# Patient Record
Sex: Female | Born: 1945 | Race: White | Hispanic: No | Marital: Married | Smoking: Never smoker
Health system: Southern US, Community
[De-identification: ages and names within clinical notes are randomized; demographics above are authoritative.]

## PROBLEM LIST (undated history)

## (undated) DIAGNOSIS — F419 Anxiety disorder, unspecified: Secondary | ICD-10-CM

## (undated) DIAGNOSIS — K529 Noninfective gastroenteritis and colitis, unspecified: Secondary | ICD-10-CM

## (undated) DIAGNOSIS — Z5189 Encounter for other specified aftercare: Secondary | ICD-10-CM

## (undated) DIAGNOSIS — K449 Diaphragmatic hernia without obstruction or gangrene: Secondary | ICD-10-CM

## (undated) DIAGNOSIS — E785 Hyperlipidemia, unspecified: Secondary | ICD-10-CM

## (undated) DIAGNOSIS — K219 Gastro-esophageal reflux disease without esophagitis: Secondary | ICD-10-CM

## (undated) DIAGNOSIS — C44300 Unspecified malignant neoplasm of skin of unspecified part of face: Secondary | ICD-10-CM

## (undated) DIAGNOSIS — L309 Dermatitis, unspecified: Secondary | ICD-10-CM

## (undated) DIAGNOSIS — K635 Polyp of colon: Secondary | ICD-10-CM

## (undated) DIAGNOSIS — L719 Rosacea, unspecified: Secondary | ICD-10-CM

## (undated) DIAGNOSIS — K5090 Crohn's disease, unspecified, without complications: Secondary | ICD-10-CM

## (undated) DIAGNOSIS — M1990 Unspecified osteoarthritis, unspecified site: Secondary | ICD-10-CM

## (undated) DIAGNOSIS — K5790 Diverticulosis of intestine, part unspecified, without perforation or abscess without bleeding: Secondary | ICD-10-CM

## (undated) DIAGNOSIS — T7840XA Allergy, unspecified, initial encounter: Secondary | ICD-10-CM

## (undated) DIAGNOSIS — G43909 Migraine, unspecified, not intractable, without status migrainosus: Secondary | ICD-10-CM

## (undated) DIAGNOSIS — I10 Essential (primary) hypertension: Secondary | ICD-10-CM

## (undated) DIAGNOSIS — K2970 Gastritis, unspecified, without bleeding: Secondary | ICD-10-CM

## (undated) DIAGNOSIS — M109 Gout, unspecified: Secondary | ICD-10-CM

## (undated) HISTORY — DX: Unspecified malignant neoplasm of skin of unspecified part of face: C44.300

## (undated) HISTORY — DX: Noninfective gastroenteritis and colitis, unspecified: K52.9

## (undated) HISTORY — DX: Diverticulosis of intestine, part unspecified, without perforation or abscess without bleeding: K57.90

## (undated) HISTORY — PX: POLYPECTOMY: SHX149

## (undated) HISTORY — DX: Rosacea, unspecified: L71.9

## (undated) HISTORY — DX: Anxiety disorder, unspecified: F41.9

## (undated) HISTORY — DX: Encounter for other specified aftercare: Z51.89

## (undated) HISTORY — DX: Gastritis, unspecified, without bleeding: K29.70

## (undated) HISTORY — DX: Hyperlipidemia, unspecified: E78.5

## (undated) HISTORY — DX: Dermatitis, unspecified: L30.9

## (undated) HISTORY — DX: Polyp of colon: K63.5

## (undated) HISTORY — DX: Gout, unspecified: M10.9

## (undated) HISTORY — DX: Crohn's disease, unspecified, without complications: K50.90

## (undated) HISTORY — PX: WISDOM TOOTH EXTRACTION: SHX21

## (undated) HISTORY — DX: Migraine, unspecified, not intractable, without status migrainosus: G43.909

## (undated) HISTORY — DX: Allergy, unspecified, initial encounter: T78.40XA

## (undated) HISTORY — DX: Essential (primary) hypertension: I10

## (undated) HISTORY — DX: Diaphragmatic hernia without obstruction or gangrene: K44.9

## (undated) HISTORY — PX: UPPER GI ENDOSCOPY: SHX6162

## (undated) HISTORY — DX: Unspecified osteoarthritis, unspecified site: M19.90

## (undated) HISTORY — PX: TONSILLECTOMY: SUR1361

## (undated) HISTORY — DX: Gastro-esophageal reflux disease without esophagitis: K21.9

## (undated) HISTORY — PX: DILATION AND CURETTAGE OF UTERUS: SHX78

## (undated) HISTORY — PX: BREAST EXCISIONAL BIOPSY: SUR124

## (undated) HISTORY — PX: UPPER GASTROINTESTINAL ENDOSCOPY: SHX188

---

## 1978-04-19 HISTORY — PX: BREAST BIOPSY: SHX20

## 1992-04-19 HISTORY — PX: TOTAL VAGINAL HYSTERECTOMY: SHX2548

## 1997-07-24 ENCOUNTER — Ambulatory Visit (HOSPITAL_COMMUNITY): Admission: RE | Admit: 1997-07-24 | Discharge: 1997-07-24 | Payer: Self-pay | Admitting: Obstetrics and Gynecology

## 1998-07-28 ENCOUNTER — Ambulatory Visit (HOSPITAL_COMMUNITY): Admission: RE | Admit: 1998-07-28 | Discharge: 1998-07-28 | Payer: Self-pay | Admitting: Obstetrics and Gynecology

## 1999-08-12 ENCOUNTER — Ambulatory Visit (HOSPITAL_COMMUNITY): Admission: RE | Admit: 1999-08-12 | Discharge: 1999-08-12 | Payer: Self-pay | Admitting: Obstetrics and Gynecology

## 1999-08-12 ENCOUNTER — Encounter: Payer: Self-pay | Admitting: Obstetrics and Gynecology

## 2000-08-15 ENCOUNTER — Ambulatory Visit (HOSPITAL_COMMUNITY): Admission: RE | Admit: 2000-08-15 | Discharge: 2000-08-15 | Payer: Self-pay | Admitting: Obstetrics and Gynecology

## 2000-08-15 ENCOUNTER — Encounter: Payer: Self-pay | Admitting: Obstetrics and Gynecology

## 2000-11-14 ENCOUNTER — Other Ambulatory Visit: Admission: RE | Admit: 2000-11-14 | Discharge: 2000-11-14 | Payer: Self-pay | Admitting: Obstetrics and Gynecology

## 2001-08-23 ENCOUNTER — Encounter: Payer: Self-pay | Admitting: Obstetrics and Gynecology

## 2001-08-23 ENCOUNTER — Ambulatory Visit (HOSPITAL_COMMUNITY): Admission: RE | Admit: 2001-08-23 | Discharge: 2001-08-23 | Payer: Self-pay | Admitting: Obstetrics and Gynecology

## 2002-09-04 ENCOUNTER — Encounter: Payer: Self-pay | Admitting: Obstetrics and Gynecology

## 2002-09-04 ENCOUNTER — Ambulatory Visit (HOSPITAL_COMMUNITY): Admission: RE | Admit: 2002-09-04 | Discharge: 2002-09-04 | Payer: Self-pay | Admitting: Obstetrics and Gynecology

## 2003-09-19 ENCOUNTER — Ambulatory Visit (HOSPITAL_COMMUNITY): Admission: RE | Admit: 2003-09-19 | Discharge: 2003-09-19 | Payer: Self-pay | Admitting: Obstetrics and Gynecology

## 2004-03-05 ENCOUNTER — Ambulatory Visit: Payer: Self-pay | Admitting: Family Medicine

## 2004-04-17 ENCOUNTER — Ambulatory Visit: Payer: Self-pay | Admitting: Family Medicine

## 2004-07-24 ENCOUNTER — Ambulatory Visit: Payer: Self-pay | Admitting: Family Medicine

## 2004-08-04 ENCOUNTER — Ambulatory Visit: Payer: Self-pay | Admitting: Gastroenterology

## 2004-09-23 ENCOUNTER — Ambulatory Visit (HOSPITAL_COMMUNITY): Admission: RE | Admit: 2004-09-23 | Discharge: 2004-09-23 | Payer: Self-pay | Admitting: Obstetrics and Gynecology

## 2004-10-12 ENCOUNTER — Ambulatory Visit: Payer: Self-pay | Admitting: Internal Medicine

## 2005-01-20 ENCOUNTER — Ambulatory Visit: Payer: Self-pay | Admitting: Internal Medicine

## 2005-01-27 ENCOUNTER — Ambulatory Visit: Payer: Self-pay | Admitting: Internal Medicine

## 2005-07-08 ENCOUNTER — Ambulatory Visit: Payer: Self-pay | Admitting: Gastroenterology

## 2005-08-16 ENCOUNTER — Ambulatory Visit: Payer: Self-pay | Admitting: Gastroenterology

## 2005-08-16 ENCOUNTER — Encounter (INDEPENDENT_AMBULATORY_CARE_PROVIDER_SITE_OTHER): Payer: Self-pay | Admitting: Specialist

## 2005-09-24 ENCOUNTER — Ambulatory Visit (HOSPITAL_COMMUNITY): Admission: RE | Admit: 2005-09-24 | Discharge: 2005-09-24 | Payer: Self-pay | Admitting: Obstetrics & Gynecology

## 2006-04-27 ENCOUNTER — Ambulatory Visit: Payer: Self-pay | Admitting: Internal Medicine

## 2006-04-27 LAB — CONVERTED CEMR LAB
Albumin: 4.2 g/dL (ref 3.5–5.2)
BUN: 9 mg/dL (ref 6–23)
CO2: 32 meq/L (ref 19–32)
Calcium: 9.3 mg/dL (ref 8.4–10.5)
Cholesterol: 160 mg/dL (ref 0–200)
Creatinine, Ser: 1 mg/dL (ref 0.4–1.2)
Eosinophil percent: 1.1 % (ref 0.0–5.0)
Glomerular Filtration Rate, Af Am: 72 mL/min/{1.73_m2}
HCT: 41 % (ref 36.0–46.0)
Hemoglobin: 13.8 g/dL (ref 12.0–15.0)
LDL Cholesterol: 91 mg/dL (ref 0–99)
Lymphocytes Relative: 20 % (ref 12.0–46.0)
MCHC: 33.5 g/dL (ref 30.0–36.0)
MCV: 93.8 fL (ref 78.0–100.0)
Monocytes Absolute: 0.6 10*3/uL (ref 0.2–0.7)
Monocytes Relative: 11.3 % — ABNORMAL HIGH (ref 3.0–11.0)
Platelets: 251 10*3/uL (ref 150–400)
Sodium: 145 meq/L (ref 135–145)
TSH: 2.15 microintl units/mL (ref 0.35–5.50)
Total Bilirubin: 0.8 mg/dL (ref 0.3–1.2)
Total Protein: 7.3 g/dL (ref 6.0–8.3)
Triglyceride fasting, serum: 118 mg/dL (ref 0–149)
VLDL: 24 mg/dL (ref 0–40)

## 2006-05-04 ENCOUNTER — Ambulatory Visit: Payer: Self-pay | Admitting: Internal Medicine

## 2006-08-02 ENCOUNTER — Ambulatory Visit: Payer: Self-pay | Admitting: Gastroenterology

## 2006-09-26 ENCOUNTER — Ambulatory Visit (HOSPITAL_COMMUNITY): Admission: RE | Admit: 2006-09-26 | Discharge: 2006-09-26 | Payer: Self-pay | Admitting: Obstetrics & Gynecology

## 2006-09-29 ENCOUNTER — Encounter: Admission: RE | Admit: 2006-09-29 | Discharge: 2006-09-29 | Payer: Self-pay | Admitting: Obstetrics & Gynecology

## 2006-12-19 LAB — CONVERTED CEMR LAB: Pap Smear: NORMAL

## 2007-04-05 ENCOUNTER — Telehealth: Payer: Self-pay | Admitting: Internal Medicine

## 2007-06-08 ENCOUNTER — Ambulatory Visit: Payer: Self-pay | Admitting: Internal Medicine

## 2007-06-08 LAB — CONVERTED CEMR LAB
Bilirubin Urine: NEGATIVE
Blood in Urine, dipstick: NEGATIVE
Specific Gravity, Urine: 1.015

## 2007-06-09 LAB — CONVERTED CEMR LAB
ALT: 33 units/L (ref 0–35)
AST: 29 units/L (ref 0–37)
Albumin: 4.2 g/dL (ref 3.5–5.2)
Basophils Absolute: 0 10*3/uL (ref 0.0–0.1)
Basophils Relative: 0 % (ref 0.0–1.0)
Calcium: 9.6 mg/dL (ref 8.4–10.5)
Cholesterol: 173 mg/dL (ref 0–200)
Eosinophils Absolute: 0.1 10*3/uL (ref 0.0–0.6)
GFR calc Af Amer: 82 mL/min
HDL: 49.5 mg/dL (ref >39.0)
Hemoglobin: 14 g/dL (ref 12.0–15.0)
LDL Cholesterol: 112 mg/dL — ABNORMAL HIGH (ref 0–99)
MCV: 95.1 fL (ref 78.0–100.0)
Monocytes Absolute: 0.5 10*3/uL (ref 0.2–0.7)
Monocytes Relative: 8.7 % (ref 3.0–11.0)
Neutrophils Relative %: 61.5 % (ref 43.0–77.0)
Platelets: 215 10*3/uL (ref 150–400)
Potassium: 4.3 meq/L (ref 3.5–5.1)
TSH: 2.24 microintl units/mL (ref 0.35–5.50)
Total Bilirubin: 0.9 mg/dL (ref 0.3–1.2)
Total Protein: 7.1 g/dL (ref 6.0–8.3)
Triglycerides: 59 mg/dL (ref 0–149)

## 2007-07-05 ENCOUNTER — Ambulatory Visit: Payer: Self-pay | Admitting: Internal Medicine

## 2007-07-05 DIAGNOSIS — E785 Hyperlipidemia, unspecified: Secondary | ICD-10-CM | POA: Insufficient documentation

## 2007-07-05 DIAGNOSIS — K219 Gastro-esophageal reflux disease without esophagitis: Secondary | ICD-10-CM

## 2007-07-05 DIAGNOSIS — G43909 Migraine, unspecified, not intractable, without status migrainosus: Secondary | ICD-10-CM | POA: Insufficient documentation

## 2007-07-05 DIAGNOSIS — K5090 Crohn's disease, unspecified, without complications: Secondary | ICD-10-CM | POA: Insufficient documentation

## 2007-07-05 DIAGNOSIS — L259 Unspecified contact dermatitis, unspecified cause: Secondary | ICD-10-CM

## 2007-07-05 DIAGNOSIS — J309 Allergic rhinitis, unspecified: Secondary | ICD-10-CM | POA: Insufficient documentation

## 2007-07-07 ENCOUNTER — Telehealth: Payer: Self-pay | Admitting: Internal Medicine

## 2007-08-10 ENCOUNTER — Ambulatory Visit: Payer: Self-pay | Admitting: Gastroenterology

## 2007-09-28 ENCOUNTER — Encounter: Admission: RE | Admit: 2007-09-28 | Discharge: 2007-09-28 | Payer: Self-pay | Admitting: Obstetrics & Gynecology

## 2008-07-22 DIAGNOSIS — K449 Diaphragmatic hernia without obstruction or gangrene: Secondary | ICD-10-CM | POA: Insufficient documentation

## 2008-07-23 ENCOUNTER — Ambulatory Visit: Payer: Self-pay | Admitting: Gastroenterology

## 2008-09-30 ENCOUNTER — Encounter: Admission: RE | Admit: 2008-09-30 | Discharge: 2008-09-30 | Payer: Self-pay | Admitting: Obstetrics & Gynecology

## 2008-10-07 ENCOUNTER — Ambulatory Visit: Payer: Self-pay | Admitting: Internal Medicine

## 2008-10-07 LAB — CONVERTED CEMR LAB
ALT: 24 units/L (ref 0–35)
Alkaline Phosphatase: 84 units/L (ref 39–117)
BUN: 26 mg/dL — ABNORMAL HIGH (ref 6–23)
Bilirubin Urine: NEGATIVE
Bilirubin, Direct: 0.1 mg/dL (ref 0.0–0.3)
CO2: 32 meq/L (ref 19–32)
Cholesterol: 144 mg/dL (ref 0–200)
Eosinophils Relative: 2.6 % (ref 0.0–5.0)
Glucose, Urine, Semiquant: NEGATIVE
HCT: 38.4 % (ref 36.0–46.0)
HDL: 50.2 mg/dL (ref >39.00)
Hemoglobin: 13.4 g/dL (ref 12.0–15.0)
Lymphs Abs: 1.3 10*3/uL (ref 0.7–4.0)
MCHC: 34.8 g/dL (ref 30.0–36.0)
MCV: 94.2 fL (ref 78.0–100.0)
Neutro Abs: 3.6 10*3/uL (ref 1.4–7.7)
Neutrophils Relative %: 62.2 % (ref 43.0–77.0)
Potassium: 4.9 meq/L (ref 3.5–5.1)
RBC: 4.08 M/uL (ref 3.87–5.11)
RDW: 11.7 % (ref 11.5–14.6)
Specific Gravity, Urine: 1.01
TSH: 3.52 microintl units/mL (ref 0.35–5.50)
Total Bilirubin: 0.9 mg/dL (ref 0.3–1.2)
WBC: 5.7 10*3/uL (ref 4.5–10.5)
pH: 7

## 2008-10-14 ENCOUNTER — Ambulatory Visit: Payer: Self-pay | Admitting: Internal Medicine

## 2008-10-14 DIAGNOSIS — G479 Sleep disorder, unspecified: Secondary | ICD-10-CM | POA: Insufficient documentation

## 2008-10-19 DIAGNOSIS — R519 Headache, unspecified: Secondary | ICD-10-CM | POA: Insufficient documentation

## 2008-10-19 DIAGNOSIS — R51 Headache: Secondary | ICD-10-CM

## 2008-12-24 ENCOUNTER — Encounter: Payer: Self-pay | Admitting: Internal Medicine

## 2009-07-17 ENCOUNTER — Ambulatory Visit: Payer: Self-pay | Admitting: Gastroenterology

## 2009-08-27 ENCOUNTER — Telehealth: Payer: Self-pay | Admitting: Gastroenterology

## 2009-08-29 ENCOUNTER — Encounter: Payer: Self-pay | Admitting: Gastroenterology

## 2009-10-02 ENCOUNTER — Encounter: Admission: RE | Admit: 2009-10-02 | Discharge: 2009-10-02 | Payer: Self-pay | Admitting: Obstetrics & Gynecology

## 2009-10-10 ENCOUNTER — Ambulatory Visit: Payer: Self-pay | Admitting: Internal Medicine

## 2009-10-10 LAB — CONVERTED CEMR LAB
BUN: 19 mg/dL (ref 6–23)
Basophils Relative: 1 % (ref 0.0–3.0)
Blood in Urine, dipstick: NEGATIVE
Chloride: 103 meq/L (ref 96–112)
Cholesterol: 152 mg/dL (ref 0–200)
Creatinine, Ser: 0.8 mg/dL (ref 0.4–1.2)
Eosinophils Absolute: 0.1 10*3/uL (ref 0.0–0.7)
Eosinophils Relative: 2.1 % (ref 0.0–5.0)
GFR calc non Af Amer: 80.08 mL/min (ref >60)
Glucose, Bld: 87 mg/dL (ref 70–99)
Glucose, Urine, Semiquant: NEGATIVE
HCT: 41.2 % (ref 36.0–46.0)
HDL: 44.3 mg/dL (ref >39.00)
Lymphs Abs: 1.6 10*3/uL (ref 0.7–4.0)
MCHC: 34.3 g/dL (ref 30.0–36.0)
MCV: 95.8 fL (ref 78.0–100.0)
Monocytes Relative: 9.4 % (ref 3.0–12.0)
Neutro Abs: 3.3 10*3/uL (ref 1.4–7.7)
Neutrophils Relative %: 59.7 % (ref 43.0–77.0)
Nitrite: NEGATIVE
TSH: 2.53 microintl units/mL (ref 0.35–5.50)
Total Protein: 6.9 g/dL (ref 6.0–8.3)
Triglycerides: 87 mg/dL (ref 0.0–149.0)
VLDL: 17.4 mg/dL (ref 0.0–40.0)
WBC: 5.6 10*3/uL (ref 4.5–10.5)
pH: 6.5

## 2009-10-17 ENCOUNTER — Ambulatory Visit: Payer: Self-pay | Admitting: Internal Medicine

## 2009-10-28 ENCOUNTER — Encounter: Payer: Self-pay | Admitting: Internal Medicine

## 2009-10-29 ENCOUNTER — Ambulatory Visit: Payer: Self-pay | Admitting: Internal Medicine

## 2009-10-29 DIAGNOSIS — H9319 Tinnitus, unspecified ear: Secondary | ICD-10-CM | POA: Insufficient documentation

## 2009-11-05 ENCOUNTER — Telehealth: Payer: Self-pay | Admitting: *Deleted

## 2009-11-05 ENCOUNTER — Telehealth (INDEPENDENT_AMBULATORY_CARE_PROVIDER_SITE_OTHER): Payer: Self-pay | Admitting: *Deleted

## 2009-11-10 ENCOUNTER — Telehealth: Payer: Self-pay | Admitting: *Deleted

## 2009-11-11 ENCOUNTER — Telehealth: Payer: Self-pay | Admitting: *Deleted

## 2009-11-12 ENCOUNTER — Telehealth: Payer: Self-pay | Admitting: *Deleted

## 2010-02-11 ENCOUNTER — Telehealth: Payer: Self-pay | Admitting: *Deleted

## 2010-05-10 ENCOUNTER — Encounter: Payer: Self-pay | Admitting: Obstetrics & Gynecology

## 2010-05-19 NOTE — Progress Notes (Signed)
Summary: Refill   Phone Note From Pharmacy   Summary of Call: Refill req from [facility] for Pentasa Initial call taken by: Ashok Cordia RN,  November 05, 2009 11:54 AM    Prescriptions: PENTASA 500 MG CPCR (MESALAMINE) 1 capsule by mouth twice a day  #180 x 4   Entered by:   Ashok Cordia RN   Authorized by:   Mardella Layman MD [facility]   Signed by:   Ashok Cordia RN on 11/05/2009   Method used:   Faxed to ...       [facility] (mail-order)             , [place]         Ph: [phone]       Fax: [phone]   RxID:   [id number]

## 2010-05-19 NOTE — Progress Notes (Signed)
Summary: rx refill  Phone Note Call from Patient   Caller: Patient Call For: Madelin Headings MD Reason for Call: Refill Medication Summary of Call: patient is calling because [facility] has not yet received rx.   Initial call taken by: Kern Reap CMA Duncan Dull),  November 10, 2009 3:00 PM  Follow-up for Phone Call        We faxed them on 7/21. Pt aware of this. Follow-up by: Romualdo Bolk, CMA Duncan Dull),  November 10, 2009 3:45 PM

## 2010-05-19 NOTE — Assessment & Plan Note (Signed)
Summary: cpx/cjr   Vital Signs:  Patient profile:   65 year old female Menstrual status:  hysterectomy Height:      66 inches Weight:      149 pounds Pulse rate:   88 / minute BP sitting:   150 / 72  (right arm) Cuff size:   regular  Vitals Entered By: Sherron Monday, CMA (AAMA) (October 17, 2009 8:43 AM)  Serial Vital Signs/Assessments:  Time      Position  BP       Pulse  Resp  Temp     By                     130/70                         Burnis Medin MD  Comments: right arm sitting By: Burnis Medin MD   CC: CPX without pap. Pt has gyn.   History of Present Illness: Bridget Reyes comes in today  for preventive visit  Since last visit  here  there have been no major changes in health status  .  Sleep: dr Earley Favor gave her Lorrin Mais    but new HA doc   wants PCP to prescribe .   LIPIDs: no se of meds  HAs  TCA   one  hs dry mouth    Crohns disease   yearly. check s currently .    Preventive Care Screening  Mammogram:    Date:  10/02/2009    Results:  normal   Prior Values:    Pap Smear:  normal (03/19/2008)    Mammogram:  normal (09/17/2008)    Colonoscopy:  Normal (08/16/2005)    Last Tetanus Booster:  Historical (10/18/2003)   Preventive Screening-Counseling & Management  Alcohol-Tobacco     Alcohol drinks/day: <1     Alcohol type: wine and vodka     Smoking Status: never     Passive Smoke Exposure: no  Caffeine-Diet-Exercise     Caffeine use/day: 0     Does Patient Exercise: yes     Type of exercise: curves     Times/week: 4  Hep-HIV-STD-Contraception     Dental Visit-last 6 months yes     Sun Exposure-Excessive: no  Safety-Violence-Falls     Seat Belt Use: yes     Firearms in the Home: firearms in the home     Firearm Counseling: not indicated; uses recommended firearm safety measures     Smoke Detectors: yes      Drug Use:  no.    Current Medications (verified): 1)  Claritin 10 Mg Tabs (Loratadine) .... Take 1 Tablet By Mouth Once A  Day As Needed 2)  Ambien 10 Mg Tabs (Zolpidem Tartrate) .... Take One By Mouth At Bedtime As Needed 3)  Estring 2 Mg Ring (Estradiol) .... Insert New Ring Every 3 Months 4)  Flexeril 10 Mg Tabs (Cyclobenzaprine Hcl) .... Take As Needed 5)  Flonase 50 Mcg/act Susp (Fluticasone Propionate) .... 2 Sprays Each Nares Q D 6)  Imitrex 100 Mg Tabs (Sumatriptan Succinate) .... Take One By Mouth As Needed 7)  Lidex 0.05 % Crea (Fluocinonide) .... Use As Directed Prn 8)  Pentasa 500 Mg Cpcr (Mesalamine) .Marland Kitchen.. 1 Capsule By Mouth Twice A Day 9)  Zocor 40 Mg Tabs (Simvastatin) .... Take 1 Tablet By Mouth Once A Day 10)  Atrovent 0.06 %  Soln (Ipratropium Bromide) .... 2sprays  Each Nares 2-3 X Per Day 11)  Multivitamins  Tabs (Multiple Vitamin) .... Take 1 Tablet By Mouth Once A Day 12)  Fish Oil  Oil (Fish Oil) .... Take One By Mouth Once Daily 13)  Amitriptyline Hcl 25 Mg Tabs (Amitriptyline Hcl) .... Take 1 Tabs By Mouth At Bedtime 14)  Melatonin 3 Mg Tabs (Melatonin) .... Take 1 Tab By Mouth At Bedtime 15)  [place]ips Colon Health  Caps (Probiotic Product) .... Take One By Mouth Once Daily 16)  Dexilant 60 Mg Cpdr (Dexlansoprazole) .Marland Kitchen.. 1 By Mouth Qd  Allergies (verified): 1)  ! Penicillin 2)  Bactrim 3)  Amoxicillin (Amoxicillin)  Past History:  Past medical, surgical, family and social histories (including risk factors) reviewed, and no changes noted (except as noted below).  Past Medical History: Reviewed history from 10/14/2008 and no changes required. GERD Hiatal Hernia Hyperlipidemia G2P2 Headache Migraine  Specialty care Eczema Crohns disease  ileal Allergic rhinitis Sleep problem  Past Surgical History: Reviewed history from 10/14/2008 and no changes required. Hysterectomy and bilateral Oophorectomy 89 Tonsillectomy 50 Breast Bx- Left 19880s  Past History:  Care Management: Gynecology: [facility] Gastroenterology: [place]  Family History: Reviewed  history from 10/14/2008 and no changes required. mom parkinsons   lung cancer  Father   cad   19s died in 29s chf Family History Hypertension Maternal aunt osteoporosis Daughter underactive  thyroid.   Sibs A&w  Family History of Breast Cancer: Pat Cousin No FH of Colon Cancer: Sister:   PAD     Social History: Reviewed history from 10/14/2008 and no changes required. Married, 2 girls  hhof 2  no pet Never Smoked Alcohol use-yes  social  Drug use-no Regular exercise-yes Daily Caffeine Use 2 cups coffee retired Education officer, [employer].  Review of Systems  The patient denies anorexia, fever, weight loss, weight gain, vision loss, hoarseness, chest pain, syncope, dyspnea on exertion, peripheral edema, prolonged cough, hemoptysis, abdominal pain, melena, hematochezia, severe indigestion/heartburn, hematuria, incontinence, muscle weakness, transient blindness, difficulty walking, depression, unusual weight change, abnormal bleeding, enlarged lymph nodes, angioedema, and breast masses.         some decrease    checked by ent in past for tinnitus and ok  Physical Exam General Appearance: well developed, well nourished, no acute distress Eyes: conjunctiva and lids normal, PERRLA, EOMI, WNL Ears, Nose, Mouth, Throat: TM clear, nares clear, oral exam WNL Neck: supple, no lymphadenopathy, no thyromegaly, no JVD Respiratory: clear to auscultation and percussion, respiratory effort normal Cardiovascular: regular rate and rhythm, S1-S2, no murmur, rub or gallop, no bruits, peripheral pulses normal and symmetric, no cyanosis, clubbing, edema or varicosities Chest: no scars, masses, tenderness; no asymmetry, skin changes, nipple discharge   Gastrointestinal: soft, non-tender; no hepatosplenomegaly, masses; active bowel sounds all quadrants,  Genitourinary: hyst  per gyne Lymphatic: no cervical, axillary or inguinal adenopathy Musculoskeletal: gait normal, muscle tone and strength WNL, no joint swelling,  effusions, discoloration, crepitus  Skin: clear, good turgor, color WNL, no rashes, lesions, or ulcerations Neurologic: normal mental status, normal reflexes, normal strength, sensation, and motion Psychiatric: alert; oriented to person, place and time Other Exam:  see labs normal   EKG sinus rhythm  slight  ns st junct depression  no acute changes .    review of old EKG show no sig change.     Impression & Recommendations:  Problem # 1:  PREVENTIVE HEALTH CARE (ICD-V70.0)  continue healthy lifestyle   zostavax NA today but  get when  avaialbe.      Orders: EKG w/ Interpretation (93000)  Problem # 2:  SLEEP DISORDER/DISTURBANCE (ICD-780.50) problematic   Discussed risk benefit  for ambien    will refill   Problem # 3:  HEADACHE (ICD-784.0) under specialty care  dry mouth from tca    Her updated medication list for this problem includes:    Imitrex 100 Mg Tabs (Sumatriptan succinate) .Marland Kitchen... Take one by mouth as needed  Problem # 4:  ALLERGIC RHINITIS (ICD-477.9) Assessment: Unchanged  Her updated medication list for this problem includes:    Claritin 10 Mg Tabs (Loratadine) .Marland Kitchen... Take 1 tablet by mouth once a day as needed    Flonase 50 Mcg/act Susp (Fluticasone propionate) .Marland Kitchen... 2 sprays each nares q d    Atrovent 0.06 % Soln (Ipratropium bromide) .Marland Kitchen... 2sprays each nares 2-3 x per day  Problem # 5:  HYPERLIPIDEMIA (ICD-272.4) Assessment: Unchanged  Her updated medication list for this problem includes:    Zocor 40 Mg Tabs (Simvastatin) .Marland Kitchen... Take 1 tablet by mouth once a day  Labs Reviewed: SGOT: 22 (10/10/2009)   SGPT: 24 (10/10/2009)   HDL:44.30 (10/10/2009), 50.20 (10/07/2008)  LDL:90 (10/10/2009), 86 (10/07/2008)  Chol:152 (10/10/2009), 144 (10/07/2008)  Trig:87.0 (10/10/2009), 39.0 (10/07/2008)  Orders: EKG w/ Interpretation (93000)  Problem # 6:  GERD (ICD-530.81)  per gi The following medications were removed from the medication list:    Omeprazole 20 Mg Cpdr  (Omeprazole) .Marland Kitchen... 1 each day 30 minutes before meal Her updated medication list for this problem includes:    Dexilant 60 Mg Cpdr (Dexlansoprazole) .Marland Kitchen... 1 by mouth qd  Diagnostics Reviewed:  EGD: Location: [place]   (08/16/2005) Discussed lifestyle modifications, diet, antacids/medications, and preventive measures. Handout provided.   Problem # 7:  CROHN'S DISEASE (ICD-555.9) Assessment: Comment Only  Complete Medication List: 1)  Claritin 10 Mg Tabs (Loratadine) .... Take 1 tablet by mouth once a day as needed 2)  Ambien 10 Mg Tabs (Zolpidem tartrate) .... Take one by mouth at bedtime as needed 3)  Estring 2 Mg Ring (Estradiol) .... Insert new ring every 3 months 4)  Flexeril 10 Mg Tabs (Cyclobenzaprine hcl) .... Take as needed 5)  Flonase 50 Mcg/act Susp (Fluticasone propionate) .... 2 sprays each nares q d 6)  Imitrex 100 Mg Tabs (Sumatriptan succinate) .... Take one by mouth as needed 7)  Lidex 0.05 % Crea (Fluocinonide) .... Use as directed prn 8)  Pentasa 500 Mg Cpcr (Mesalamine) .Marland Kitchen.. 1 capsule by mouth twice a day 9)  Zocor 40 Mg Tabs (Simvastatin) .... Take 1 tablet by mouth once a day 10)  Atrovent 0.06 % Soln (Ipratropium bromide) .... 2sprays each nares 2-3 x per day 11)  Multivitamins Tabs (Multiple vitamin) .... Take 1 tablet by mouth once a day 12)  Fish Oil Oil (Fish oil) .... Take one by mouth once daily 13)  Amitriptyline Hcl 25 Mg Tabs (Amitriptyline hcl) .... Take 1 tabs by mouth at bedtime 14)  Melatonin 3 Mg Tabs (Melatonin) .... Take 1 tab by mouth at bedtime 15)  [place] (Probiotic product) .... Take one by mouth once daily 16)  Dexilant 60 Mg Cpdr (Dexlansoprazole) .Marland Kitchen.. 1 by mouth qd  Patient Instructions: 1)  check BP readings    more frequently  in the next 2 weeks  and call if elevated . 2)  130/70 is your repeat reading today . 3)  contniue med s  as directed ' 4)  Ask headache  doc if  amitryptyline can be switched to  a different med  in class that could have less dry mouth.  5)  check up and labs in a year . 6)  call as needed .  7)  Can try  ambien 2-3 days in a row if needed.  Prescriptions: AMBIEN 10 MG TABS (ZOLPIDEM TARTRATE) take one by mouth at bedtime as needed  #30 x 2   Entered and Authorized by:   Burnis Medin MD   Signed by:   Burnis Medin MD on 10/17/2009   Method used:   Print then Give to Patient   RxID:   [id number] FLONASE 50 MCG/ACT SUSP (FLUTICASONE PROPIONATE) 2 sprays each nares q d  #3 months x 3   Entered and Authorized by:   Burnis Medin MD   Signed by:   Burnis Medin MD on 10/17/2009   Method used:   Electronically to        [facility]. [phone]* (retail)       [address]       [place], [place]  [address]       Ph: [phone]       Fax: [phone]   RxID:   [id number] ATROVENT 0.06 %  SOLN (IPRATROPIUM BROMIDE) 2sprays each nares 2-3 x per day  #3 x 3   Entered and Authorized by:   Burnis Medin MD   Signed by:   Burnis Medin MD on 10/17/2009   Method used:   Electronically to        [facility]. [phone]* (retail)       [address]       [place], [place]  [address]       Ph: [phone]       Fax: [phone]   RxID:   [id number] ZOCOR 40 MG TABS (SIMVASTATIN) Take 1 tablet by mouth once a day  #90 Tablet x 3   Entered and Authorized by:   Burnis Medin MD   Signed by:   Burnis Medin MD on 10/17/2009   Method used:   Electronically to        [facility]. [phone]* (retail)       [address]       [place], [place]  [address]       Ph: [phone]       Fax: [phone]   RxID:   [id number] LIDEX 0.05 % CREA (FLUOCINONIDE) use as directed prn  #30 grams x 2   Entered and Authorized by:   Burnis Medin MD   Signed by:   Burnis Medin MD on 10/17/2009   Method used:   Electronically to        [facility]. [phone]* (retail)       [address]       [place],   [address]       Ph: [phone]       Fax: [phone]   RxID:   [id number]

## 2010-05-19 NOTE — Therapy (Signed)
Summary: Hearing Test/[facility]  Hearing Test/[facility]   Imported By: Maryln Gottron 11/03/2009 12:23:19  _____________________________________________________________________  External Attachment:    Type:   Image     Comment:   External Document

## 2010-05-19 NOTE — Progress Notes (Signed)
Summary: Switching to [facility]  Phone Note Call from Patient Call back at [facility] Phone [phone]   Caller: Patient Summary of Call: FYI:  Insurance company asked me to call and inform you that I will be changing prescriiption carrier to [facility].  They will be sending the necessary information to you. Initial call taken by: Trixie Dredge,  November 05, 2009 9:15 AM

## 2010-05-19 NOTE — Progress Notes (Signed)
Summary: refill on ambien  Phone Note From Pharmacy   Caller: [facility] Reason for Call: Needs renewal Details for Reason: ambien 34m Initial call taken by: [place]herron [place](AAMA),  February 11, 2010 11:56 AM  Follow-up for Phone Call        ok to refill x 1 90 # Follow-up by: WBurnis MedinMD,  February 11, 2010 2:04 PM  Additional Follow-up for Phone Call Additional follow up Details #1::        Rx faxed to pharmacy. Additional Follow-up by: SSherron Monday CMA (Deborra Medina,  February 11, 2010 3:53 PM    Prescriptions: AMBIEN 10 MG TABS (ZOLPIDEM TARTRATE) take one by mouth at bedtime as needed  #90 x 1   Entered by:   SSherron Monday CMA (AAMA)   Authorized by:   WBurnis MedinMD   Signed by:   SSherron Monday CMA (AAMA) on 02/11/2010   Method used:   Handwritten   RxID::   [id number]

## 2010-05-19 NOTE — Medication Information (Signed)
Summary: Dexilant Approved/[facility]  Dexilant Approved/[facility]   Imported By: Sherian Rein 09/03/2009 14:35:43  _____________________________________________________________________  External Attachment:    Type:   Image     Comment:   External Document

## 2010-05-19 NOTE — Assessment & Plan Note (Signed)
Summary: zostavax/ssc   Nurse Visit   Hearing Screen  20db HL: Left  500 hz: 15db 1000 hz: 10db 2000 hz: 15db 4000 hz: 30db Right  500 hz: 15db 1000 hz: 15db 2000 hz: 25db 4000 hz: 50db   Hearing Testing Entered By: Romualdo Bolk, CMA (AAMA) (October 29, 2009 9:55 AM) See last visit   . about hearing   .  reviewed results face to face with patient.     Some high frequency   loss. to get full tsting if progressive signs. or problems .   WKP.  Allergies: 1)  ! Penicillin 2)  Bactrim 3)  Amoxicillin (Amoxicillin)   Immunizations Administered:  Zostavax # 1:    Vaccine Type: Zostavax    Site: right deltoid    Mfr: Merck    Dose: 0.5 ml    Route: [place]    Given by: Romualdo Bolk, CMA (AAMA)    Exp. Date: 11/12/2010    Lot #: [id number]  Orders Added: 1)  Zoster (Shingles) Vaccine Live [90736] 2)  Admin 1st Vaccine [90471] 3)  Est. Patient Level I [62952]

## 2010-05-19 NOTE — Progress Notes (Signed)
Summary: refills on zolpidem, ipratropium, flonase and simvastatin  Phone Note From Pharmacy   Caller: [facility] Reason for Call: Needs renewal Summary of Call: Zolpidem #90, simvastatin #90, flonase and ipratropium Initial call taken by: Romualdo Bolk, CMA (AAMA),  November 05, 2009 12:04 PM  Follow-up for Phone Call          zolpidem   refill for 6 months   rest  refill for 1 year  Follow-up by: Madelin Headings MD,  November 05, 2009 5:20 PM    Prescriptions: FLONASE 50 MCG/ACT SUSP (FLUTICASONE PROPIONATE) 2 sprays each nares q d  #3 months x 3   Entered by:   Romualdo Bolk, CMA (AAMA)   Authorized by:   Madelin Headings MD   Signed by:   Romualdo Bolk, CMA (AAMA) on 11/06/2009   Method used:   Electronically to        [facility]* (retail)             ,          Ph: [phone]       Fax: [phone]   RxID:   [id number] ATROVENT 0.06 %  SOLN (IPRATROPIUM BROMIDE) 2sprays each nares 2-3 x per day  #3 x 3   Entered by:   Romualdo Bolk, CMA (AAMA)   Authorized by:   Madelin Headings MD   Signed by:   Romualdo Bolk, CMA (AAMA) on 11/06/2009   Method used:   Electronically to        [facility]* (retail)             ,          Ph: [phone]       Fax: [phone]   RxID:   [id number] ZOCOR 40 MG TABS (SIMVASTATIN) Take 1 tablet by mouth once a day  #90 Tablet x 3   Entered by:   Romualdo Bolk, CMA (AAMA)   Authorized by:   Madelin Headings MD   Signed by:   Romualdo Bolk, CMA (AAMA) on 11/06/2009   Method used:   Electronically to        [facility]* (retail)             ,          Ph: [phone]       Fax: [phone]   RxID:   [id number] AMBIEN 10 MG TABS (ZOLPIDEM TARTRATE) take one by mouth at bedtime as needed  #90 x 1   Entered by:   Romualdo Bolk, CMA (AAMA)   Authorized by:   Madelin Headings MD   Signed by:   Romualdo Bolk, CMA (AAMA) on 11/06/2009   Method used:   Printed then faxed to ...  [facility] (mail-[facility])             , [place]         Ph: [phone]       Fax: [phone]   RxID:   [id number]

## 2010-05-19 NOTE — Assessment & Plan Note (Signed)
Summary: YEARLY CHECK UP...EM    History of Present Illness Visit Type: Follow-up Visit Primary GI MD: Bridget Blalock MD FACP [place] Primary Provider: Shanon Ace, MD Chief Complaint: Patient here for yearly follow up of her crohns and gerd. She states that she is doing good and having no problems.  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 She does have mild last and reflux despite daily omeprazole 20 mg. Last endoscopy and colonoscopy were 4 years ago. She currently denies abdominal pain, diarrhea, or systemic complaints. Review of labs from last summer showed these to be unremarkable. Previous B12 levels were normal.   GI Review of Systems      Denies abdominal pain, acid reflux, belching, bloating, chest pain, dysphagia with liquids, dysphagia with solids, heartburn, loss of appetite, nausea, vomiting, vomiting blood, weight loss, and  weight gain.        Denies anal fissure, black tarry stools, change in bowel habit, constipation, diarrhea, diverticulosis, fecal incontinence, heme positive stool, hemorrhoids, irritable bowel syndrome, jaundice, light color stool, liver problems, rectal bleeding, and  rectal pain.    Current Medications (verified): 1)  Claritin 10 Mg Tabs (Loratadine) .... Take 1 Tablet By Mouth Once A Day As Needed 2)  Ambien 10 Mg Tabs (Zolpidem Tartrate) .... Take One By Mouth At Bedtime As Needed 3)  Estring 2 Mg Ring (Estradiol) .... Insert New Ring Every 3 Months 4)  Flexeril 10 Mg Tabs (Cyclobenzaprine Hcl) .... Take As Needed 5)  Flonase 50 Mcg/act Susp (Fluticasone Propionate) .... 2 Sprays Each Nares Q D 6)  Imitrex 100 Mg Tabs (Sumatriptan Succinate) .... Take One By Mouth As Needed 7)  Lidex 0.05 % Crea (Fluocinonide) .... Use As Directed Prn 8)  Omeprazole 20 Mg  Cpdr (Omeprazole) .Marland Kitchen.. 1 Each Day 30 Minutes Before Meal 9)  Pentasa 500 Mg Cpcr (Mesalamine) .Marland Kitchen.. 1 Capsule By Mouth  Twice A Day 10)  Zocor 40 Mg Tabs (Simvastatin) .... Take 1 Tablet By Mouth Once A Day 11)  Atrovent 0.06 %  Soln (Ipratropium Bromide) .... 2sprays Each Nares 2-3 X Per Day 12)  Multivitamins  Tabs (Multiple Vitamin) .... Take 1 Tablet By Mouth Once A Day 13)  Fish Oil  Oil (Fish Oil) .... Take One By Mouth Once Daily 14)  Amitriptyline Hcl 25 Mg Tabs (Amitriptyline Hcl) .... Take 2 Tabs By Mouth At Bedtime 15)  Melatonin 3 Mg Tabs (Melatonin) .... Take 1 Tab By Mouth At Bedtime 16)  Phillips Colon Health  Caps (Probiotic Product) .... Take One By Mouth Once Daily  Allergies: 1)  ! Penicillin 2)  Bactrim 3)  Amoxicillin (Amoxicillin)  Past History:  Past medical, surgical, family and social histories (including risk factors) reviewed for relevance to current acute and chronic problems.  Past Medical History: Reviewed history from 10/14/2008 and no changes required. GERD Hiatal Hernia Hyperlipidemia G2P2 Headache Migraine  Specialty care Eczema Crohns disease  ileal Allergic rhinitis Sleep problem  Past Surgical History: Reviewed history from 10/14/2008 and no changes required. Hysterectomy and bilateral Oophorectomy 89 Tonsillectomy 50 Breast Bx- Left 19880s  Family History: Reviewed history from 10/14/2008 and no changes required. mom parkinsons   lung cancer  Father   cad   50s died in 27s chf Family History Hypertension Maternal aunt osteoporosis Daughter underactive  thyroid.   Sibs A&w  Family History of Breast Cancer: Pat Cousin No FH of Colon Cancer:  Social  History: Reviewed history from 10/14/2008 and no changes required. Married, 2 girls Never Smoked Alcohol use-yes  social  Drug use-no Regular exercise-yes Daily Caffeine Use 2 cups coffee retired Education officer, [employer].  Review of Systems       The patient complains of allergy/sinus, arthritis/joint pain, skin rash, and sleeping problems.  The patient denies anemia, anxiety-new, back pain, blood in urine,  breast changes/lumps, change in vision, confusion, cough, coughing up blood, depression-new, fainting, fatigue, fever, headaches-new, hearing problems, heart murmur, heart rhythm changes, itching, menstrual pain, muscle pains/cramps, night sweats, nosebleeds, pregnancy symptoms, shortness of breath, sore throat, swelling of feet/legs, swollen lymph glands, thirst - excessive , urination - excessive , urination changes/pain, urine leakage, vision changes, and voice change.    Vital Signs:  Patient profile:   65 year old female Menstrual status:  hysterectomy Height:      65.5 inches Weight:      145.0 pounds BMI:     23.85 Pulse rate:   92 / minute Pulse rhythm:   regular BP sitting:   122 / 62  (left arm) Cuff size:   regular  Vitals Entered By: Bridget Reyes CMA Bridget Reyes) (July 17, 2009 10:43 AM)  Physical Exam  General:  Well developed, well nourished, no acute distress.healthy appearing.  healthy appearing.   Head:  Normocephalic and atraumatic. Eyes:  PERRLA, no icterus.exam deferred to patient's ophthalmologist.  exam deferred to patient's ophthalmologist.   Lungs:  Clear throughout to auscultation. Heart:  Regular rate and rhythm; no murmurs, rubs,  or bruits. Abdomen:  Soft, nontender and nondistended. No masses, hepatosplenomegaly or hernias noted. Normal bowel sounds. Msk:  Symmetrical with no gross deformities. Normal posture. Extremities:  No clubbing, cyanosis, edema or deformities noted. Neurologic:  Alert and  oriented x4;  grossly normal neurologically. Psych:  Alert and cooperative. Normal mood and affect.   Impression & Recommendations:  Problem # 1:  CROHN'S DISEASE (ICD-555.9) Assessment Improved Continue Pentasa 500 mg twice a day. Colonoscopy followup due in spring of 2012. Yearly labs are done per primary care. They has been there past history of B12 deficiency.  Problem # 2:  GERD (ICD-530.81) Assessment: Deteriorated Change to  Dexilant 60 mg q.a.m. with  standard antireflux maneuvers.  Patient Instructions: 1)  Stop omeprazole. 2)  Start dexilant daily. 3)  Report back after a few weeks on how wel Dexilant is working.  4)  Ask for Butch Penny  [phone] 5)  The medication list was reviewed and reconciled.  All changed / newly prescribed medications were explained.  A complete medication list was provided to the patient / caregiver. 6)  Please schedule a follow-up appointment in 1 year. 7)  Colonoscopy As per Clinical Protocol Next Year. 8)  Copy sent to Dr. Shanon Reyes  9)  Please continue current medications.   Appended Document: YEARLY CHECK UP.Marland KitchenMarland KitchenEM    Clinical Lists Changes       Appended Document: YEARLY CHECK UP.Marland KitchenMarland KitchenEM    Clinical Lists Changes  Medications: Added new medication of DEXILANT 60 MG CPDR (DEXLANSOPRAZOLE) 1 by mouth qd

## 2010-05-19 NOTE — Miscellaneous (Signed)
Summary: Dexilant Approval   Clinical Lists Changes Case ID: [id number] Member Number: [id number] Case Type: Initial Review Case Start Date: 08/29/2009 Case Status: Coverage has been APPROVED. You will receive a confirmation letter confirming approval of this medication. The patient will also be notified of this approval via an automated outbound phone call or a letter. Please allow approximately 2 hours to update our system with the approval. Once updated, the prescription can be re-submitted.   Coverage Start Date: 08/08/2009 Coverage End Date: 08/29/2011  Patient First Name: Bridget Reyes Patient Last Name: [facility] DOB: 1945/10/25 Patient Street Address: [place]   Patient City: [place] Patient State: [place] Patient Zip: [phone]  Drug Name & Strength: Dexilant 60 Mg

## 2010-05-19 NOTE — Progress Notes (Signed)
Summary: [facility] refills  Phone Note Call from Patient   Caller: Patient Call For: Madelin Headings MD Reason for Call: Talk to Nurse, Talk to Doctor Details for Reason: refills Summary of Call: patient is calling because [facility] has informed her that all of her rx have to have "Nolene Ebbs" on them. the rx also have to be faxed seperate.  if you have any questions her number is [phone].  she will need refills for simvastatin, atrovent, flonase. Initial call taken by: Kern Reap CMA Duncan Dull),  November 11, 2009 3:02 PM  Follow-up for Phone Call        Rx faxed to pharmacy. Follow-up by: Romualdo Bolk, CMA (AAMA),  November 11, 2009 4:20 PM    Prescriptions: ATROVENT 0.06 %  SOLN (IPRATROPIUM BROMIDE) 2sprays each nares 2-3 x per day  #3 x 3   Entered by:   Romualdo Bolk, CMA (AAMA)   Authorized by:   Madelin Headings MD   Signed by:   Romualdo Bolk, CMA (AAMA) on 11/11/2009   Method used:   Printed then faxed to ...       [facility]* (retail)             ,          Ph: [phone]       Fax: [phone]   RxID:   [id number] ZOCOR 40 MG TABS (SIMVASTATIN) Take 1 tablet by mouth once a day  #90 Tablet x 3   Entered by:   Romualdo Bolk, CMA (AAMA)   Authorized by:   Madelin Headings MD   Signed by:   Romualdo Bolk, CMA (AAMA) on 11/11/2009   Method used:   Printed then faxed to ...       [facility]* (retail)             ,          Ph: [phone]       Fax: [phone]   RxID:   [id number] FLONASE 50 MCG/ACT SUSP (FLUTICASONE PROPIONATE) 2 sprays each nares q d  #3 months x 3   Entered by:   Romualdo Bolk, CMA (AAMA)   Authorized by:   Madelin Headings MD   Signed by:   Romualdo Bolk, CMA (AAMA) on 11/11/2009   Method used:   Printed then faxed to ...       [facility]* (retail)             ,          Ph: [phone]       Fax: [phone]   RxID:   [id number] AMBIEN 10 MG TABS (ZOLPIDEM TARTRATE) take one by mouth at  bedtime as needed  #90 x 1   Entered by:   Romualdo Bolk, CMA (AAMA)   Authorized by:   Madelin Headings MD   Signed by:   Romualdo Bolk, CMA (AAMA) on 11/11/2009   Method used:   Printed then faxed to ...       [facility]* (retail)             ,          Ph: [phone]       Fax: [phone]   RxID:   [id number]

## 2010-05-19 NOTE — Progress Notes (Signed)
Summary: [facility] called re: Ambien Script. Need clarification  Phone Note From Pharmacy Call back at [phone] option 2 [facility]    Ref # [id number]   Caller: [facility]   -    Amber Summary of Call: Need verification of pts name on Ambien.   Rcvd script for [facility]. Need to know if correct name. Pls resend script to [facility]. Fax [phone]      Ref # [id number]    Initial call taken by: Lucy Antigua,  November 12, 2009 1:25 PM  Follow-up for Phone Call        I called [facility] and told them that we re-fax this rx with pt's correct name on it yesterday. Follow-up by: Romualdo Bolk, CMA (AAMA),  November 12, 2009 1:29 PM

## 2010-05-19 NOTE — Progress Notes (Signed)
Summary: Wants to speak to nurse   Phone Note Call from Patient Call back at Home Phone [phone]   Call For: DR Jarold Motto Summary of Call: Wants to let you know how the samples worked. Initial call taken by: Leanor Kail [facility],  Aug 27, 2009 2:24 PM  Follow-up for Phone Call        Pt states the dexilant samples worked well for her.  Pt would like to have Rx called in.  May requeir prior authorization. Follow-up by: Ashok Cordia RN,  Aug 27, 2009 2:32 PM    Prescriptions: DEXILANT 60 MG CPDR (DEXLANSOPRAZOLE) 1 by mouth qd  #30 x 6   Entered by:   Ashok Cordia RN   Authorized by:   Mardella Layman MD [facility]   Signed by:   Ashok Cordia RN on 08/27/2009   Method used:   Electronically to        [facility]. [phone]* (retail)       [phone] [address]       [place], [place]  [address]       Ph: [phone]       Fax: [phone]   RxID:   [id number]

## 2010-06-03 ENCOUNTER — Other Ambulatory Visit: Payer: Self-pay | Admitting: Sports Medicine

## 2010-06-03 DIAGNOSIS — M25561 Pain in right knee: Secondary | ICD-10-CM

## 2010-06-09 ENCOUNTER — Ambulatory Visit
Admission: RE | Admit: 2010-06-09 | Discharge: 2010-06-09 | Disposition: A | Discharge status: Home / Self Care | Payer: Medicare Other | Source: Ambulatory Visit | Attending: Sports Medicine | Admitting: Sports Medicine

## 2010-06-09 DIAGNOSIS — M25561 Pain in right knee: Secondary | ICD-10-CM

## 2010-07-16 ENCOUNTER — Ambulatory Visit (INDEPENDENT_AMBULATORY_CARE_PROVIDER_SITE_OTHER): Payer: Medicare Other | Admitting: Gastroenterology

## 2010-07-16 ENCOUNTER — Encounter: Payer: Self-pay | Admitting: Gastroenterology

## 2010-07-16 DIAGNOSIS — M1990 Unspecified osteoarthritis, unspecified site: Secondary | ICD-10-CM

## 2010-07-16 DIAGNOSIS — K219 Gastro-esophageal reflux disease without esophagitis: Secondary | ICD-10-CM

## 2010-07-16 DIAGNOSIS — K5000 Crohn's disease of small intestine without complications: Secondary | ICD-10-CM

## 2010-07-16 DIAGNOSIS — K5090 Crohn's disease, unspecified, without complications: Secondary | ICD-10-CM

## 2010-07-16 MED ORDER — PEG-KCL-NACL-NASULF-NA ASC-C 100 G PO SOLR: 1.0000 | Freq: Once | ORAL | Status: AC

## 2010-07-16 MED ORDER — MESALAMINE ER 500 MG PO CPCR: 500.0000 mg | ORAL_CAPSULE | Freq: Four times a day (QID) | ORAL | Status: DC

## 2010-07-16 MED ORDER — OMEPRAZOLE 40 MG PO CPDR: 40.0000 mg | DELAYED_RELEASE_CAPSULE | Freq: Every day | ORAL | Status: DC

## 2010-07-16 MED ORDER — CELECOXIB 200 MG PO CAPS: 200.0000 mg | ORAL_CAPSULE | Freq: Two times a day (BID) | ORAL | Status: DC

## 2010-07-16 NOTE — Assessment & Plan Note (Signed)
We will switch her from [place] to omeprazole 40 mg every morning 30 minutes before breakfast with standard antireflux maneuvers.

## 2010-07-16 NOTE — Progress Notes (Signed)
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 She denies current abdominal pain, diarrhea, or other gastrointestinal issues. She does take daily PPI for acid reflux. She is followed closely by Dr. Regis Bill and has yearly labs and physical exams. Family history is noncontributory. He does take when necessary probiotic therapy. Recently she has had problems with arthritis in her neck and her right knee. She has been afraid to use anti-inflammatories because of her IBD.  Pertinent Review of Systems Negative: She denies scar cardiovascular, pulmonary, genitourinary or other systemic complaints. 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 He does suffer from frequent migraine headaches and is on when necessary Imitrex. She takes daily Zocor for hyperlipidemia.   Physical Exam: Well-developed well-nourished Caucasian female appearing younger than her stated age in no acute distress. Cannot appreciate stigmata of chronic liver disease. Her chest is clear and cardiac exam shows no murmurs gallops or rubs. Abdominal exam shows no organomegaly, masses or tenderness. Bowel sounds are normal. Peripheral extremities are unremarkable. Mental status is normal.    Assessment and Plan: She is in remission with her IBD a low dose amino salicylate therapy. She is due for followup colonoscopy which has been scheduled at her convenience. I prescribe Celebrex 200 mg one to 2 times a day for degenerative arthritis. Also because of insurance problems we have switched her from Dexilant 2 omeprazole 40 mg a day with standard antireflux maneuvers. I will leave lab data to Dr.Panosh at the time of her yearly checkup. This should include B12 level.

## 2010-07-16 NOTE — Assessment & Plan Note (Signed)
She is in remission on low-dose aminosalicylate therapy. Colonoscopy scheduled at her convenience and medications renewed.

## 2010-07-16 NOTE — Patient Instructions (Signed)
Your procedure has been scheduled for 08/24/2010 follow the seperate instructions.  Your prescription(s) have been sent to you pharmacy.  Stop your Dexilant and start Omeprazole

## 2010-08-21 ENCOUNTER — Encounter: Payer: Self-pay | Admitting: Gastroenterology

## 2010-08-24 ENCOUNTER — Encounter: Payer: Self-pay | Admitting: Gastroenterology

## 2010-08-24 ENCOUNTER — Ambulatory Visit (AMBULATORY_SURGERY_CENTER): Payer: Medicare Other | Admitting: Gastroenterology

## 2010-08-24 VITALS — BP 144/67 | HR 84 | Temp 97.6°F | Resp 24 | Ht 65.0 in | Wt 144.0 lb

## 2010-08-24 DIAGNOSIS — K5080 Crohn's disease of both small and large intestine without complications: Secondary | ICD-10-CM

## 2010-08-24 DIAGNOSIS — K5090 Crohn's disease, unspecified, without complications: Secondary | ICD-10-CM

## 2010-08-24 DIAGNOSIS — K5730 Diverticulosis of large intestine without perforation or abscess without bleeding: Secondary | ICD-10-CM

## 2010-08-24 DIAGNOSIS — K5289 Other specified noninfective gastroenteritis and colitis: Secondary | ICD-10-CM

## 2010-08-24 DIAGNOSIS — Z1211 Encounter for screening for malignant neoplasm of colon: Secondary | ICD-10-CM

## 2010-08-24 MED ORDER — SODIUM CHLORIDE 0.9 % IV SOLN: 500.0000 mL | INTRAVENOUS | Status: DC

## 2010-08-24 NOTE — Progress Notes (Signed)
PATIENTS BP DROPPED AFTER SEDATION TO 73/41  MD NOTIFIED, SKIN WARM AND DRY, IV FLUIDS OPEN WIDE. WILL CONTINUE TO MONITOR BP  BP 115/54 PRIOR TO LEAVING ROOM..IV FLUIDS REMAIN WIDE OPEN

## 2010-08-24 NOTE — Patient Instructions (Signed)
Please increase Pentasa- take 2 540m tablets three times a day Please review discharge instructions Please call Dr. PBuel Reamoffice in the new few days to set up follow up appointment for 6 weeks

## 2010-08-25 ENCOUNTER — Telehealth: Payer: Self-pay

## 2010-08-25 NOTE — Telephone Encounter (Signed)
No answer

## 2010-08-26 ENCOUNTER — Other Ambulatory Visit: Payer: Self-pay | Admitting: Obstetrics & Gynecology

## 2010-08-26 DIAGNOSIS — Z780 Asymptomatic menopausal state: Secondary | ICD-10-CM

## 2010-08-26 DIAGNOSIS — Z1231 Encounter for screening mammogram for malignant neoplasm of breast: Secondary | ICD-10-CM

## 2010-08-28 ENCOUNTER — Encounter: Payer: Self-pay | Admitting: Gastroenterology

## 2010-09-01 NOTE — Assessment & Plan Note (Signed)
[facility] OFFICE NOTE   NAME:LEBLANCZigmund Daniel                          MRN:          [id number]  DATE:08/10/2007                            DOB:          1945/08/27    The patient is a 65 year old white female with Crohn's ileitis in  remission on Pentasa 500 mg twice a day.  She also has chronic GERD and  takes currently Nexium 40 mg twice a day.  Most of her GERD symptoms  have been extraesophageal manifestations with hoarseness and coughing.  She is up to date on her endoscopy or and colonoscopy, both of which  were completed in May of 2007.  She has no family history of colon  polyps or colon cancer.   Her appetite is good.  Her weight is stable.  She is having no abdominal  pain or general medical problems.  She does have headaches and uses  p.r.n. Imitrex and also is on nasal sprays for allergic sinusitis.  She  takes currently Nexium 40 mg a day.  She is status post total abdominal  hysterectomy, bilateral oophorectomy.   PHYSICAL EXAMINATION:  GENERAL:  She is a healthy appearing white female  appearing younger than her stated age.  VITAL SIGNS:  She weighs 146 pounds and blood pressure 130/60, and pulse  64 and regular.  ABDOMEN:  Entirely benign without organomegaly, masses, or tenderness.  Bowel sounds were normal.  RECTAL:  Deferred.   ASSESSMENT:  1. Crohn's ileitis in remission.  2. Rule out B12 deficiency.  3. Chronic gastroesophageal reflux disease on proton pump inhibitor      therapy.  4. Status post hysterectomy and bilateral oophorectomy.  5. History of migraine headaches.   RECOMMENDATIONS:  1. Continue Pentasa therapy.  2. Switch to generic PPI therapy for insurance plan.  3. Check B12 level, and also vitamin D level per Dr. Regis Bill.  4. Yearly GI follow-up unless otherwise indicated.   The patient did show me a CBC and metabolic profile today per Dr. Regis Bill  and it was  normal.     Loralee Pacas. Sharlett Iles, MD, [place], [place]  Electronically Signed    DRP/MedQ  DD: 08/10/2007  DT: 08/10/2007  Job #: [id number]   cc:   Standley Brooking. Regis Bill, MD

## 2010-09-04 NOTE — Assessment & Plan Note (Signed)
[facility] OFFICE NOTE   NAME:LEBLANCZigmund Reyes                          MRN:          [id number]  DATE:08/02/2006                            DOB:          [birth year]    Bridget Reyes is doing extremely well and has had no real relapses of her ileitis  which was confirmed by colon biopsies a year ago.  She takes Pentasa 1  gram twice a day.  She also is on Nexium 40 mg a day for extra-  esophageal manifestations of GERD, being mostly chronic hoarseness.  She  wants to get off of both these medications at this time since she is  completely asymptomatic.  She denies any GI complaints whatsoever.   She weights 142 pounds and blood pressure is 116/70.  Pulse was 88 and  regular.  General physical examination was not performed.   RECOMMENDATIONS:  1. Continue Pentasa because the patient does have chronic ileitis.  2. I have asked her to taper off of Nexium over the next few weeks and      we will see how she does symptomatically.  3. P.r.n. GI followup as needed.     Loralee Pacas. Sharlett Iles, MD, [place], [place]  Electronically Signed    DRP/MedQ  DD: 08/02/2006  DT: 08/02/2006  Job #: [id number]

## 2010-10-05 ENCOUNTER — Ambulatory Visit
Admission: RE | Admit: 2010-10-05 | Discharge: 2010-10-05 | Disposition: A | Discharge status: Home / Self Care | Payer: Medicare Other | Source: Ambulatory Visit | Attending: Obstetrics & Gynecology | Admitting: Obstetrics & Gynecology

## 2010-10-05 DIAGNOSIS — Z780 Asymptomatic menopausal state: Secondary | ICD-10-CM

## 2010-10-05 DIAGNOSIS — Z1231 Encounter for screening mammogram for malignant neoplasm of breast: Secondary | ICD-10-CM

## 2010-10-06 ENCOUNTER — Encounter: Payer: Self-pay | Admitting: Gastroenterology

## 2010-10-06 ENCOUNTER — Ambulatory Visit (INDEPENDENT_AMBULATORY_CARE_PROVIDER_SITE_OTHER): Payer: Medicare Other | Admitting: Gastroenterology

## 2010-10-06 VITALS — BP 138/64 | HR 84 | Ht 65.5 in | Wt 146.2 lb

## 2010-10-06 DIAGNOSIS — M0760 Enteropathic arthropathies, unspecified site: Secondary | ICD-10-CM

## 2010-10-06 DIAGNOSIS — K639 Disease of intestine, unspecified: Secondary | ICD-10-CM

## 2010-10-06 DIAGNOSIS — K5080 Crohn's disease of both small and large intestine without complications: Secondary | ICD-10-CM

## 2010-10-06 MED ORDER — MESALAMINE ER 500 MG PO CPCR: ORAL_CAPSULE | ORAL | Status: DC

## 2010-10-06 MED ORDER — TRAMADOL HCL 50 MG PO TABS: 50.0000 mg | ORAL_TABLET | Freq: Four times a day (QID) | ORAL | Status: DC | PRN

## 2010-10-06 NOTE — Progress Notes (Addendum)
History of Present Illness: This is a 65 year old Caucasian female with chronic Crohn's ileocolitis currently in remission. However, colonoscopy has shown mild disease in her ileum and colon. We increased her Pentasa dose to 1 g 3 times a day. Review of her labs shows no specific abnormalities. She is followed medically by Dr. Fabian Sharp.    Current Medications, Allergies, Past Medical History, Past Surgical History, Family History and Social History were reviewed in [employer] record.   Assessment and plan: Chronic Crohn's disease in remission. She has had a benign clinical course progresses in 20 years, and the likely her for having a severe relapse is very low. I change her Pentasa to 1.5 g twice a day. She could not tolerate Celebrex for arthritis, and I have given her some tramadol 50 mg every 6-8 hours to try. Because of her ileocolitis she is not a candidate for NSAID therapy otherwise.  Please copy her primary care physician, referring physician, and pertinent subspecialists. No diagnosis found.

## 2010-10-06 NOTE — Patient Instructions (Signed)
Your prescription(s) have been sent to you pharmacy.  We have sent in Ultram to use as needed for abdominal pain.

## 2010-10-30 ENCOUNTER — Ambulatory Visit (INDEPENDENT_AMBULATORY_CARE_PROVIDER_SITE_OTHER): Payer: Medicare Other | Admitting: Internal Medicine

## 2010-10-30 ENCOUNTER — Encounter: Payer: Self-pay | Admitting: Internal Medicine

## 2010-10-30 VITALS — BP 130/72 | HR 78 | Ht 65.75 in | Wt 146.0 lb

## 2010-10-30 DIAGNOSIS — Z0000 Encounter for general adult medical examination without abnormal findings: Secondary | ICD-10-CM

## 2010-10-30 DIAGNOSIS — E785 Hyperlipidemia, unspecified: Secondary | ICD-10-CM

## 2010-10-30 DIAGNOSIS — M542 Cervicalgia: Secondary | ICD-10-CM

## 2010-10-30 DIAGNOSIS — R51 Headache: Secondary | ICD-10-CM

## 2010-10-30 DIAGNOSIS — M0760 Enteropathic arthropathies, unspecified site: Secondary | ICD-10-CM

## 2010-10-30 DIAGNOSIS — R252 Cramp and spasm: Secondary | ICD-10-CM

## 2010-10-30 DIAGNOSIS — K5080 Crohn's disease of both small and large intestine without complications: Secondary | ICD-10-CM

## 2010-10-30 DIAGNOSIS — J309 Allergic rhinitis, unspecified: Secondary | ICD-10-CM

## 2010-10-30 DIAGNOSIS — L259 Unspecified contact dermatitis, unspecified cause: Secondary | ICD-10-CM

## 2010-10-30 LAB — HEPATIC FUNCTION PANEL
AST: 33 U/L (ref 0–37)
Alkaline Phosphatase: 66 U/L (ref 39–117)
Total Bilirubin: 0.7 mg/dL (ref 0.3–1.2)

## 2010-10-30 LAB — BASIC METABOLIC PANEL
Calcium: 9.3 mg/dL (ref 8.4–10.5)
Creatinine, Ser: 0.7 mg/dL (ref 0.4–1.2)
GFR: 95.36 mL/min (ref >60.00)
Glucose, Bld: 93 mg/dL (ref 70–99)
Sodium: 140 mEq/L (ref 135–145)

## 2010-10-30 LAB — CBC WITH DIFFERENTIAL/PLATELET
Basophils Absolute: 0 10*3/uL (ref 0.0–0.1)
Basophils Relative: 0.7 % (ref 0.0–3.0)
Eosinophils Absolute: 0 10*3/uL (ref 0.0–0.7)
Lymphocytes Relative: 26 % (ref 12.0–46.0)
MCHC: 35.3 g/dL (ref 30.0–36.0)
Monocytes Absolute: 0.5 10*3/uL (ref 0.1–1.0)
Neutrophils Relative %: 62.4 % (ref 43.0–77.0)
Platelets: 209 10*3/uL (ref 150.0–400.0)
RBC: 4.28 Mil/uL (ref 3.87–5.11)

## 2010-10-30 LAB — MAGNESIUM: Magnesium: 2.5 mg/dL (ref 1.5–2.5)

## 2010-10-30 LAB — LIPID PANEL
HDL: 54.7 mg/dL (ref >39.00)
Total CHOL/HDL Ratio: 3
Triglycerides: 59 mg/dL (ref 0.0–149.0)
VLDL: 11.8 mg/dL (ref 0.0–40.0)

## 2010-10-30 MED ORDER — SIMVASTATIN 40 MG PO TABS: 40.0000 mg | ORAL_TABLET | Freq: Every day | ORAL | Status: DC

## 2010-10-30 MED ORDER — CYCLOBENZAPRINE HCL 10 MG PO TABS: 10.0000 mg | ORAL_TABLET | Freq: Two times a day (BID) | ORAL | Status: DC | PRN

## 2010-10-30 MED ORDER — IPRATROPIUM BROMIDE 0.06 % NA SOLN: NASAL | Status: DC

## 2010-10-30 MED ORDER — FLUTICASONE PROPIONATE 50 MCG/ACT NA SUSP: 2.0000 | Freq: Every day | NASAL | Status: DC

## 2010-10-30 MED ORDER — FLUOCINONIDE 0.05 % EX CREA: TOPICAL_CREAM | CUTANEOUS | Status: DC

## 2010-10-30 NOTE — Progress Notes (Signed)
Subjective:    Bridget Reyes is a 65 y.o. female who presents for a welcome to Medicare exam.  And disease management  No major change in health status since last visit . Increased on Pentasa. Cant take NSAID for pain given tramadol by dr Bridget Reyes but hasn't taken this yet  Had a knee problem . HA stable. No major injuries no hospitalizations  Cardiac risk factors: advanced age (older than 32 for men, 52 for women).  Activities of Daily Living  In your present state of health, do you have any difficulty performing the following activities?:  Preparing food and eating?: No Bathing yourself: No Getting dressed: No Using the toilet:No Moving around from place to place: No In the past year have you fallen or had a near fall?:No  Current exercise habits: Gym/ health club routine includes [employer].   Dietary issues discussed: healthy discussed   Depression Screen (Note: if answer to either of the following is "Yes", then a more complete depression screening is indicated)  Q1: Over the past two weeks, have you felt down, depressed or hopeless?no Q2: Over the past two weeks, have you felt little interest or pleasure in doing things? no     Hearing: Ok   Vision:  No limitations at present .  Safety:  Has smoke detector and wears seat belts.  No firearms. No excess sun exposure. Sees dentist regularly.  Falls: no  Tear knee cartilage right    Pushing vacuum cleaner.   Dr Bridget Reyes  Advance directive :  Reviewed  Has one.  Memory: Felt to be good  , no concern from her or her family.  Depression: No anhedonia unusual crying or depressive symptoms  Nutrition: Eats well balanced diet; adequate calcium and vitamin D. No swallowing chewiing problems.  ee hx of chrohs.  Injury: no major injuries in the last six months.  Other healthcare providers:  Reviewed today .  Social:  Lives with husband married. No pets.   Preventive parameters: up-to-date on colonoscopy, mammogram,  immunizations. Including Tdap and pneumovax.  ADLS:   There are no problems or need for assistance  driving, feeding, obtaining food, dressing, toileting and bathing, managing money using phone. She is independent. Neck arthritis  causing headaches  Review of Systems ROS:  GEN/ HEENTNo fever, significant weight changes sweats headaches vision problems hearing changes, CV/ PULM; No chest pain shortness of breath cough, syncope,edema  change in exercise tolerance. GI /GU: No adominal pain, vomiting, change in bowel habits. No blood in the stool. No significant GU symptoms. crohn's stable SKIN/HEME: ,no acute skin rashes suspicious lesions or bleeding. No lymphadenopathy, nodules, masses. Has hand eczema and uses topicals NEURO/ PSYCH:  No neurologic signs such as weakness numbness No depression anxiety. IMM/ Allergy: No unusual infections.  Allergy .   On meds  REST of 12 system review negative    Past history family history social history reviewed in the electronic medical record.  Objective:     Vision done by Dr. Hyacinth Reyes- September 18, 2010- Normal Blood pressure 130/72, pulse 78, height 5' 5.75" (1.67 m), weight 146 lb (66.225 kg). Body mass index is 23.74 kg/(m^2). Physical Exam: Vital signs reviewed [name] is a well-developed well-nourished alert cooperative  white female who appears her stated age in no acute distress.  HEENT: normocephalic  traumatic , Eyes: PERRL EOM's full, conjunctiva clear, Nares: paten,t no deformity discharge or tenderness., Ears: no deformity EAC's clear TMs with normal landmarks. Mouth: clear OP, no lesions, edema.  Moist mucous membranes. Dentition in adequate repair. NECK: supple without masses, thyromegaly or bruits. CHEST/PULM:  Clear to auscultation and percussion breath sounds equal no wheeze , rales or rhonchi. No chest wall deformities or tenderness. Breast: normal by inspection . No dimpling, discharge, masses, tenderness or discharge . LN: no cervical  axillary inguinal adenopathy CV: PMI is nondisplaced, S1 S2 no gallops, murmurs, rubs. Peripheral pulses are full without delay.No JVD .  ABDOMEN: Bowel sounds normal nontender  No guard or rebound, no hepato splenomegal no CVA tenderness.  No hernia. Extremtities:  No clubbing cyanosis or edema, no acute joint swelling or redness no focal atrophy leg cramps at night NEURO:  Oriented x3, cranial nerves 3-12 appear to be intact, no obvious focal weakness,gait within normal limits no abnormal reflexes or asymmetrical SKIN: No acute rashes normal turgor, color, no bruising or petechiae. PSYCH: Oriented, good eye contact, no obvious depression anxiety, cognition and judgment appear normal. EKG  No acute changes sinus rhythym    Assessment:  Preventive Health Care Counseled regarding healthy nutrition, exercise, sleep, injury prevention, calcium vit d and healthy weight .  Crohn's quiescent Neck pain and HA  Arthritis GERD Allergic rhinitis  Needs refills Hyperlipidemia  No se of meds  Hand eczema cream prn  Bone density  Low normal.  leg cramps    Plan:     During the course of the visit the patient was educated and counseled about appropriate screening and preventive services   Patient Instructions (the written plan) was given to the patient.   Medicare Attestation I have personally reviewed: The patient's medical and social history Their use of alcohol, tobacco or illicit drugs Their current medications and supplements The patient's functional ability including ADLs,fall risks, home safety risks, cognitive, and hearing and visual impairment Diet and physical activities Evidence for depression or mood disorders  The patient's weight, height, BMI, and visual acuity have been recorded in the chart.  I have made referrals, counseling, and provided education to the patient based on review of the above and I have provided the patient with a written personalized care plan for  preventive services.

## 2010-10-30 NOTE — Patient Instructions (Signed)
Okay to try Flexeril as needed for your neck spasm. Realize that Ambien tramadol and Flexeril can be dependent producing. Do not take them all at once [facility] let you know when laboratory studies are back Call when he needs refill of other medications. Continue healthy lifestyle. If laboratory studies are good we can see you in one year for a wellness visit and med check.

## 2010-10-31 ENCOUNTER — Encounter: Payer: Self-pay | Admitting: Internal Medicine

## 2010-11-02 ENCOUNTER — Telehealth: Payer: Self-pay | Admitting: *Deleted

## 2010-11-02 NOTE — Telephone Encounter (Signed)
error 

## 2010-11-03 ENCOUNTER — Telehealth: Payer: Self-pay | Admitting: *Deleted

## 2010-11-03 NOTE — Telephone Encounter (Signed)
Left message to call back  

## 2010-11-03 NOTE — Telephone Encounter (Signed)
Message copied by Romualdo Bolk on Tue Nov 03, 2010  2:38 PM ------      Message from: [facility], [place] [facility]      Created: Mon Nov 02, 2010  5:13 PM       Advise patient labs good except minor liver elevation.      We see this a lot .      No change in meds  But       Rec repeat   lfts in 2 -3 months avoid excess tylenol/ etoh in the meantime.

## 2010-11-04 NOTE — Telephone Encounter (Signed)
Left message to call back  

## 2010-11-05 NOTE — Telephone Encounter (Signed)
Pt aware of results 

## 2011-01-19 ENCOUNTER — Other Ambulatory Visit: Payer: Medicare Other

## 2011-01-21 ENCOUNTER — Other Ambulatory Visit (INDEPENDENT_AMBULATORY_CARE_PROVIDER_SITE_OTHER): Payer: Medicare Other

## 2011-01-21 DIAGNOSIS — K750 Abscess of liver: Secondary | ICD-10-CM

## 2011-01-21 LAB — HEPATIC FUNCTION PANEL
ALT: 23 U/L (ref 0–35)
AST: 26 U/L (ref 0–37)
Albumin: 4.6 g/dL (ref 3.5–5.2)
Alkaline Phosphatase: 83 U/L (ref 39–117)
Total Protein: 7.7 g/dL (ref 6.0–8.3)

## 2011-01-28 ENCOUNTER — Encounter: Payer: Self-pay | Admitting: *Deleted

## 2011-04-26 ENCOUNTER — Other Ambulatory Visit: Payer: Self-pay | Admitting: Internal Medicine

## 2011-05-12 DIAGNOSIS — N898 Other specified noninflammatory disorders of vagina: Secondary | ICD-10-CM | POA: Diagnosis not present

## 2011-05-18 ENCOUNTER — Telehealth: Payer: Self-pay | Admitting: Internal Medicine

## 2011-05-18 NOTE — Telephone Encounter (Signed)
Per Dr. Fabian Sharp- usually use oral valtrex for fever blister because it works better. Need pharmacy to call us with refill request for this medication

## 2011-05-18 NOTE — Telephone Encounter (Signed)
Pt called and has a fever blister. Pt said that a dermatologist prescribed generic Zovarex and pt is req to see if Dr Regis Bill could write a script for this med and call in to [place] [phone]

## 2011-05-19 MED ORDER — VALACYCLOVIR HCL 1 G PO TABS: ORAL_TABLET | ORAL | Status: DC

## 2011-05-19 NOTE — Telephone Encounter (Signed)
Spoke to pt- she would like for [place] to call in oral valtrex. Per Dr. Fabian Sharp- can call in 1000 mg take 2 every 12 hours as needed for cold sore #30 no refills  Rx sent to pharmacy. Pt aware of this.

## 2011-05-20 DIAGNOSIS — N952 Postmenopausal atrophic vaginitis: Secondary | ICD-10-CM | POA: Diagnosis not present

## 2011-05-31 DIAGNOSIS — Z124 Encounter for screening for malignant neoplasm of cervix: Secondary | ICD-10-CM | POA: Diagnosis not present

## 2011-05-31 DIAGNOSIS — Z01419 Encounter for gynecological examination (general) (routine) without abnormal findings: Secondary | ICD-10-CM | POA: Diagnosis not present

## 2011-06-11 ENCOUNTER — Encounter: Payer: Self-pay | Admitting: Internal Medicine

## 2011-06-11 ENCOUNTER — Ambulatory Visit (INDEPENDENT_AMBULATORY_CARE_PROVIDER_SITE_OTHER): Payer: Medicare Other | Admitting: Internal Medicine

## 2011-06-11 VITALS — BP 156/80 | HR 88 | Wt 148.0 lb

## 2011-06-11 DIAGNOSIS — K639 Disease of intestine, unspecified: Secondary | ICD-10-CM

## 2011-06-11 DIAGNOSIS — M0760 Enteropathic arthropathies, unspecified site: Secondary | ICD-10-CM | POA: Diagnosis not present

## 2011-06-11 DIAGNOSIS — R030 Elevated blood-pressure reading, without diagnosis of hypertension: Secondary | ICD-10-CM | POA: Diagnosis not present

## 2011-06-11 DIAGNOSIS — M25519 Pain in unspecified shoulder: Secondary | ICD-10-CM

## 2011-06-11 DIAGNOSIS — M25511 Pain in right shoulder: Secondary | ICD-10-CM

## 2011-06-11 NOTE — Patient Instructions (Addendum)
Since most of your blood pressures are normal and low normal.  Adding a blood pressure medication at this time may be dangerous and make your blood pressure too   Low.  If not too anxious then get some readings  No more than twice a day  But ok to do 3-4 x per week .  And then come back in in 2 weeks so we can recheck.  Ok to continue  aerobic exercise .     Avoid decongestant and excess alcohol.  Lot of salt and sodium .    Bring in machine to next visit.

## 2011-06-11 NOTE — Progress Notes (Signed)
Subjective:    Patient ID: Bridget Reyes, female    DOB: [birth year], 66 y.o.   MRN: [id number]  HPI Patient comes in today for SDA  For acute problem evaluation. She is getting some elevated BP readings over the last few weeks.  Feb 11  Was at [facility]  Office .  Marland Kitchen   Was at the gym   After exercising  Talking with someone    Had transient light headedness.  So took bp reading and was 150 rangeYesterday  Was 160 at times.   Similar to drug store.    But other readings were  Normal   .she brings in reading logsfor the last few days   No decongestants and no new supplements  But estring change to premarin cream.  Headaches no advil  Aleve type meds and no sig etoh.   Right shoulder bothering her  Since NOV  Doses weight ans could be related  painwith elevation and rotation no specific injury. No nsaid cause of her crohns Review of Systems No fever cp sob gi flare bleeding   Or edema no current sig HAs  Outpatient Encounter Prescriptions as of 06/11/2011  Medication Sig Dispense Refill  . Azelaic Acid (FINACEA) 15 % cream Apply topically 2 (two) times daily. After skin is thoroughly washed and patted dry, gently but thoroughly massage a thin film of azelaic acid cream into the affected area twice daily, in the morning and evening.      . conjugated estrogens (PREMARIN) vaginal cream Place vaginally daily.      . cyclobenzaprine (FLEXERIL) 10 MG tablet Take 1 tablet (10 mg total) by mouth 2 (two) times daily as needed for muscle spasms. Take by mouth as directed  30 tablet  1  . diphenhydrAMINE (BENADRYL) 25 MG tablet Take 25 mg by mouth at bedtime.        . fluocinonide (LIDEX) 0.05 % cream Use as directed when needed  30 g  3  . fluticasone (FLONASE) 50 MCG/ACT nasal spray Place 2 sprays into the nose daily.  16 g  11  . ipratropium (ATROVENT) 0.06 % nasal spray 2 sprays each nostril 2-3 times per day  15 mL  11  . mesalamine (PENTASA) 500 MG CR capsule Take three tablet by mouth twice a  day  540 capsule  3  . MULTIPLE VITAMIN PO Take 1 tablet by mouth daily.        . Omega-3 Fatty Acids (FISH OIL PO) Take 1 capsule by mouth daily.        Marland Kitchen omeprazole (PRILOSEC) 40 MG capsule Take 1 capsule (40 mg total) by mouth daily.  30 capsule  11  . Probiotic Product ([place]) CAPS Take 1 capsule by mouth daily.        . Probiotic Product (PROBIOTIC FORMULA) CAPS Take by mouth 1 dose over 46 hours.        . simvastatin (ZOCOR) 40 MG tablet take 1 tablet by mouth once daily at bedtime  90 tablet  1  . SUMAtriptan (IMITREX) 100 MG tablet Take by mouth as needed.        . valACYclovir (VALTREX) 1000 MG tablet 2 every 12 hours as needed for cold sores  30 tablet  0  . loratadine (CLARITIN) 10 MG tablet Take 10 mg by mouth daily as needed.        Marland Kitchen DISCONTD: estradiol (ESTRING) 2 MG vaginal ring Place 2 mg vaginally every 3 (three) months.  follow package directions       . DISCONTD: traMADol (ULTRAM) 50 MG tablet Take 1 tablet (50 mg total) by mouth every 6 (six) hours as needed for pain.  90 tablet  1  . DISCONTD: zolpidem (AMBIEN) 10 MG tablet Take 10 mg by mouth at bedtime as needed.         Past history family history social history reviewed in the electronic medical record.     Objective:   Physical Exam WDWN in nad looks well  HEENT: Normocephalic ;atraumatic , Eyes;  PERRL, EOMs  Full, lids and conjunctiva clear,,Ears: no deformities, canals nl, TM landmarks normal, Nose: no deformity or discharge  Mouth : OP clear without lesion or edema . Neck: Supple without adenopathy or masses or bruits Chest:  Clear to A&P without wheezes rales or rhonchi CV:  S1-S2 no gallops or murmurs peripheral perfusion is normal NEURO grossly non focal midly anxious. Shoulder pain with elevation nnl strength Reviewed log with her and majority of readings are 130/70 range anad lower same machine that registered high.    Assessment & Plan:  Elevated Bp readings    The majority of  readings  In her days recorded are in the 120 130 or lower range  An diastolic 50 - 70 seh does havea family hx of ht in sibs but has wel documented normals.  Suspect some anxiety is adding to the issue. No obv cv disease at this time.  Adding meds to day more risk than monitoring but disc  Monitoring anxiety phenomenon.   Do not see secondary causes. Plan for follow up and return.   Right shoulder  Poss  tendinitis not better .   Free weight only  And avoid.  Above head technique  See ortho SM    Total visit 37mns > 50% spent counseling and coordinating care

## 2011-06-12 DIAGNOSIS — M25511 Pain in right shoulder: Secondary | ICD-10-CM | POA: Insufficient documentation

## 2011-06-12 DIAGNOSIS — R030 Elevated blood-pressure reading, without diagnosis of hypertension: Secondary | ICD-10-CM | POA: Insufficient documentation

## 2011-06-25 ENCOUNTER — Encounter: Payer: Self-pay | Admitting: Internal Medicine

## 2011-06-25 ENCOUNTER — Ambulatory Visit (INDEPENDENT_AMBULATORY_CARE_PROVIDER_SITE_OTHER): Payer: Medicare Other | Admitting: Internal Medicine

## 2011-06-25 VITALS — BP 150/80 | HR 80 | Wt 147.0 lb

## 2011-06-25 DIAGNOSIS — G479 Sleep disorder, unspecified: Secondary | ICD-10-CM

## 2011-06-25 DIAGNOSIS — G478 Other sleep disorders: Secondary | ICD-10-CM | POA: Diagnosis not present

## 2011-06-25 DIAGNOSIS — R030 Elevated blood-pressure reading, without diagnosis of hypertension: Secondary | ICD-10-CM | POA: Diagnosis not present

## 2011-06-25 DIAGNOSIS — R002 Palpitations: Secondary | ICD-10-CM | POA: Diagnosis not present

## 2011-06-25 MED ORDER — ZOLPIDEM TARTRATE 10 MG PO TABS: 10.0000 mg | ORAL_TABLET | Freq: Every evening | ORAL | Status: DC | PRN

## 2011-06-25 NOTE — Patient Instructions (Addendum)
Care with the ambien  . FDA recommends  5 mg for women so can take 1/2 and repeat if needed Your bp readings at home are very good.   Still need confirmation that your machine readings at home correlate with in office readings.  See if can do at ortho office  If not then make appt with shannon for bp machine check.  Will contact you about  Echocardiogram test. And let you know results .

## 2011-06-26 DIAGNOSIS — R002 Palpitations: Secondary | ICD-10-CM | POA: Insufficient documentation

## 2011-06-26 NOTE — Progress Notes (Signed)
  Subjective:    Patient ID: Bridget Reyes, female    DOB: 1945-05-24, 66 y.o.   MRN: [id number]  HPI Pt comes in today for fu of elevated BP readings Her machine show almost all readings in the 937-902 range systolic and ocass 409 and 152 Forgot to bring in her machine.  Feel the same and   Thinks cause of WC anxiety.  Sleep still problematic and asks to get another [place] rx   Mind races at night sometimes med helps .   Review of Systems Neg cp sob gets ocass palpitation like  Skipped beat ? Related to using her estrogen cream the next day no associated sx. Otherwise.  Past history family history social history reviewed in the electronic medical record.     Objective:   Physical Exam WDWN in nad looks well CV:  S1-S2 no gallops or murmurs peripheral perfusion is normal  Bp right 140/78 One skipped beat.  Pulse 74  DATA REVIEWED:   BP readings and pulse      Assessment & Plan:  Elevated Bp readings   Again up in office    Still want confirmation that home readings are accurate. Will do this  Discussion.  Sleep   Disc  About use of meds    Dependency etc.  rx for now  Ocass premature beat by hx  ? Significance  ? If related to topical estrogen?  Will monitor and plan echo to r/o end organ changes  Regarding bp and palpitations. If ok then follow   Total visit 72mns > 50% spent counseling and coordinating care

## 2011-06-28 ENCOUNTER — Other Ambulatory Visit: Payer: Self-pay | Admitting: Orthopedic Surgery

## 2011-06-28 DIAGNOSIS — M249 Joint derangement, unspecified: Secondary | ICD-10-CM | POA: Diagnosis not present

## 2011-06-28 DIAGNOSIS — M25511 Pain in right shoulder: Secondary | ICD-10-CM

## 2011-06-28 DIAGNOSIS — M542 Cervicalgia: Secondary | ICD-10-CM | POA: Diagnosis not present

## 2011-06-29 ENCOUNTER — Ambulatory Visit: Payer: Medicare Other | Admitting: *Deleted

## 2011-06-29 ENCOUNTER — Ambulatory Visit (INDEPENDENT_AMBULATORY_CARE_PROVIDER_SITE_OTHER): Payer: Medicare Other | Admitting: Internal Medicine

## 2011-06-29 VITALS — BP 128/70 | HR 74 | Temp 97.6°F | Wt 148.0 lb

## 2011-06-29 DIAGNOSIS — R030 Elevated blood-pressure reading, without diagnosis of hypertension: Secondary | ICD-10-CM

## 2011-06-30 ENCOUNTER — Other Ambulatory Visit: Payer: Self-pay | Admitting: Gastroenterology

## 2011-07-01 ENCOUNTER — Other Ambulatory Visit: Payer: Self-pay

## 2011-07-01 ENCOUNTER — Ambulatory Visit (HOSPITAL_COMMUNITY): Payer: Medicare Other | Attending: Cardiology

## 2011-07-01 DIAGNOSIS — R002 Palpitations: Secondary | ICD-10-CM | POA: Diagnosis not present

## 2011-07-01 DIAGNOSIS — I519 Heart disease, unspecified: Secondary | ICD-10-CM | POA: Diagnosis not present

## 2011-07-01 DIAGNOSIS — R030 Elevated blood-pressure reading, without diagnosis of hypertension: Secondary | ICD-10-CM | POA: Insufficient documentation

## 2011-07-02 ENCOUNTER — Encounter: Payer: Self-pay | Admitting: *Deleted

## 2011-07-02 NOTE — Progress Notes (Signed)
Quick Note:    Letter sent to pt.  ______

## 2011-07-03 ENCOUNTER — Ambulatory Visit
Admission: RE | Admit: 2011-07-03 | Discharge: 2011-07-03 | Disposition: A | Discharge status: Home / Self Care | Payer: Medicare Other | Source: Ambulatory Visit | Attending: Orthopedic Surgery | Admitting: Orthopedic Surgery

## 2011-07-03 DIAGNOSIS — M25511 Pain in right shoulder: Secondary | ICD-10-CM

## 2011-07-03 DIAGNOSIS — M25519 Pain in unspecified shoulder: Secondary | ICD-10-CM | POA: Diagnosis not present

## 2011-07-04 NOTE — Patient Instructions (Signed)
Pt told to continue monitor and call if elevated ( see  Note)

## 2011-07-04 NOTE — Progress Notes (Signed)
  Subjective:    Patient ID: Bridget Reyes, female    DOB: 1945-10-28, 66 y.o.   MRN: [id number]  HPI Pt came in today for BP check with her own machine . No change in health. Was up some at ortho but didn't bring machine then getting good readings at home 120/ 130   Review of Systems     Objective:   Physical Exam WDWN in nad   Pt machine 155/68 in office and i got 146/70   Repeat 138/78 and 120/72       Assessment & Plan:  Elevated Bp readings    Up in office and consistent with theses readings come down with time.   CW [facility]    And normal readings other times  However  Still follow through with the echo cause of ocass palp and r/o end organ effect  Of above.   If ok then follow and  lsi

## 2011-07-04 NOTE — Assessment & Plan Note (Signed)
[facility] effect  Continue monitor and fu if not at goal   Proceed with echo for palpitation hx

## 2011-07-09 DIAGNOSIS — M249 Joint derangement, unspecified: Secondary | ICD-10-CM | POA: Diagnosis not present

## 2011-07-20 DIAGNOSIS — M249 Joint derangement, unspecified: Secondary | ICD-10-CM | POA: Diagnosis not present

## 2011-07-22 DIAGNOSIS — M249 Joint derangement, unspecified: Secondary | ICD-10-CM | POA: Diagnosis not present

## 2011-07-27 DIAGNOSIS — M249 Joint derangement, unspecified: Secondary | ICD-10-CM | POA: Diagnosis not present

## 2011-08-01 ENCOUNTER — Emergency Department (HOSPITAL_COMMUNITY)
Admission: EM | Admit: 2011-08-01 | Discharge: 2011-08-02 | Disposition: A | Discharge status: Home / Self Care | Payer: Medicare Other | Attending: Emergency Medicine | Admitting: Emergency Medicine

## 2011-08-01 ENCOUNTER — Encounter (HOSPITAL_COMMUNITY): Payer: Self-pay | Admitting: *Deleted

## 2011-08-01 DIAGNOSIS — Z79899 Other long term (current) drug therapy: Secondary | ICD-10-CM | POA: Insufficient documentation

## 2011-08-01 DIAGNOSIS — K219 Gastro-esophageal reflux disease without esophagitis: Secondary | ICD-10-CM | POA: Insufficient documentation

## 2011-08-01 DIAGNOSIS — H43399 Other vitreous opacities, unspecified eye: Secondary | ICD-10-CM | POA: Diagnosis not present

## 2011-08-01 DIAGNOSIS — E785 Hyperlipidemia, unspecified: Secondary | ICD-10-CM | POA: Insufficient documentation

## 2011-08-01 NOTE — ED Notes (Signed)
The  Pt had had flashes of light inher  Lt eye since yesterday.  She has a history of migraine headaches.  No headache.  She still has floaters

## 2011-08-01 NOTE — ED Notes (Signed)
Glasses: both: 15/20, right: 15/20, left: 20/20. Without glasses: both: 20/20, right: 25/20, left: 15/20.

## 2011-08-02 DIAGNOSIS — K219 Gastro-esophageal reflux disease without esophagitis: Secondary | ICD-10-CM | POA: Diagnosis not present

## 2011-08-02 DIAGNOSIS — E785 Hyperlipidemia, unspecified: Secondary | ICD-10-CM | POA: Diagnosis not present

## 2011-08-02 DIAGNOSIS — M249 Joint derangement, unspecified: Secondary | ICD-10-CM | POA: Diagnosis not present

## 2011-08-02 DIAGNOSIS — H2510 Age-related nuclear cataract, unspecified eye: Secondary | ICD-10-CM | POA: Diagnosis not present

## 2011-08-02 DIAGNOSIS — Z79899 Other long term (current) drug therapy: Secondary | ICD-10-CM | POA: Diagnosis not present

## 2011-08-02 DIAGNOSIS — H43399 Other vitreous opacities, unspecified eye: Secondary | ICD-10-CM | POA: Diagnosis not present

## 2011-08-02 DIAGNOSIS — H43819 Vitreous degeneration, unspecified eye: Secondary | ICD-10-CM | POA: Diagnosis not present

## 2011-08-02 NOTE — Discharge Instructions (Signed)
Eye Floaters A jelly-like fluid fills the inside of the eye and is called the vitreous. The vitreous is normally clear. It allows light to pass through to the back of the eye to the tissues that contain the nerves needed for vision (the retina). With age, the vitreous can start to decline. If a decline happens, specks of material from clumps of cells, blood, or other materials may start to float around inside the eye. These objects cast shadows on the retina. These shadows are seen as moving strings, streaks, "bugs," dust or spider webs floating in front of the eye. CAUSES   Age.   A high degree of near-sightedness (high myopia).   Tears in the retina.   Bleeding inside the eye from broken retinal blood vessels as a result of disease (diabetes, inflammation of the retinal blood vessels, and others).   Blood clot of the major vein of the retina or its branches (retinal vein occlusion).   Trauma.   Retinal detachment.   Vitreous detachment.   Eye surgery.   Inflammation inside the eye (uveitis).   Infection inside the eye.  SYMPTOMS   Seeing floating specs, dots or spider webs in the vision of one eye. This can sometimes be associated with flashes of light seen off to the side.   Bleeding in the eye may begin as floaters and lead to complete vision loss as the vitreous fills with blood. This may happen repeatedly in certain diseases of the blood vessels of the retina (e.g. diabetes).   If the vitreous shrinks enough to pull away from the retina (posterior vitreous detachment), a small circular ring-shaped floater may be seen.  Migraine headaches may be associated with many forms of visual symptoms (sparkling dots, wavy lines) just before the headache strikes. These symptoms due to migraine are not from floaters. They will disappear when the headache goes away. DIAGNOSIS  An eye professional can tell you if you have floaters during an eye exam. TREATMENT  There is no treatment for  the floaters themselves.  If the floaters are due to a tear in the retina, a retinal detachment or other eye disease, the condition that caused the floaters must be treated.   Floaters due to blood in the eye often go away or lessen with time.  SEEK MEDICAL CARE IF:   You suddenly see floating dots or spider webs in front of the vision of one or both eyes. This is especially true if you also see flashes of light off to the side (like flashes of lightening).   You see floaters and also notice a change or drop in your vision in either eye.  Document Released: 04/08/2003 Document Revised: 03/25/2011 Document Reviewed: 08/03/2007 [facility] Patient Information 2012 [place].

## 2011-08-02 NOTE — ED Provider Notes (Signed)
History     CSN: [id number]  Arrival date & time 08/01/11  2253   First MD Initiated Contact with Patient 08/01/11 2327      Chief Complaint  Patient presents with  . flashes in eye     (Consider location/radiation/quality/duration/timing/severity/associated sxs/prior treatment) Patient is a 66 y.o. female presenting with eye problem. The history is provided by the patient. No language interpreter was used.  Eye Problem  This is a new problem. The current episode started yesterday (30 hours ago). The problem occurs constantly. The problem has not changed since onset.There is pain in the left eye. There was no injury mechanism. The pain is at a severity of 0/10. The patient is experiencing no pain. History of Trauma: at 66 years old. There is no known exposure to pink eye. She does not wear contacts. Pertinent negatives include no blurred vision, no decreased vision, no discharge, no double vision, no foreign body sensation, no photophobia, no eye redness, no nausea, no vomiting, no tingling and no itching. She has tried nothing for the symptoms. The treatment provided no relief.  Flashes peripherally and floaters that the patient reports "look like bugs  Past Medical History  Diagnosis Date  . Hiatal hernia   . Esophageal reflux   . Migraines   . Hyperlipemia   . Crohn's   . Eczema   . Arthritis   . Cataract     Past Surgical History  Procedure Date  . Partial hysterectomy 1994    left oopherectomy   . Tonsillectomy   . Breast biopsy 1980    left    Family History  Problem Relation Age of Onset  . Parkinsonism Mother   . Lung cancer Mother   . Heart disease Father     died chf in 52 s  . Breast cancer      [employer]    History  Substance Use Topics  . Smoking status: Never Smoker   . Smokeless tobacco: Never Used  . Alcohol Use: 0.5 oz/week    1 drink(s) per week    OB History    Grav Para Term Preterm Abortions TAB SAB Ect Mult Living                   Review of Systems  Constitutional: Negative.   HENT: Negative for ear pain, facial swelling, neck pain and neck stiffness.   Eyes: Negative for blurred vision, double vision, photophobia, pain, discharge, redness and itching.  Respiratory: Negative.   Cardiovascular: Negative.   Gastrointestinal: Negative.  Negative for nausea and vomiting.  Genitourinary: Negative.   Musculoskeletal: Negative.   Skin: Negative.  Negative for itching.  Neurological: Negative for tingling and headaches.  Hematological: Negative.   Psychiatric/Behavioral: Negative.     Allergies  Amoxicillin; Lidocaine; Penicillins; and Sulfamethoxazole w/trimethoprim  Home Medications   Current Outpatient Rx  Name Route Sig Dispense Refill  . AZELAIC ACID 15 % EX GEL Topical Apply topically 2 (two) times daily. After skin is thoroughly washed and patted dry, gently but thoroughly massage a thin film of azelaic acid cream into the affected area twice daily, in the morning and evening.    Marland Kitchen ESTROGENS, CONJUGATED 0.625 MG/GM VA CREA Vaginal Place 0.5 g vaginally as directed. Use on Thursdays and Mondays    . CYCLOBENZAPRINE HCL 10 MG PO TABS Oral Take 10 mg by mouth 2 (two) times daily as needed. Take by mouth as directed for muscle soreness    . DIPHENHYDRAMINE  HCL 25 MG PO TABS Oral Take 25 mg by mouth at bedtime as needed. For sleep    . FLUOCINONIDE 0.05 % EX CREA Topical Apply 1 application topically daily as needed. Use as directed when needed for eczema    . FLUTICASONE PROPIONATE 50 MCG/ACT NA SUSP Nasal Place 2 sprays into the nose daily.    . IPRATROPIUM BROMIDE 0.06 % NA SOLN Nasal Place 2 sprays into the nose. 2 sprays each nostril 2-3 times per day    . LORATADINE 10 MG PO TABS Oral Take 10 mg by mouth daily as needed.      Marland Kitchen MESALAMINE ER 500 MG PO CPCR Oral Take 1,500 mg by mouth 2 (two) times daily. Take three tablet by mouth twice a day    . MULTIPLE VITAMIN PO Oral Take 1 tablet by mouth daily.      Marland Kitchen FISH OIL PO Oral Take 1 capsule by mouth daily.      Marland Kitchen OMEPRAZOLE 40 MG PO CPDR Oral Take 40 mg by mouth daily.    Marland Kitchen PHILLIPS COLON HEALTH PO CAPS Oral Take 1 capsule by mouth daily.      Marland Kitchen SIMVASTATIN 40 MG PO TABS Oral Take 40 mg by mouth every evening.    . SUMATRIPTAN SUCCINATE 100 MG PO TABS Oral Take 100 mg by mouth as needed. For Migraine    . ZOLPIDEM TARTRATE 10 MG PO TABS Oral Take 10 mg by mouth at bedtime as needed. For sleep      BP 144/64  Pulse 87  Temp(Src) 97.4 F (36.3 C) (Oral)  Resp 16  SpO2 98%  Physical Exam  Constitutional: She is oriented to person, place, and time. She appears well-developed and well-nourished. No distress.  HENT:  Head: Normocephalic and atraumatic.  Eyes: Conjunctivae and EOM are normal. Pupils are equal, round, and reactive to light. Right eye exhibits no chemosis, no discharge and no exudate. Left eye exhibits no chemosis, no discharge and no exudate.  Fundoscopic exam:      The right eye shows no AV nicking and no papilledema.       The left eye shows no AV nicking and no papilledema.  Slit lamp exam:      The right eye shows no hyphema.       The left eye shows no hyphema.       No free floating object on eye ultrasound by EDP  Neck: Neck supple.  Musculoskeletal: Normal range of motion.  Neurological: She is alert and oriented to person, place, and time.  Skin: Skin is warm and dry.  Psychiatric: She has a normal mood and affect.    ED Course  Procedures (including critical care time)  Labs Reviewed - No data to display No results found.   No diagnosis found.    MDM  1243 case d/w Dr. Satira Sark of optho, call in am at 830 to be seen immediately.  Patient and husband informed and agree to follow up       Kathrin Folden K Maryhelen Lindler-Rasch, MD 08/02/11 [id number]

## 2011-08-02 NOTE — ED Notes (Signed)
PT ambulated with a steady gait; VSS; A&Ox3; no signs of distress; respirations even and unlabored; skin warm and dry; no questions at this time.

## 2011-08-06 DIAGNOSIS — M7500 Adhesive capsulitis of unspecified shoulder: Secondary | ICD-10-CM | POA: Diagnosis not present

## 2011-08-13 ENCOUNTER — Encounter: Payer: Self-pay | Admitting: *Deleted

## 2011-08-17 ENCOUNTER — Encounter: Payer: Self-pay | Admitting: Gastroenterology

## 2011-08-17 ENCOUNTER — Ambulatory Visit (INDEPENDENT_AMBULATORY_CARE_PROVIDER_SITE_OTHER): Payer: Medicare Other | Admitting: Gastroenterology

## 2011-08-17 VITALS — BP 120/62 | HR 80 | Ht 65.5 in | Wt 142.5 lb

## 2011-08-17 DIAGNOSIS — K5080 Crohn's disease of both small and large intestine without complications: Secondary | ICD-10-CM

## 2011-08-17 DIAGNOSIS — K50813 Crohn's disease of both small and large intestine with fistula: Secondary | ICD-10-CM

## 2011-08-17 MED ORDER — OMEPRAZOLE 40 MG PO CPDR: 40.0000 mg | DELAYED_RELEASE_CAPSULE | Freq: Every day | ORAL | Status: DC

## 2011-08-17 MED ORDER — MESALAMINE ER 500 MG PO CPCR: ORAL_CAPSULE | ORAL | Status: DC

## 2011-08-17 NOTE — Patient Instructions (Signed)
Your prescription(s) have been sent to you pharmacy.  Follow up in one year.

## 2011-08-17 NOTE — Progress Notes (Signed)
This is a 66 year old Caucasian female with long-term Crohn's ileocolitis currently in remission on Pentasa 1.5 g twice a day. She is having regular bowel movements and denies abdominal pain, rectal bleeding, or any systemic complaints. Her appetite is good her weight is stable. She is up-to-date on her colonoscopy and laboratory parameters.  Current Medications, Allergies, Past Medical History, Past Surgical History, Family History and Social History were reviewed in [employer] record.  Pertinent Review of Systems Negative   Physical Exam: Blood pressure 120/62, pulse 80 and regular, weight 142 pounds and BMI 23.35. She is a healthy-appearing female in no distress. I cannot appreciate stigmata of chronic liver disease. There is no wall megaly, abdominal masses or tenderness. Bowel sounds are normal. Mental status is normal.    Assessment and Plan: Crohn's disease in remission onl oral aminosialyicates. Her medications were renewed, and we will see her on a yearly basis or when necessary as needed. No diagnosis found.

## 2011-08-30 DIAGNOSIS — H43819 Vitreous degeneration, unspecified eye: Secondary | ICD-10-CM | POA: Diagnosis not present

## 2011-08-30 DIAGNOSIS — H04129 Dry eye syndrome of unspecified lacrimal gland: Secondary | ICD-10-CM | POA: Diagnosis not present

## 2011-08-31 DIAGNOSIS — G43019 Migraine without aura, intractable, without status migrainosus: Secondary | ICD-10-CM | POA: Diagnosis not present

## 2011-09-10 ENCOUNTER — Other Ambulatory Visit: Payer: Self-pay | Admitting: Obstetrics & Gynecology

## 2011-09-10 DIAGNOSIS — Z1231 Encounter for screening mammogram for malignant neoplasm of breast: Secondary | ICD-10-CM

## 2011-09-29 DIAGNOSIS — N390 Urinary tract infection, site not specified: Secondary | ICD-10-CM | POA: Diagnosis not present

## 2011-10-12 ENCOUNTER — Ambulatory Visit
Admission: RE | Admit: 2011-10-12 | Discharge: 2011-10-12 | Disposition: A | Discharge status: Home / Self Care | Payer: Medicare Other | Source: Ambulatory Visit | Attending: Obstetrics & Gynecology | Admitting: Obstetrics & Gynecology

## 2011-10-12 DIAGNOSIS — Z1231 Encounter for screening mammogram for malignant neoplasm of breast: Secondary | ICD-10-CM | POA: Diagnosis not present

## 2011-10-26 ENCOUNTER — Other Ambulatory Visit: Payer: Self-pay | Admitting: Internal Medicine

## 2011-10-26 DIAGNOSIS — N390 Urinary tract infection, site not specified: Secondary | ICD-10-CM | POA: Diagnosis not present

## 2011-11-01 ENCOUNTER — Ambulatory Visit (INDEPENDENT_AMBULATORY_CARE_PROVIDER_SITE_OTHER): Payer: Medicare Other | Admitting: Internal Medicine

## 2011-11-01 ENCOUNTER — Encounter: Payer: Self-pay | Admitting: Internal Medicine

## 2011-11-01 ENCOUNTER — Other Ambulatory Visit: Payer: Self-pay | Admitting: Family Medicine

## 2011-11-01 VITALS — BP 150/80 | HR 74 | Temp 97.9°F | Ht 65.5 in | Wt 139.0 lb

## 2011-11-01 DIAGNOSIS — K5090 Crohn's disease, unspecified, without complications: Secondary | ICD-10-CM

## 2011-11-01 DIAGNOSIS — Z0000 Encounter for general adult medical examination without abnormal findings: Secondary | ICD-10-CM | POA: Diagnosis not present

## 2011-11-01 DIAGNOSIS — K639 Disease of intestine, unspecified: Secondary | ICD-10-CM

## 2011-11-01 DIAGNOSIS — G479 Sleep disorder, unspecified: Secondary | ICD-10-CM

## 2011-11-01 DIAGNOSIS — Z23 Encounter for immunization: Secondary | ICD-10-CM | POA: Diagnosis not present

## 2011-11-01 DIAGNOSIS — J309 Allergic rhinitis, unspecified: Secondary | ICD-10-CM

## 2011-11-01 DIAGNOSIS — R030 Elevated blood-pressure reading, without diagnosis of hypertension: Secondary | ICD-10-CM

## 2011-11-01 DIAGNOSIS — K5080 Crohn's disease of both small and large intestine without complications: Secondary | ICD-10-CM | POA: Diagnosis not present

## 2011-11-01 DIAGNOSIS — E785 Hyperlipidemia, unspecified: Secondary | ICD-10-CM | POA: Diagnosis not present

## 2011-11-01 DIAGNOSIS — L989 Disorder of the skin and subcutaneous tissue, unspecified: Secondary | ICD-10-CM

## 2011-11-01 LAB — HEPATIC FUNCTION PANEL
ALT: 28 U/L (ref 0–35)
AST: 27 U/L (ref 0–37)
Total Bilirubin: 0.6 mg/dL (ref 0.3–1.2)
Total Protein: 7.4 g/dL (ref 6.0–8.3)

## 2011-11-01 LAB — BASIC METABOLIC PANEL
BUN: 15 mg/dL (ref 6–23)
Chloride: 102 mEq/L (ref 96–112)
GFR: 96.75 mL/min (ref >60.00)
Potassium: 3.8 mEq/L (ref 3.5–5.1)
Sodium: 141 mEq/L (ref 135–145)

## 2011-11-01 LAB — CBC WITH DIFFERENTIAL/PLATELET
Basophils Relative: 0.6 % (ref 0.0–3.0)
Eosinophils Relative: 0.5 % (ref 0.0–5.0)
HCT: 41.7 % (ref 36.0–46.0)
Hemoglobin: 14.3 g/dL (ref 12.0–15.0)
Lymphs Abs: 1.2 10*3/uL (ref 0.7–4.0)
MCV: 93.5 fl (ref 78.0–100.0)
Monocytes Absolute: 0.4 10*3/uL (ref 0.1–1.0)
Monocytes Relative: 8.6 % (ref 3.0–12.0)
Neutro Abs: 3.1 10*3/uL (ref 1.4–7.7)
Platelets: 208 10*3/uL (ref 150.0–400.0)
RBC: 4.46 Mil/uL (ref 3.87–5.11)
WBC: 4.8 10*3/uL (ref 4.5–10.5)

## 2011-11-01 LAB — LIPID PANEL
Cholesterol: 176 mg/dL (ref 0–200)
LDL Cholesterol: 100 mg/dL — ABNORMAL HIGH (ref 0–99)
Triglycerides: 100 mg/dL (ref 0.0–149.0)

## 2011-11-01 MED ORDER — FLUTICASONE PROPIONATE 50 MCG/ACT NA SUSP: 2.0000 | Freq: Every day | NASAL | Status: DC

## 2011-11-01 MED ORDER — CYCLOBENZAPRINE HCL 10 MG PO TABS: 10.0000 mg | ORAL_TABLET | Freq: Two times a day (BID) | ORAL | Status: DC | PRN

## 2011-11-01 MED ORDER — IPRATROPIUM BROMIDE 0.06 % NA SOLN: NASAL | Status: DC

## 2011-11-01 MED ORDER — ZOLPIDEM TARTRATE 10 MG PO TABS: 10.0000 mg | ORAL_TABLET | Freq: Every evening | ORAL | Status: DC | PRN

## 2011-11-01 MED ORDER — SIMVASTATIN 40 MG PO TABS: 40.0000 mg | ORAL_TABLET | Freq: Every day | ORAL | Status: DC

## 2011-11-01 NOTE — Patient Instructions (Signed)
Will notify you  of labs when available. Will plan on sending you a copy also. Continue lifestyle intervention healthy eating and exercise . Get derm to check area on face.   Monitor your Bp readings also  To ensure control under 140/90 .   If all ok then check up in a year.

## 2011-11-01 NOTE — Progress Notes (Signed)
Subjective:    Patient ID: Bridget Reyes, female    DOB: 10-21-1945, 66 y.o.   MRN: [id number]  HPI Patient comes in today for preventive visit and follow-up of medical issues. Update  history since  last visit: No major change in health status since last visit . Did have an urgent eye evaluation for flashing light felt to  Vitreal separation and under monitoring.  Saw Dr Burgess Estelle frozen shoulder; had pt HAs ;stable on imitrex rarely  GI ;crohns stable no recent flare. GERD stable  Had uti rx by Dr Hyacinth Meeker with macrobid LIPIDS on simva no se  Reported    Hearing:  Ok has tinnitus  Vision:  No limitations at present .  Safety:  Has smoke detector and wears seat belts.  No firearms. No excess sun exposure. Sees dentist regularly.  Falls: none  Advance directive :  Reviewed  .  Memory: Felt to be good  , no concern from her or her family.  Depression: No anhedonia unusual crying or depressive symptoms  Nutrition: Eats well balanced diet; adequate calcium and vitamin D. No swallowing chewiing problems.  Injury: no major injuries in the last six months.  Other healthcare providers:  Reviewed today .  Social:  Lives with husband married. No pets.   Preventive parameters: up-to-date on colonoscopy, mammogram, immunizations. Including Tdap and pneumovax.  ADLS:   There are no problems or need for assistance  driving, feeding, obtaining food, dressing, toileting and bathing, managing money using phone. She is independent.  Exercise curves walk bike  60 minutes at a tie  Social etoh no tob   Review of Systems  ROS:  GEN/ HEENT: No fever, significant weight changes sweats  hearing changes, CV/ PULM; No chest pain shortness of breath cough, syncope,edema  change in exercise tolerance. GI /GU: No adominal pain, vomiting, change in bowel habits. No blood in the stool.  SKIN/HEME: ,no acute skin rashesor bleeding. No lymphadenopathy, nodules, masses.   Has a dry patch on face  left for months and concerned NEURO/ PSYCH:  No neurologic signs such as weakness numbness. No depression anxiety. IMM/ Allergy: No unusual infections.  Allergy .   REST of 12 system review negative except as per HPI  Outpatient Encounter Prescriptions as of 11/01/2011  Medication Sig Dispense Refill  . Azelaic Acid (FINACEA) 15 % cream Apply topically 2 (two) times daily. After skin is thoroughly washed and patted dry, gently but thoroughly massage a thin film of azelaic acid cream into the affected area twice daily, in the morning and evening.      . conjugated estrogens (PREMARIN) vaginal cream Place 0.5 g vaginally as directed. Use on Thursdays and Mondays      . cyclobenzaprine (FLEXERIL) 10 MG tablet Take 10 mg by mouth 2 (two) times daily as needed. Take by mouth as directed for muscle soreness      . diphenhydrAMINE (BENADRYL) 25 MG tablet Take 25 mg by mouth at bedtime as needed. For sleep      . fluocinonide cream (LIDEX) 0.05 % Apply 1 application topically daily as needed. Use as directed when needed for eczema      . fluticasone (FLONASE) 50 MCG/ACT nasal spray Place 2 sprays into the nose daily.      Marland Kitchen ipratropium (ATROVENT) 0.06 % nasal spray Place 2 sprays into the nose. 2 sprays each nostril 2-3 times per day      . loratadine (CLARITIN) 10 MG tablet Take 10 mg by mouth  daily as needed.        . mesalamine (PENTASA) 500 MG CR capsule Take three tablet by mouth twice a day  540 capsule  3  . MULTIPLE VITAMIN PO Take 1 tablet by mouth daily.       . Omega-3 Fatty Acids (FISH OIL PO) Take 1 capsule by mouth daily.        Marland Kitchen omeprazole (PRILOSEC) 40 MG capsule Take 1 capsule (40 mg total) by mouth daily.  90 capsule  3  . Probiotic Product (PHILLIPS COLON HEALTH) CAPS Take 1 capsule by mouth daily.        . simvastatin (ZOCOR) 40 MG tablet take 1 tablet by mouth once daily at bedtime  90 tablet  1  . SUMAtriptan (IMITREX) 100 MG tablet Take 100 mg by mouth as needed. For Migraine        . zolpidem (AMBIEN) 10 MG tablet Take 10 mg by mouth at bedtime as needed. For sleep       Past history family history social history reviewed in the electronic medical record.      Objective:   Physical Exam BP 150/80  Pulse 74  Temp 97.9 F (36.6 C) (Oral)  Ht 5' 5.5" (1.664 m)  Wt 139 lb (63.05 kg)  BMI 22.78 kg/m2  SpO2 98% Physical Exam: Vital signs reviewed [id number] is a well-developed well-nourished alert cooperative  white female who appears her stated age in no acute distress.  HEENT: normocephalic atraumatic , Eyes: PERRL EOM's full, conjunctiva clear, Nares: paten,t no deformity discharge or tenderness., Ears: no deformity EAC's clear TMs with normal landmarks. Mouth: clear OP, no lesions, edema.  Moist mucous membranes. Dentition in adequate repair. NECK: supple without masses, thyromegaly or bruits. CHEST/PULM:  Clear to auscultation and percussion breath sounds equal no wheeze , rales or rhonchi. No chest wall deformities or tenderness. Breast: normal by inspection . No dimpling, discharge, masses, tenderness or discharge .  CV: PMI is nondisplaced, S1 S2 no gallops, murmurs, rubs. Peripheral pulses are full without delay.No JVD .  ABDOMEN: Bowel sounds normal nontender  No guard or rebound, no hepato splenomegal no CVA tenderness.  No hernia. Extremtities:  No clubbing cyanosis or edema, no acute joint swelling or redness no focal atrophy NEURO:  Oriented x3, cranial nerves 3-12 appear to be intact, no obvious focal weakness,gait within normal limits no abnormal reflexes or asymmetrical SKIN: No acute rashes normal turgor, color, no bruising or petechiae.  Left face small 2 mm rough patch left cheek near nose  PSYCH: Oriented, good eye contact, no obvious depression anxiety, cognition and judgment appear normal. LN: no cervical axillary inguinal adenopathy Oriented x 3 and no noted deficits in memory, attention, and speech.     Assessment & Plan:  Preventive Health  Care Counseled regarding healthy nutrition, exercise, sleep, injury prevention, calcium vit d and healthy weight . Elevated Bp check at home had echo   2013 for palp and normal lv mild min diastolic dysfunction Lipids  Check today Gi IBD and gerd stable MS shoulder  Stable  Takes prn cyclobenzaprine ocass  Se risk disc  Allergy stable refill meds Has per dr Neale Burly Skin  Area on face may be insig but uncertain behavior so have dr [place] check this Sleep  Risk benefit of medication discussed.  None with etoh or  musc relaxants

## 2011-11-03 ENCOUNTER — Encounter: Payer: Self-pay | Admitting: Family Medicine

## 2011-11-07 ENCOUNTER — Encounter: Payer: Self-pay | Admitting: Internal Medicine

## 2011-11-07 DIAGNOSIS — L989 Disorder of the skin and subcutaneous tissue, unspecified: Secondary | ICD-10-CM | POA: Insufficient documentation

## 2011-11-07 DIAGNOSIS — Z0000 Encounter for general adult medical examination without abnormal findings: Secondary | ICD-10-CM | POA: Insufficient documentation

## 2011-12-15 DIAGNOSIS — N950 Postmenopausal bleeding: Secondary | ICD-10-CM | POA: Diagnosis not present

## 2011-12-15 DIAGNOSIS — N760 Acute vaginitis: Secondary | ICD-10-CM | POA: Diagnosis not present

## 2011-12-27 DIAGNOSIS — N952 Postmenopausal atrophic vaginitis: Secondary | ICD-10-CM | POA: Diagnosis not present

## 2012-01-25 DIAGNOSIS — Z23 Encounter for immunization: Secondary | ICD-10-CM | POA: Diagnosis not present

## 2012-04-18 DIAGNOSIS — H2589 Other age-related cataract: Secondary | ICD-10-CM | POA: Diagnosis not present

## 2012-05-16 DIAGNOSIS — H9319 Tinnitus, unspecified ear: Secondary | ICD-10-CM | POA: Diagnosis not present

## 2012-05-16 DIAGNOSIS — H903 Sensorineural hearing loss, bilateral: Secondary | ICD-10-CM | POA: Diagnosis not present

## 2012-05-16 DIAGNOSIS — J310 Chronic rhinitis: Secondary | ICD-10-CM | POA: Diagnosis not present

## 2012-05-16 DIAGNOSIS — H905 Unspecified sensorineural hearing loss: Secondary | ICD-10-CM | POA: Diagnosis not present

## 2012-06-22 DIAGNOSIS — Z01419 Encounter for gynecological examination (general) (routine) without abnormal findings: Secondary | ICD-10-CM | POA: Diagnosis not present

## 2012-06-22 DIAGNOSIS — Z0000 Encounter for general adult medical examination without abnormal findings: Secondary | ICD-10-CM | POA: Diagnosis not present

## 2012-06-22 DIAGNOSIS — Z124 Encounter for screening for malignant neoplasm of cervix: Secondary | ICD-10-CM | POA: Diagnosis not present

## 2012-06-30 ENCOUNTER — Other Ambulatory Visit: Payer: Self-pay | Admitting: Dermatology

## 2012-06-30 DIAGNOSIS — L570 Actinic keratosis: Secondary | ICD-10-CM | POA: Diagnosis not present

## 2012-06-30 DIAGNOSIS — L719 Rosacea, unspecified: Secondary | ICD-10-CM | POA: Diagnosis not present

## 2012-06-30 DIAGNOSIS — C44320 Squamous cell carcinoma of skin of unspecified parts of face: Secondary | ICD-10-CM | POA: Diagnosis not present

## 2012-06-30 DIAGNOSIS — D0439 Carcinoma in situ of skin of other parts of face: Secondary | ICD-10-CM | POA: Diagnosis not present

## 2012-07-06 ENCOUNTER — Encounter: Payer: Self-pay | Admitting: Internal Medicine

## 2012-07-06 DIAGNOSIS — S058X9A Other injuries of unspecified eye and orbit, initial encounter: Secondary | ICD-10-CM | POA: Diagnosis not present

## 2012-07-06 DIAGNOSIS — H1130 Conjunctival hemorrhage, unspecified eye: Secondary | ICD-10-CM | POA: Diagnosis not present

## 2012-07-06 DIAGNOSIS — C4490 Unspecified malignant neoplasm of skin, unspecified: Secondary | ICD-10-CM | POA: Insufficient documentation

## 2012-07-19 ENCOUNTER — Encounter: Payer: Self-pay | Admitting: Internal Medicine

## 2012-07-19 ENCOUNTER — Ambulatory Visit (INDEPENDENT_AMBULATORY_CARE_PROVIDER_SITE_OTHER): Payer: Medicare Other | Admitting: Internal Medicine

## 2012-07-19 VITALS — BP 142/82 | HR 92 | Temp 98.3°F | Wt 139.0 lb

## 2012-07-19 DIAGNOSIS — L500 Allergic urticaria: Secondary | ICD-10-CM | POA: Diagnosis not present

## 2012-07-19 DIAGNOSIS — T370X5A Adverse effect of sulfonamides, initial encounter: Secondary | ICD-10-CM

## 2012-07-19 DIAGNOSIS — T50995A Adverse effect of other drugs, medicaments and biological substances, initial encounter: Secondary | ICD-10-CM | POA: Diagnosis not present

## 2012-07-19 MED ORDER — METHYLPREDNISOLONE ACETATE 80 MG/ML IJ SUSP
120.0000 mg | Freq: Once | INTRAMUSCULAR | Status: AC
  Administered 2012-07-19: 120 mg via INTRAMUSCULAR

## 2012-07-19 MED ORDER — PREDNISONE 10 MG PO TABS: ORAL_TABLET | ORAL | Status: DC

## 2012-07-19 NOTE — Patient Instructions (Signed)
I think that you are having a reaction to the sulfa antibiotic that you took previously. Fortunately it seems to be confined to your skin at this time. We are giving you a cortisone injection and a prednisone pill taper to decrease the inflammation.  You can continue antihistamines I would suggest as Zyrtec or circumflex seen once a day and add on the Benadryl every 4-6 hours as needed for comfort it can make you groggy and have dry mouth.  With take care of not drive immediately after taking Benadryl.   If you are getting worse instead of better over the next 48-72 hours contact our office immediately.  Do not take sulfa or Bactrim or Septra in the future.    We'll send a copy of the note Dr. [place]  Drug Allergy Allergic reactions to medicines are common. Some allergic reactions are mild. A delayed type of drug allergy that occurs 1 week or more after exposure to a medicine or vaccine is called serum sickness. A life-threatening, sudden (acute) allergic reaction that involves the whole body is called anaphylaxis. CAUSES  "True" drug allergies occur when there is an allergic reaction to a medicine. This is caused by overactivity of the immune system. First, the body becomes sensitized. The immune system is triggered by your first exposure to the medicine. Following this first exposure, future exposure to the same medicine may be life-threatening. Almost any medicine can cause an allergic reaction. Common ones are:  Penicillin.  Sulfonamides (sulfa drugs).  Local anesthetics.  X-ray dyes that contain iodine. SYMPTOMS  Common symptoms of a minor allergic reaction are:  Swelling around the mouth.  An itchy red rash or hives.  Vomiting or diarrhea. Anaphylaxis can cause swelling of the mouth and throat. This makes it difficult to breathe and swallow. Severe reactions can be fatal within seconds, even after exposure to only a trace amount of the drug that causes the reaction. HOME CARE  INSTRUCTIONS   If you are unsure of what caused your reaction, keep a diary of foods and medicines used. Include the symptoms that followed. Avoid anything that causes reactions.  You may want to follow up with an allergy specialist after the reaction has cleared in order to be tested to confirm the allergy. It is important to confirm that your reaction is an allergy, not just a side effect to the medicine. If you have a true allergy to a medicine, this may prevent that medicine and related medicines from being given to you when you are very ill.  If you have hives or a rash:  Take medicines as directed by your caregiver.  You may use an over-the-counter antihistamine (diphenhydramine) as needed.  Apply cold compresses to the skin or take baths in cool water. Avoid hot baths or showers.  If you are severely allergic:  Continuous observation after a severe reaction may be needed. Hospitalization is often required.  Wear a medical alert bracelet or necklace stating your allergy.  You and your family must learn how to use an anaphylaxis kit or give an epinephrine injection to temporarily treat an emergency allergic reaction. If you have had a severe reaction, always carry your epinephrine injection or anaphylaxis kit with you. This can be lifesaving if you have a severe reaction.  Do not drive or perform tasks after treatment until the medicines used to treat your reaction have worn off, or until your caregiver says it is okay. SEEK MEDICAL CARE IF:   You think you had  an allergic reaction. Symptoms usually start within 30 minutes after exposure.  Symptoms are getting worse rather than better.  You develop new symptoms.  The symptoms that brought you to your caregiver return. SEEK IMMEDIATE MEDICAL CARE IF:   You have swelling of the mouth, difficulty breathing, or wheezing.  You have a tight feeling in your chest or throat.  You develop hives, swelling, or itching all over your  body.  You develop severe vomiting or diarrhea.  You feel faint or pass out. This is an emergency. Use your epinephrine injection or anaphylaxis kit as you have been instructed. Call for emergency medical help. Even if you improve after the injection, you need to be examined at a hospital emergency department. MAKE SURE YOU:   Understand these instructions.  Will watch your condition.  Will get help right away if you are not doing well or get worse. Document Released: 04/05/2005 Document Revised: 06/28/2011 Document Reviewed: 09/09/2010 [facility] Patient Information 2013 [place], [place].

## 2012-07-19 NOTE — Progress Notes (Signed)
Chief Complaint  Patient presents with  . Urticaria    Saturday and Sunday produced some itching.  Hives started yesterday and have gotten worse.  Has been seeing dermatology for similiar problem.  Started on Bactrim last month.  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  Started on medication.  Also using a Cipro eye drop.    HPI: Patient comes in today for SDA for  new problem evaluation. Took 4 doses benadryl .  miminal haelp. Itchy hives all over scalp is worse trunk none in mouth. Hx of  Rash on bactrim    March 22  And off  march 28th .  The lesion noted was removed by her dermatologist diagnosed ssca   Face  And  the next days became Swollen  After so treated  For secondary infection possibility with Septra back from Also other new things are new   imiqod cream  Mw f and new eye infection cipro. Drops  No fever vomiting syncope chest pain shortness of breath swelling dysphasia or cough.  Patient now remembers that she probably had a rash from Septra in the remote past but had forgotten when prescribed by physician ROS: See pertinent positives and negatives per HPI.  Past Medical History  Diagnosis Date  . Hiatal hernia   . Esophageal reflux   . Migraines   . Hyperlipemia   . Crohn's   . Eczema   . Arthritis   . Cataract   . Rosacea   . Skin cancer of face     scca removed     Family History  Problem Relation Age of Onset  . Parkinsonism Mother   . Lung cancer Mother   . Heart disease Father     died chf in 15 s  . Breast cancer Cousin     History   Social History  . Marital Status: Married    Spouse Name: N/A    Number of Children: 2  . Years of Education: N/A   Occupational History  . retired      Education officer, museum  .     Social History Main Topics  . Smoking status: Never Smoker   . Smokeless tobacco: Never Used  . Alcohol Use: 0.5 oz/week    1 drink(s) per week  . Drug Use: No  . Sexually Active: Not on file   Other Topics Concern  .  Not on file   Social History Narrative   hh  Of 2    No pets   Married    G2P2    Outpatient Encounter Prescriptions as of 07/19/2012  Medication Sig Dispense Refill  . Azelaic Acid (FINACEA) 15 % cream Apply topically 2 (two) times daily. After skin is thoroughly washed and patted dry, gently but thoroughly massage a thin film of azelaic acid cream into the affected area twice daily, in the morning and evening.      . cyclobenzaprine (FLEXERIL) 10 MG tablet Take 1 tablet (10 mg total) by mouth 2 (two) times daily as needed. Take by mouth as directed for muscle soreness  30 tablet  1  . diphenhydrAMINE (BENADRYL) 25 MG tablet Take 25 mg by mouth at bedtime as needed. For sleep      . estradiol (ESTRING) 2 MG vaginal ring Place 2 mg vaginally every 3 (three) months. follow package directions      . fluocinonide cream (LIDEX) 2.63 % Apply 1 application topically daily as needed. Use as directed when  needed for eczema      . fluticasone (FLONASE) 50 MCG/ACT nasal spray Place 2 sprays into the nose daily.  16 g  11  . ipratropium (ATROVENT) 0.06 % nasal spray 2 sprays each nostril 2-3 times per day  15 mL  11  . loratadine (CLARITIN) 10 MG tablet Take 10 mg by mouth daily as needed.        . mesalamine (PENTASA) 500 MG CR capsule Take three tablet by mouth twice a day  540 capsule  3  . MULTIPLE VITAMIN PO Take 1 tablet by mouth daily.       . Omega-3 Fatty Acids (FISH OIL PO) Take 1 capsule by mouth daily.        Marland Kitchen omeprazole (PRILOSEC) 40 MG capsule Take 1 capsule (40 mg total) by mouth daily.  90 capsule  3  . Probiotic Product ([place]) CAPS Take 1 capsule by mouth daily.        . simvastatin (ZOCOR) 40 MG tablet Take 1 tablet (40 mg total) by mouth at bedtime.  90 tablet  3  . SUMAtriptan (IMITREX) 100 MG tablet Take 100 mg by mouth as needed. For Migraine      . zolpidem (AMBIEN) 10 MG tablet Take 1 tablet (10 mg total) by mouth at bedtime as needed for sleep (take 1/2 to 1 ).  For sleep  90 tablet  1  . predniSONE (DELTASONE) 10 MG tablet Take pills per day,6,6,6,4,4,4,2,2,2,1,1,1  40 tablet  0  . [DISCONTINUED] conjugated estrogens (PREMARIN) vaginal cream Place 0.5 g vaginally as directed. Use on Thursdays and Mondays      . [EXPIRED] methylPREDNISolone acetate (DEPO-MEDROL) injection 120 mg        No facility-administered encounter medications on file as of 07/19/2012.    EXAM:  BP 142/82  Pulse 92  Temp(Src) 98.3 F (36.8 C) (Oral)  Wt 139 lb (63.05 kg)  BMI 22.77 kg/m2  SpO2 98%  Body mass index is 22.77 kg/(m^2).  GENERAL: vitals reviewed and listed above, alert, oriented, appears well hydrated and in no acute distress no angioedema noted some scratching of the arms  HEENT: atraumatic, conjunctiva  clear, no obvious abnormalities on inspection of external nose and ears OP : no lesion edema or exudate airway is good mucosal looks normal with no ulceration lips are normal  NECK: no obvious masses on inspection palpation no adenopathy supple  LUNGS: clear to auscultation bilaterally, no wheezes, rales or rhonchi, good air movement  CV: HRRR, no clubbing cyanosis or  peripheral edema nl cap refill  Skin healing biopsy site left cheek no surrounding swelling scattered hives coalescent and papular trunk and extremities back of head and scalp MS: moves all extremities without noticeable focal  abnormality  PSYCH: pleasant and cooperative, no obvious depression or anxiety  ASSESSMENT AND PLAN:  Discussed the following assessment and plan:  Allergic urticaria - Most likely culprit is the Bactrim discussed risk potential to get worse steroid systemic injection and oral antihistamine coverage - Plan: methylPREDNISolone acetate (DEPO-MEDROL) injection 120 mg  Adverse reaction to sulfamethoxazole, initial encounter Close observation for worsening signs or systemic symptoms. We'll send a copy of note her dermatologist. Expectant management risk-benefit of  medications. -Patient advised to return or notify health care team  if symptoms worsen or persist or new concerns arise.  Patient Instructions  I think that you are having a reaction to the sulfa antibiotic that you took previously. Fortunately it seems to be confined  to your skin at this time. We are giving you a cortisone injection and a prednisone pill taper to decrease the inflammation.  You can continue antihistamines I would suggest as Zyrtec or circumflex seen once a day and add on the Benadryl every 4-6 hours as needed for comfort it can make you groggy and have dry mouth.  With take care of not drive immediately after taking Benadryl.   If you are getting worse instead of better over the next 48-72 hours contact our office immediately.  Do not take sulfa or Bactrim or Septra in the future.    We'll send a copy of the note Dr. Martinique  Drug Allergy Allergic reactions to medicines are common. Some allergic reactions are mild. A delayed type of drug allergy that occurs 1 week or more after exposure to a medicine or vaccine is called serum sickness. A life-threatening, sudden (acute) allergic reaction that involves the whole body is called anaphylaxis. CAUSES  "True" drug allergies occur when there is an allergic reaction to a medicine. This is caused by overactivity of the immune system. First, the body becomes sensitized. The immune system is triggered by your first exposure to the medicine. Following this first exposure, future exposure to the same medicine may be life-threatening. Almost any medicine can cause an allergic reaction. Common ones are:  Penicillin.  Sulfonamides (sulfa drugs).  Local anesthetics.  X-ray dyes that contain iodine. SYMPTOMS  Common symptoms of a minor allergic reaction are:  Swelling around the mouth.  An itchy red rash or hives.  Vomiting or diarrhea. Anaphylaxis can cause swelling of the mouth and throat. This makes it difficult to breathe and  swallow. Severe reactions can be fatal within seconds, even after exposure to only a trace amount of the drug that causes the reaction. HOME CARE INSTRUCTIONS   If you are unsure of what caused your reaction, keep a diary of foods and medicines used. Include the symptoms that followed. Avoid anything that causes reactions.  You may want to follow up with an allergy specialist after the reaction has cleared in order to be tested to confirm the allergy. It is important to confirm that your reaction is an allergy, not just a side effect to the medicine. If you have a true allergy to a medicine, this may prevent that medicine and related medicines from being given to you when you are very ill.  If you have hives or a rash:  Take medicines as directed by your caregiver.  You may use an over-the-counter antihistamine (diphenhydramine) as needed.  Apply cold compresses to the skin or take baths in cool water. Avoid hot baths or showers.  If you are severely allergic:  Continuous observation after a severe reaction may be needed. Hospitalization is often required.  Wear a medical alert bracelet or necklace stating your allergy.  You and your family must learn how to use an anaphylaxis kit or give an epinephrine injection to temporarily treat an emergency allergic reaction. If you have had a severe reaction, always carry your epinephrine injection or anaphylaxis kit with you. This can be lifesaving if you have a severe reaction.  Do not drive or perform tasks after treatment until the medicines used to treat your reaction have worn off, or until your caregiver says it is okay. SEEK MEDICAL CARE IF:   You think you had an allergic reaction. Symptoms usually start within 30 minutes after exposure.  Symptoms are getting worse rather than better.  You develop  new symptoms.  The symptoms that brought you to your caregiver return. SEEK IMMEDIATE MEDICAL CARE IF:   You have swelling of the mouth,  difficulty breathing, or wheezing.  You have a tight feeling in your chest or throat.  You develop hives, swelling, or itching all over your body.  You develop severe vomiting or diarrhea.  You feel faint or pass out. This is an emergency. Use your epinephrine injection or anaphylaxis kit as you have been instructed. Call for emergency medical help. Even if you improve after the injection, you need to be examined at a hospital emergency department. MAKE SURE YOU:   Understand these instructions.  Will watch your condition.  Will get help right away if you are not doing well or get worse. Document Released: 04/05/2005 Document Revised: 06/28/2011 Document Reviewed: 09/09/2010 [facility] Patient Information 2013 [place].      Standley Brooking. Panosh M.D.

## 2012-08-18 ENCOUNTER — Other Ambulatory Visit (INDEPENDENT_AMBULATORY_CARE_PROVIDER_SITE_OTHER): Payer: Medicare Other

## 2012-08-18 ENCOUNTER — Encounter: Payer: Self-pay | Admitting: Gastroenterology

## 2012-08-18 ENCOUNTER — Ambulatory Visit (INDEPENDENT_AMBULATORY_CARE_PROVIDER_SITE_OTHER): Payer: Medicare Other | Admitting: Gastroenterology

## 2012-08-18 VITALS — BP 124/62 | HR 78 | Ht 65.35 in | Wt 139.0 lb

## 2012-08-18 DIAGNOSIS — K5090 Crohn's disease, unspecified, without complications: Secondary | ICD-10-CM

## 2012-08-18 DIAGNOSIS — R5383 Other fatigue: Secondary | ICD-10-CM

## 2012-08-18 DIAGNOSIS — R5381 Other malaise: Secondary | ICD-10-CM

## 2012-08-18 DIAGNOSIS — K5000 Crohn's disease of small intestine without complications: Secondary | ICD-10-CM | POA: Diagnosis not present

## 2012-08-18 LAB — FERRITIN: Ferritin: 69.1 ng/mL (ref 10.0–291.0)

## 2012-08-18 LAB — FOLATE: Folate: 21.2 ng/mL (ref >5.9)

## 2012-08-18 LAB — VITAMIN B12: Vitamin B-12: 695 pg/mL (ref 211–911)

## 2012-08-18 MED ORDER — MESALAMINE ER 500 MG PO CPCR: ORAL_CAPSULE | ORAL | Status: DC

## 2012-08-18 MED ORDER — OMEPRAZOLE 40 MG PO CPDR: 40.0000 mg | DELAYED_RELEASE_CAPSULE | Freq: Every day | ORAL | Status: DC

## 2012-08-18 NOTE — Progress Notes (Signed)
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  She is on Tazidime 1.5 g twice a day, and denies abdominal pain, diarrhea, or any other GI or systemic complaints.  The patient does take Prilosec 40 mg a day for GERD and also probiotic therapy.  Her appetite is good her weight is stable.  She denies extra gastrointestinal manifestations of inflammatory bowel disease.  She has mild chronic fatigue, but otherwise is asymptomatic except for some recent skin cancers.  I cannot see a previous B12 level in her labs on review.  Current Medications, Allergies, Past Medical History, Past Surgical History, Family History and Social History were reviewed in [employer] record.  ROS: All systems were reviewed and are negative unless otherwise stated in the HPI.          Physical Exam: Healthy-appearing patient in no distress.  Blood pressure 124/62, pulse 78 and regular and weight 139 with BMI of 22.88.  Cannot appreciate stigmata of chronic liver disease.  Abdominal exam shows no organomegaly, masses, tenderness, or distention.  Mental status is normal.    Assessment and Plan: : This patient has had many years of localized Crohn's ileitis without evidence of colonic or more proximal bowel involvement.  She's done extremely well on Pentasa, and she is concerned about the cost of this medication, but all other amino salicylates currently are delayed release products, and I do not feel that would be effective in her case.  We certainly do not want to get on chronic steroids or immunosuppressants or biological agents.  We'll continue her current dose of Pentasa, and I will send her by the lab today to check anemia profile and B12 levels.  She is up-to-date on her colonoscopy and other labs.  She sees me yearly or when necessary as needed.  Please copy Dr. Edwinna Areola and Dr. Shanon Ace.  I will defer to primary care per vaccination and need for calcium, vitamin D, and yearly or periodic lab parameters otherwise. Encounter Diagnosis  Name Primary?  . Crohn's disease Yes

## 2012-08-18 NOTE — Addendum Note (Signed)
Addended by: Ok Anis A on: 08/18/2012 12:17 PM   Modules accepted: Orders

## 2012-08-18 NOTE — Patient Instructions (Signed)
  Your physician has requested that you go to the basement for the following lab work before leaving today: Anemia Panel  New prescription for Omeprazole and Pentasa was sent to your pharmacy.  Please follow up with Dr. Jarold Motto in one year. __________________________________________________________________________________________________________________                                               We are excited to introduce MyChart, a new best-in-class service that provides you online access to important information in your electronic medical record. We want to make it easier for you to view your health information - all in one secure location - when and where you need it. We expect MyChart will enhance the quality of care and service we provide.  When you register for MyChart, you can:    View your test results.    Request appointments and receive appointment reminders via email.    Request medication renewals.    View your medical history, allergies, medications and immunizations.    Communicate with your physician's office through a password-protected site.    Conveniently print information such as your medication lists.  To find out if MyChart is right for you, please talk to a member of our clinical staff today. We will gladly answer your questions about this free health and wellness tool.  If you are age 68 or older and want a member of your family to have access to your record, you must provide written consent by completing a proxy form available at our office. Please speak to our clinical staff about guidelines regarding accounts for patients younger than age 39.  As you activate your MyChart account and need any technical assistance, please call the MyChart technical support line at [phone]) or email your question to [email or web address]. If you email your question(s), please include your name, a return phone number and the best time to reach  you.  If you have non-urgent health-related questions, you can send a message to our office through MyChart at [email or web address]. If you have a medical emergency, call 911.  Thank you for using MyChart as your new health and wellness resource!   MyChart licensed from [employer],  [id number]. Patents Pending.

## 2012-08-21 LAB — IBC PANEL: Transferrin: 300.1 mg/dL (ref 212.0–360.0)

## 2012-08-24 ENCOUNTER — Ambulatory Visit (INDEPENDENT_AMBULATORY_CARE_PROVIDER_SITE_OTHER): Payer: Medicare Other | Admitting: Obstetrics & Gynecology

## 2012-08-24 ENCOUNTER — Encounter: Payer: Self-pay | Admitting: Obstetrics & Gynecology

## 2012-08-24 VITALS — BP 122/62 | HR 64 | Resp 12

## 2012-08-24 DIAGNOSIS — N952 Postmenopausal atrophic vaginitis: Secondary | ICD-10-CM

## 2012-08-24 DIAGNOSIS — N898 Other specified noninflammatory disorders of vagina: Secondary | ICD-10-CM | POA: Diagnosis not present

## 2012-08-24 DIAGNOSIS — N760 Acute vaginitis: Secondary | ICD-10-CM | POA: Diagnosis not present

## 2012-08-24 MED ORDER — ESTRADIOL 2 MG VA RING: 2.0000 mg | VAGINAL_RING | VAGINAL | Status: DC

## 2012-08-24 NOTE — Patient Instructions (Signed)
Vaginal Biopsy Post-procedure Instructions   You may take Ibuprofen, Aleve, or Tylenol for any pain or discomfort.   You may have a small amount of spotting.  You should wear a mini pad for the next few days.   You will need to call the office if you have any unusual drainage, if the bleeding is heavy, or if you have any concerns.     Shower or bathe as normal   You will be notified within one week of your biopsy results or we will discuss your results at your follow-up appointment if needed.

## 2012-08-24 NOTE — Progress Notes (Signed)
Patient ID: Bridget Reyes, female   DOB: [birth year], 67 y.o.   MRN: [id number]  67 y.o. Married Caucasian female G2P2 here for follow up of vaginal atrophy.  Patient was seen for AEX 06/22/12 and described significant issues with vaginal dryness.  She used Estring in the past but at one point the Estring actually adhered to the vaginal wall and had to be removed in the office.  Since restarting the Estring about two months ago, she feels so much better from a moisture standpoint.  At her exam, there was a small yellowish, hypervascular area on the posterior vaginal wall.  She is here for recheck of this area.    O: Healthy WD,WN female Affect: normal Pelvic exam:EXTERNAL GENITALIA: normal appearing vulva with no masses, tenderness or lesions VAGINA: normal mucosa except for the same yellowish, hypervascular lesion noted mid position on the posterior vaginal wall, no discharge or blood, Estring is in place today CERVIX: absent UTERUS: absent  Procedure:  Area cleansed with Betadine.  Using biopsy forceps, representative biopsy of lesion obtained.  Monsel's solution applied.  Patient tolerated procedure well.  Minimal bleeding noted.   A:Vaginal atrophy, vaginal lesion  P: Biopsy result pending.  For now, continue Estring.  Rx for year given.

## 2012-08-29 DIAGNOSIS — G43019 Migraine without aura, intractable, without status migrainosus: Secondary | ICD-10-CM | POA: Diagnosis not present

## 2012-08-30 ENCOUNTER — Telehealth: Payer: Self-pay | Admitting: *Deleted

## 2012-08-30 NOTE — Telephone Encounter (Signed)
Message copied by Alisa Graff on Wed Aug 30, 2012  5:59 PM ------      Message from: Jerene Bears      Created: Fri Aug 25, 2012  2:06 PM       Please inform pt biopsy just showed an area of inflammation.  She can continue with using the Estring.  I will see her at her AEX or before if she has problems. ------

## 2012-08-30 NOTE — Telephone Encounter (Signed)
Patient notifed of Dr Ammie Ferrier instruction, see result note.  Call PRN

## 2012-09-19 ENCOUNTER — Other Ambulatory Visit: Payer: Self-pay

## 2012-09-19 DIAGNOSIS — Z1231 Encounter for screening mammogram for malignant neoplasm of breast: Secondary | ICD-10-CM

## 2012-09-24 ENCOUNTER — Other Ambulatory Visit: Payer: Self-pay | Admitting: Gastroenterology

## 2012-10-11 DIAGNOSIS — Z85828 Personal history of other malignant neoplasm of skin: Secondary | ICD-10-CM | POA: Diagnosis not present

## 2012-10-11 DIAGNOSIS — B009 Herpesviral infection, unspecified: Secondary | ICD-10-CM | POA: Diagnosis not present

## 2012-10-23 ENCOUNTER — Ambulatory Visit
Admission: RE | Admit: 2012-10-23 | Discharge: 2012-10-23 | Disposition: A | Discharge status: Home / Self Care | Payer: Medicare Other | Source: Ambulatory Visit

## 2012-10-23 DIAGNOSIS — Z1231 Encounter for screening mammogram for malignant neoplasm of breast: Secondary | ICD-10-CM | POA: Diagnosis not present

## 2012-11-07 ENCOUNTER — Encounter: Payer: Self-pay | Admitting: Internal Medicine

## 2012-11-07 ENCOUNTER — Ambulatory Visit (INDEPENDENT_AMBULATORY_CARE_PROVIDER_SITE_OTHER): Payer: Medicare Other | Admitting: Internal Medicine

## 2012-11-07 VITALS — BP 134/72 | HR 71 | Temp 97.7°F | Ht 65.75 in | Wt 135.0 lb

## 2012-11-07 DIAGNOSIS — Z9889 Other specified postprocedural states: Secondary | ICD-10-CM

## 2012-11-07 DIAGNOSIS — E785 Hyperlipidemia, unspecified: Secondary | ICD-10-CM | POA: Diagnosis not present

## 2012-11-07 DIAGNOSIS — K5080 Crohn's disease of both small and large intestine without complications: Secondary | ICD-10-CM

## 2012-11-07 DIAGNOSIS — G479 Sleep disorder, unspecified: Secondary | ICD-10-CM

## 2012-11-07 DIAGNOSIS — Z0000 Encounter for general adult medical examination without abnormal findings: Secondary | ICD-10-CM | POA: Diagnosis not present

## 2012-11-07 DIAGNOSIS — J309 Allergic rhinitis, unspecified: Secondary | ICD-10-CM

## 2012-11-07 DIAGNOSIS — R252 Cramp and spasm: Secondary | ICD-10-CM

## 2012-11-07 DIAGNOSIS — Z974 Presence of external hearing-aid: Secondary | ICD-10-CM

## 2012-11-07 LAB — CBC WITH DIFFERENTIAL/PLATELET
Basophils Absolute: 0.1 10*3/uL (ref 0.0–0.1)
Eosinophils Absolute: 0 10*3/uL (ref 0.0–0.7)
HCT: 42.4 % (ref 36.0–46.0)
Lymphs Abs: 1.4 10*3/uL (ref 0.7–4.0)
MCHC: 34.6 g/dL (ref 30.0–36.0)
MCV: 94.7 fl (ref 78.0–100.0)
Monocytes Absolute: 0.6 10*3/uL (ref 0.1–1.0)
Monocytes Relative: 9.7 % (ref 3.0–12.0)
Neutro Abs: 3.9 10*3/uL (ref 1.4–7.7)
Platelets: 236 10*3/uL (ref 150.0–400.0)
RDW: 12.4 % (ref 11.5–14.6)

## 2012-11-07 LAB — LIPID PANEL
Cholesterol: 166 mg/dL (ref 0–200)
HDL: 48 mg/dL (ref >39.00)
LDL Cholesterol: 92 mg/dL (ref 0–99)
Triglycerides: 130 mg/dL (ref 0.0–149.0)
VLDL: 26 mg/dL (ref 0.0–40.0)

## 2012-11-07 LAB — T4, FREE: Free T4: 0.87 ng/dL (ref 0.60–1.60)

## 2012-11-07 LAB — HEPATIC FUNCTION PANEL
Albumin: 4.5 g/dL (ref 3.5–5.2)
Total Bilirubin: 0.5 mg/dL (ref 0.3–1.2)

## 2012-11-07 LAB — BASIC METABOLIC PANEL
BUN: 13 mg/dL (ref 6–23)
CO2: 30 mEq/L (ref 19–32)
GFR: 77.01 mL/min (ref >60.00)
Glucose, Bld: 90 mg/dL (ref 70–99)
Potassium: 4.1 mEq/L (ref 3.5–5.1)

## 2012-11-07 LAB — TSH: TSH: 2.2 u[IU]/mL (ref 0.35–5.50)

## 2012-11-07 MED ORDER — ZOLPIDEM TARTRATE 10 MG PO TABS: 10.0000 mg | ORAL_TABLET | Freq: Every evening | ORAL | Status: DC | PRN

## 2012-11-07 MED ORDER — FLUTICASONE PROPIONATE 50 MCG/ACT NA SUSP: 2.0000 | Freq: Every day | NASAL | Status: DC

## 2012-11-07 MED ORDER — IPRATROPIUM BROMIDE 0.06 % NA SOLN: NASAL | Status: DC

## 2012-11-07 MED ORDER — CYCLOBENZAPRINE HCL 10 MG PO TABS: 5.0000 mg | ORAL_TABLET | Freq: Two times a day (BID) | ORAL | Status: DC | PRN

## 2012-11-07 MED ORDER — ZOLPIDEM TARTRATE 10 MG PO TABS: ORAL_TABLET | ORAL | Status: DC

## 2012-11-07 MED ORDER — SIMVASTATIN 40 MG PO TABS: 40.0000 mg | ORAL_TABLET | Freq: Every day | ORAL | Status: DC

## 2012-11-07 MED ORDER — FLUOCINONIDE 0.05 % EX CREA: 1.0000 "application " | TOPICAL_CREAM | Freq: Every day | CUTANEOUS | Status: DC | PRN

## 2012-11-07 NOTE — Patient Instructions (Signed)
Caution with sleep adis  Is dependent producing  .   5 mg is the advised dose.  avoit taking more than 2-3 day per week.  Do not take with alcohol or other sedating   meds  dont drive  For at least 10 hours past taking.  Take 5 mg instead of flexeril  10 mg to avoid sedation.

## 2012-11-07 NOTE — Progress Notes (Signed)
Chief Complaint  Patient presents with  . Medicare Wellness    HPI: Patient comes in today for Preventive Medicare wellness visit . No major injuries, ed visits ,hospitalizations , new medications since last visit.  Crohn's stable gerd stable   Sleep  Not every night Ambien only when really need it.  About  2 or less per week.  Charley horses and feet cramps.   Flexeril not often 10 mg makes her drowsy   Lipid med no se of med noted   Hearing:  Hearing aid in one ear.  Not that helpful right   Vision:  No limitations at present . Last eye check UTD glasses   Safety:  Has smoke detector and wears seat belts.  No firearms. No excess sun exposure. Sees dentist regularly.  Falls: NONE  Advance directive :  Reviewed    Memory: Felt to be good  , no concern from her or her family.  Depression: No anhedonia unusual crying or depressive symptoms  Nutrition: Eats well balanced diet; adequate calcium and vitamin D. No swallowing chewing problems.  Injury: no major injuries in the last six months.  Other healthcare providers:  Reviewed today .  Social:  Lives with spouse married. No pets.   Preventive parameters: up-to-date  Reviewed   ADLS:   There are no problems or need for assistance  driving, feeding, obtaining food, dressing, toileting and bathing, managing money using phone. She is independent.  EXERCISE/ HABITS  Per week   No tobacco    etoh  ocassional not daily, exercise curves .   Coffee drinker.    ROS:  GEN/ HEENT: No fever, significant weight changes sweats headaches vision problems hearing changes, CV/ PULM; No chest pain shortness of breath cough, syncope,edema  change in exercise tolerance. GI /GU: No adominal pain, vomiting, change in bowel habits. No blood in the stool. No significant GU symptoms. SKIN/HEME: ,no acute skin rashes suspicious lesions or bleeding. No lymphadenopathy, nodules, masses.  NEURO/ PSYCH:  No neurologic signs such as weakness  numbness. No depression anxiety. IMM/ Allergy: No unusual infections.  Allergy .   REST of 12 system review negative except as per HPI   Past Medical History  Diagnosis Date  . Hiatal hernia   . Esophageal reflux   . Migraines   . Hyperlipemia   . Crohn's   . Eczema   . Arthritis   . Cataract   . Rosacea   . Skin cancer of face     scca removed     Family History  Problem Relation Age of Onset  . Parkinsonism Mother   . Lung cancer Mother   . Heart disease Father     died chf in 33 s  . Hypertension Father   . Breast cancer Cousin   . Congestive Heart Failure Father    Past Surgical History  Procedure Laterality Date  . Total vaginal hysterectomy  1994  . Tonsillectomy    . Breast biopsy  1980    left    History   Social History  . Marital Status: Married    Spouse Name: N/A    Number of Children: 2  . Years of Education: N/A   Occupational History  . retired      Engineer, site  .     Social History Main Topics  . Smoking status: Never Smoker   . Smokeless tobacco: Never Used  . Alcohol Use: 0.5 oz/week    1 drink(s) per week  .  Drug Use: No  . Sexually Active: Yes    Birth Control/ Protection: Surgical     Comment: hysterectomy   Other Topics Concern  . None   Social History Narrative   hh  Of 2   hh of 5  Husband and daughter adn 2 children 58 and 10  For now.    No pets   Married    G2P2    Outpatient Encounter Prescriptions as of 11/07/2012  Medication Sig Dispense Refill  . Azelaic Acid (FINACEA) 15 % cream Apply topically 2 (two) times daily. After skin is thoroughly washed and patted dry, gently but thoroughly massage a thin film of azelaic acid cream into the affected area twice daily, in the morning and evening.      . cetirizine (ZYRTEC) 10 MG tablet Take 10 mg by mouth daily as needed for allergies.      . cyclobenzaprine (FLEXERIL) 10 MG tablet Take 0.5 tablets (5 mg total) by mouth 2 (two) times daily as needed.  30 tablet  2  .  diphenhydrAMINE (BENADRYL) 25 MG tablet Take 25 mg by mouth at bedtime as needed. For sleep      . estradiol (ESTRING) 2 MG vaginal ring Place 2 mg vaginally every 3 (three) months. follow package directions  1 each  4  . fluocinonide cream (LIDEX) 0.05 % Apply 1 application topically daily as needed. Use as directed when needed for eczema  30 g  1  . fluticasone (FLONASE) 50 MCG/ACT nasal spray Place 2 sprays into the nose daily.  16 g  11  . ipratropium (ATROVENT) 0.06 % nasal spray 2 sprays each nostril 2-3 times per day  15 mL  11  . mesalamine (PENTASA) 500 MG CR capsule Take three tablet by mouth twice a day  540 capsule  3  . MULTIPLE VITAMIN PO Take 1 tablet by mouth daily.       . Omega-3 Fatty Acids (FISH OIL PO) Take 1 capsule by mouth daily.        Marland Kitchen omeprazole (PRILOSEC) 40 MG capsule Take 1 capsule (40 mg total) by mouth daily.  90 capsule  3  . Probiotic Product (PHILLIPS COLON HEALTH) CAPS Take 1 capsule by mouth daily.        . simvastatin (ZOCOR) 40 MG tablet Take 1 tablet (40 mg total) by mouth at bedtime.  90 tablet  3  . SUMAtriptan (IMITREX) 100 MG tablet Take 100 mg by mouth as needed. For Migraine      . [DISCONTINUED] cyclobenzaprine (FLEXERIL) 10 MG tablet Take 1 tablet (10 mg total) by mouth 2 (two) times daily as needed. Take by mouth as directed for muscle soreness  30 tablet  1  . [DISCONTINUED] fluocinonide cream (LIDEX) 0.05 % Apply 1 application topically daily as needed. Use as directed when needed for eczema      . [DISCONTINUED] fluticasone (FLONASE) 50 MCG/ACT nasal spray Place 2 sprays into the nose daily.  16 g  11  . [DISCONTINUED] ipratropium (ATROVENT) 0.06 % nasal spray 2 sprays each nostril 2-3 times per day  15 mL  11  . [DISCONTINUED] simvastatin (ZOCOR) 40 MG tablet Take 1 tablet (40 mg total) by mouth at bedtime.  90 tablet  3  . zolpidem (AMBIEN) 10 MG tablet Take 1/2 to 1 hs prn sleep. Avoid taking more than 3 x per week.  30 tablet  3  .  [DISCONTINUED] imiquimod (ALDARA) 5 % cream Apply topically 3 (three)  times a week.       . [DISCONTINUED] zolpidem (AMBIEN) 10 MG tablet Take 1 tablet (10 mg total) by mouth at bedtime as needed for sleep (take 1/2 to 1 ). For sleep  90 tablet  1  . [DISCONTINUED] zolpidem (AMBIEN) 10 MG tablet Take 1 tablet (10 mg total) by mouth at bedtime as needed for sleep (take 1/2 to 1 ). For sleep  90 tablet  1   No facility-administered encounter medications on file as of 11/07/2012.    EXAM:  BP 134/72  Pulse 71  Temp(Src) 97.7 F (36.5 C) (Oral)  Ht 5' 5.75" (1.67 m)  Wt 135 lb (61.236 kg)  BMI 21.96 kg/m2  SpO2 98%  Body mass index is 21.96 kg/(m^2).  Physical Exam: Vital signs reviewed EXB:MWUX is a well-developed well-nourished alert cooperative   who appears stated age in no acute distress.  HEENT: normocephalic atraumatic , Eyes: PERRL EOM's full, conjunctiva clear, Nares: paten,t no deformity discharge or tenderness., Ears: no deformity EAC's clear TMs with normal landmarks. Mouth: clear OP, no lesions, edema.  Moist mucous membranes. Dentition in adequate repair. NECK: supple without masses, thyromegaly or bruits. CHEST/PULM:  Clear to auscultation and percussion breath sounds equal no wheeze , rales or rhonchi. No chest wall deformities or tenderness. Breast: normal by inspection . No dimpling, discharge, masses, tenderness or discharge . CV: PMI is nondisplaced, S1 S2 no gallops, murmurs, rubs. Peripheral pulses are full without delay.No JVD .  ABDOMEN: Bowel sounds normal nontender  No guard or rebound, no hepato splenomegal no CVA tenderness.  No hernia. Extremtities:  No clubbing cyanosis or edema, no acute joint swelling or redness no focal atrophy NEURO:  Oriented x3, cranial nerves 3-12 appear to be intact, no obvious focal weakness,gait within normal limits no abnormal reflexes or asymmetrical SKIN: No acute rashes normal turgor, color, no bruising or petechiae. PSYCH:  Oriented, good eye contact, no obvious depression anxiety, cognition and judgment appear normal. LN: no cervical axillary inguinal adenopathy No noted deficits in memory, attention, and speech.   Lab Results  Component Value Date   WBC 5.9 11/07/2012   HGB 14.6 11/07/2012   HCT 42.4 11/07/2012   PLT 236.0 11/07/2012   GLUCOSE 90 11/07/2012   CHOL 166 11/07/2012   TRIG 130.0 11/07/2012   HDL 48.00 11/07/2012   LDLCALC 92 11/07/2012   ALT 23 11/07/2012   AST 21 11/07/2012   NA 139 11/07/2012   K 4.1 11/07/2012   CL 102 11/07/2012   CREATININE 0.8 11/07/2012   BUN 13 11/07/2012   CO2 30 11/07/2012   TSH 2.20 11/07/2012    ASSESSMENT AND PLAN:  Discussed the following assessment and plan:  Medicare annual wellness visit, subsequent - Plan: Basic metabolic panel, CBC with Differential, Hepatic function panel, Lipid panel, TSH, T4, free, Magnesium  Foot cramps - Plan: Basic metabolic panel, CBC with Differential, Hepatic function panel, Lipid panel, TSH, T4, free, Magnesium  Leg cramps - Plan: Basic metabolic panel, CBC with Differential, Hepatic function panel, Lipid panel, TSH, T4, free, Magnesium  Crohn's disease of both small and large intestine without complication - Plan: Basic metabolic panel, CBC with Differential, Hepatic function panel, Lipid panel, TSH, T4, free, Magnesium  SLEEP DISORDER/DISTURBANCE - Plan: Basic metabolic panel, CBC with Differential, Hepatic function panel, Lipid panel, TSH, T4, free, Magnesium  HYPERLIPIDEMIA - Plan: Basic metabolic panel, CBC with Differential, Hepatic function panel, Lipid panel, TSH, T4, free, Magnesium  ALLERGIC RHINITIS - Plan: Basic  metabolic panel, CBC with Differential, Hepatic function panel, Lipid panel, TSH, T4, free, Magnesium  Hearing aid worn - one side right  Risk benefit of medication discussed.dosages decrease  Use sparingly  Counseled regarding healthy nutrition, exercise, sleep, injury prevention, calcium vit d and healthy  weight . utd on hcm per patient Patient Care Team: Madelin Headings, MD as PCP - General Mardella Layman, MD (Gastroenterology) Delfin Gant, MD (Family Medicine) Amy Y [place], MD (Dermatology) Annamaria Boots, MD (Gynecology) Santiago Glad, MD as Referring Physician (Specialist) Lamarr Lulas, MD (Ophthalmology)  Patient Instructions  Caution with sleep adis  Is dependent producing  .   5 mg is the advised dose.  avoit taking more than 2-3 day per week.  Do not take with alcohol or other sedating   meds  dont drive  For at least 10 hours past taking.  Take 5 mg instead of flexeril  10 mg to avoid sedation.     Neta Mends. Alyra Patty M.D.  Health Maintenance  Topic Date Due  . Influenza Vaccine  12/18/2012  . Tetanus/tdap  10/17/2013  . Mammogram  10/24/2014  . Colonoscopy  08/23/2020  . Pneumococcal Polysaccharide Vaccine Age 39 And Over  Completed  . Zostavax  Completed   Health Maintenance Review

## 2012-11-09 ENCOUNTER — Telehealth: Payer: Self-pay | Admitting: Obstetrics & Gynecology

## 2012-11-09 DIAGNOSIS — Z974 Presence of external hearing-aid: Secondary | ICD-10-CM | POA: Insufficient documentation

## 2012-11-09 NOTE — Telephone Encounter (Signed)
Spoke with patient she is concerned after removing her Estring 2 days ago,  she saw red blood on the Estring and  a light brown d/c. Should she continue using the Estring, give it a break and try the Osphena you had mention Before. Please advise.

## 2012-11-09 NOTE — Telephone Encounter (Signed)
Patient is back to using Esting. She is still having problems. Removed Estring 2 days ago and saw blood again. Please call and advise.

## 2012-11-10 ENCOUNTER — Encounter: Payer: Self-pay | Admitting: Family Medicine

## 2012-11-10 MED ORDER — OSPEMIFENE 60 MG PO TABS: 1.0000 | ORAL_TABLET | Freq: Every day | ORAL | Status: DC

## 2012-11-10 NOTE — Telephone Encounter (Signed)
I think she should try the Osphena.  I know she didn't want to use a product that is "new" but I think it is time.  I sent Rx to pharmacy.  Maybe she should follow-up with me after using the Osphena for three or four weeks.

## 2012-11-10 NOTE — Telephone Encounter (Signed)
Patient notified of Dr. Hyacinth Meeker response of medication Osphena and Rx sent to her pharmacy. Appointment given for Dr. Hyacinth Meeker Sept 3rd @ 1:45pm to follow up at patient request.

## 2012-11-25 ENCOUNTER — Other Ambulatory Visit: Payer: Self-pay | Admitting: Internal Medicine

## 2012-12-20 ENCOUNTER — Ambulatory Visit: Payer: Medicare Other | Admitting: Obstetrics & Gynecology

## 2012-12-25 ENCOUNTER — Encounter: Payer: Self-pay | Admitting: Obstetrics & Gynecology

## 2012-12-25 ENCOUNTER — Ambulatory Visit (INDEPENDENT_AMBULATORY_CARE_PROVIDER_SITE_OTHER): Payer: Medicare Other | Admitting: Obstetrics & Gynecology

## 2012-12-25 VITALS — BP 136/62 | HR 64 | Resp 16 | Ht 65.25 in | Wt 141.6 lb

## 2012-12-25 DIAGNOSIS — N952 Postmenopausal atrophic vaginitis: Secondary | ICD-10-CM | POA: Diagnosis not present

## 2012-12-25 MED ORDER — OSPEMIFENE 60 MG PO TABS: 1.0000 | ORAL_TABLET | Freq: Every day | ORAL | Status: DC

## 2012-12-25 NOTE — Progress Notes (Signed)
67 y.o. Married Caucasian female G2P2 here for follow up of vaginal atrophic changes after starting Osphena.  Doing well with this.  Some slight discharge.  No odor.  No vaignal bleeding.  Intercourse is "tolerable" but better than with nothing.  Cost is really going to be too much for pt.  ~$160/month.  I do not have any samples to give her.  Savings card provided.  O: Healthy WD,WN female Affect: normal Pelvic exam:EXTERNAL GENITALIA: normal appearing vulva with no masses, tenderness or lesions VAGINA: no abnormal discharge or lesions CERVIX: no lesions or cervical motion tenderness and absent UTERUS: absent ADNEXA: no pelvic masses  A:Atrophic vaginal changes, improved with Osphena.  P: Rx to pharmacy for one year.  Savings card given.

## 2013-01-02 DIAGNOSIS — Z23 Encounter for immunization: Secondary | ICD-10-CM | POA: Diagnosis not present

## 2013-01-05 ENCOUNTER — Telehealth: Payer: Self-pay | Admitting: Emergency Medicine

## 2013-01-05 NOTE — Telephone Encounter (Signed)
Called for Prior Authorization:  [facility] sent fax  Rejected under Medicare Part D. Not covered.   Spoke with patient. She feels that BCBS is her drug benefit.  [facility] with [facility], [employer] is her drug benefit under part D. The monthly cost with savings card is 160/mo.  Advised patient, she is aware and will call if she needs further savings card.

## 2013-01-10 ENCOUNTER — Encounter: Payer: Self-pay | Admitting: Family Medicine

## 2013-01-10 ENCOUNTER — Ambulatory Visit (INDEPENDENT_AMBULATORY_CARE_PROVIDER_SITE_OTHER): Payer: Medicare Other | Admitting: Family Medicine

## 2013-01-10 VITALS — BP 124/60 | HR 80 | Temp 98.6°F | Wt 140.0 lb

## 2013-01-10 DIAGNOSIS — IMO0002 Reserved for concepts with insufficient information to code with codable children: Secondary | ICD-10-CM

## 2013-01-10 DIAGNOSIS — L03011 Cellulitis of right finger: Secondary | ICD-10-CM

## 2013-01-10 MED ORDER — DOXYCYCLINE HYCLATE 100 MG PO TABS: 100.0000 mg | ORAL_TABLET | Freq: Two times a day (BID) | ORAL | Status: DC

## 2013-01-10 NOTE — Progress Notes (Signed)
  Subjective:    Patient ID: Bridget Reyes, female    DOB: Oct 04, 1945, 67 y.o.   MRN: [id number]  HPI Here for 3 days of swelling and pain around the right thumbnail. No fever. Using Neosporin.    Review of Systems  Constitutional: Negative.   Skin: Positive for color change.       Objective:   Physical Exam  Constitutional: She appears well-developed and well-nourished.  Skin:  The right thumb has an area of erythema , swelling, and tenderness at the nail edge.           Assessment & Plan:  The skin was lunctured with a sterile needle and purulent drainage was expressed. Cover with Doxycyline. Use hot soaks prn

## 2013-01-17 ENCOUNTER — Other Ambulatory Visit: Payer: Self-pay | Admitting: Internal Medicine

## 2013-02-16 ENCOUNTER — Encounter: Payer: Self-pay | Admitting: Obstetrics & Gynecology

## 2013-02-17 DIAGNOSIS — N390 Urinary tract infection, site not specified: Secondary | ICD-10-CM | POA: Diagnosis not present

## 2013-02-19 MED ORDER — ESTRADIOL 10 MCG VA TABS: 1.0000 | ORAL_TABLET | VAGINAL | Status: DC

## 2013-02-19 NOTE — Telephone Encounter (Signed)
[facility] (via MyChart message).

## 2013-03-29 ENCOUNTER — Other Ambulatory Visit: Payer: Self-pay | Admitting: Internal Medicine

## 2013-04-09 NOTE — Telephone Encounter (Signed)
Ok to refill x 1  

## 2013-05-03 DIAGNOSIS — H524 Presbyopia: Secondary | ICD-10-CM | POA: Diagnosis not present

## 2013-05-03 DIAGNOSIS — H5200 Hypermetropia, unspecified eye: Secondary | ICD-10-CM | POA: Diagnosis not present

## 2013-05-03 DIAGNOSIS — H04129 Dry eye syndrome of unspecified lacrimal gland: Secondary | ICD-10-CM | POA: Diagnosis not present

## 2013-05-03 DIAGNOSIS — H52229 Regular astigmatism, unspecified eye: Secondary | ICD-10-CM | POA: Diagnosis not present

## 2013-05-22 ENCOUNTER — Ambulatory Visit (INDEPENDENT_AMBULATORY_CARE_PROVIDER_SITE_OTHER): Payer: Medicare Other | Admitting: Gastroenterology

## 2013-05-22 ENCOUNTER — Encounter: Payer: Self-pay | Admitting: Gastroenterology

## 2013-05-22 VITALS — BP 120/64 | HR 86 | Ht 65.25 in | Wt 139.0 lb

## 2013-05-22 DIAGNOSIS — K5090 Crohn's disease, unspecified, without complications: Secondary | ICD-10-CM

## 2013-05-22 DIAGNOSIS — K5000 Crohn's disease of small intestine without complications: Secondary | ICD-10-CM

## 2013-05-22 MED ORDER — OMEPRAZOLE 40 MG PO CPDR: 40.0000 mg | DELAYED_RELEASE_CAPSULE | Freq: Every day | ORAL | Status: DC

## 2013-05-22 MED ORDER — MESALAMINE ER 500 MG PO CPCR: ORAL_CAPSULE | ORAL | Status: DC

## 2013-05-22 NOTE — Patient Instructions (Signed)
Please follow up with Dr. Hilarie Fredrickson in one year

## 2013-05-22 NOTE — Progress Notes (Signed)
This is a very nice 68 year old Caucasian female with chronic Crohn's ileitis.  She currently is in remission on Pentasa 3 tablets 3 times a day.  She denies lower abdominal pain, diarrhea, any upper GI or hepatobiliary complaints.  Colonoscopy was last done in may of 2012 and shows mild ileitis.  She's not had a severe flare of her disease in many years.  B12 level last year was normal greater than 600.  She is followed by Dr. Regis Bill and primary care, and lab review shows no abnormalities.  Her general sense of well-being is good and she denies extra colonic manifestations of IBD.  Current Medications, Allergies, Past Medical History, Past Surgical History, Family History and Social History were reviewed in [employer] record.  ROS: All systems were reviewed and are negative unless otherwise stated in the HPI.          Physical Exam: Healthy patient in no distress.  Blood pressure 120/64, pulse 86 and regular and weight 139 with a BMI of 22.96.  I cannot appreciate stigmata of chronic liver disease.  Her abdomen shows no organomegaly, masses, tenderness.  Bowel sounds are normal.  Rectal exam was deferred.  Mental status is normal    Assessment and Plan: Chronic ileitis in remission with standard doses of Pentasa 1 g 3 times a day.  She's not had a flare in several years but colonoscopy one year ago showed mildly active ileitis.  I am reluctant to stop her medication we'll continue all drugs as listed and reviewed.  She is to see Dr. Hilarie Fredrickson in one years time for followup upon my return.  She is to call if she has a relapse of any of her IBD symptoms.  Cc: Dr. Shanon Ace

## 2013-05-24 ENCOUNTER — Other Ambulatory Visit: Payer: Self-pay | Admitting: Internal Medicine

## 2013-06-25 ENCOUNTER — Ambulatory Visit: Payer: BLUE CROSS/BLUE SHIELD | Admitting: Obstetrics & Gynecology

## 2013-06-26 ENCOUNTER — Ambulatory Visit: Payer: BLUE CROSS/BLUE SHIELD | Admitting: Obstetrics & Gynecology

## 2013-07-02 DIAGNOSIS — H903 Sensorineural hearing loss, bilateral: Secondary | ICD-10-CM | POA: Diagnosis not present

## 2013-07-02 DIAGNOSIS — H9319 Tinnitus, unspecified ear: Secondary | ICD-10-CM | POA: Diagnosis not present

## 2013-07-03 ENCOUNTER — Encounter: Payer: Self-pay | Admitting: Obstetrics & Gynecology

## 2013-07-03 ENCOUNTER — Ambulatory Visit (INDEPENDENT_AMBULATORY_CARE_PROVIDER_SITE_OTHER): Payer: Medicare Other | Admitting: Obstetrics & Gynecology

## 2013-07-03 VITALS — BP 142/78 | HR 68 | Resp 16 | Ht 65.5 in | Wt 140.2 lb

## 2013-07-03 DIAGNOSIS — Z124 Encounter for screening for malignant neoplasm of cervix: Secondary | ICD-10-CM | POA: Diagnosis not present

## 2013-07-03 DIAGNOSIS — Z0000 Encounter for general adult medical examination without abnormal findings: Secondary | ICD-10-CM | POA: Diagnosis not present

## 2013-07-03 DIAGNOSIS — Z01419 Encounter for gynecological examination (general) (routine) without abnormal findings: Secondary | ICD-10-CM

## 2013-07-03 LAB — POCT URINALYSIS DIPSTICK
BILIRUBIN UA: NEGATIVE
Glucose, UA: NEGATIVE
KETONES UA: NEGATIVE
Leukocytes, UA: NEGATIVE
NITRITE UA: NEGATIVE
PH UA: 5
Protein, UA: NEGATIVE
RBC UA: NEGATIVE
Urobilinogen, UA: NEGATIVE

## 2013-07-03 MED ORDER — ESTRADIOL 10 MCG VA TABS: 1.0000 | ORAL_TABLET | VAGINAL | Status: DC

## 2013-07-03 NOTE — Progress Notes (Signed)
68 y.o. G2P2 MarriedCaucasianF here for annual exam.  H/O vaginal ulceration when using estring.  Had a vaginal tissue biopsy at this looked abnormal and didn't resolve quickly.  Used Osphena and had hair loss.  Back on vagifem and not having issues with this now.    Patient's last menstrual period was 04/19/1992.          Sexually active: yes  The current method of family planning is status post hysterectomy.    Exercising: yes  curves and walking Smoker:  no  Health Maintenance: Pap:  05/31/11 WNL History of abnormal Pap:  no MMG:  10/23/12 3D-normal Colonoscopy:  2012-repeat in 5 years BMD:   10/05/10-normal TDaP:  10/2003 Screening Labs: PCP, Hb today: PCP, Urine today: negative   reports that she has never smoked. She has never used smokeless tobacco. She reports that she drinks about 0.5 ounces of alcohol per week. She reports that she does not use illicit drugs.  Past Medical History  Diagnosis Date  . Hiatal hernia   . Esophageal reflux   . Migraines   . Hyperlipemia   . Crohn's   . Eczema   . Arthritis   . Cataract   . Rosacea   . Skin cancer of face     scca removed     Past Surgical History  Procedure Laterality Date  . Total vaginal hysterectomy  1994  . Tonsillectomy    . Breast biopsy  1980    left  . Colonoscopy  2012    Current Outpatient Prescriptions  Medication Sig Dispense Refill  . Azelaic Acid (FINACEA) 15 % cream Apply topically 2 (two) times daily. After skin is thoroughly washed and patted dry, gently but thoroughly massage a thin film of azelaic acid cream into the affected area twice daily, in the morning and evening.      . cetirizine (ZYRTEC) 10 MG tablet Take 10 mg by mouth daily as needed for allergies.      . cyclobenzaprine (FLEXERIL) 10 MG tablet TAKE 1/2 TABLET TWICE A DAY AS NEEDED  30 tablet  0  . Estradiol (VAGIFEM) 10 MCG TABS vaginal tablet Place vaginally as directed.      . fluocinonide cream (LIDEX) 3.15 % Apply 1 application  topically daily as needed. Use as directed when needed for eczema  30 g  1  . fluticasone (FLONASE) 50 MCG/ACT nasal spray Place 2 sprays into the nose daily.  16 g  11  . ipratropium (ATROVENT) 0.06 % nasal spray USE 2 SPRAYS IN EACH NOSTRIL 2-3 TIMES A DAY  15 mL  5  . Lactobacillus Rhamnosus, GG, (CULTURELLE PO) Take by mouth daily.      . mesalamine (PENTASA) 500 MG CR capsule Take three tablet by mouth twice a day  540 capsule  3  . MULTIPLE VITAMIN PO Take 1 tablet by mouth daily.       . Omega-3 Fatty Acids (FISH OIL PO) Take 1 capsule by mouth daily.        Marland Kitchen omeprazole (PRILOSEC) 40 MG capsule Take 1 capsule (40 mg total) by mouth daily.  90 capsule  3  . simvastatin (ZOCOR) 40 MG tablet Take 1 tablet (40 mg total) by mouth at bedtime.  90 tablet  3  . SUMAtriptan (IMITREX) 100 MG tablet Take 100 mg by mouth as needed. For Migraine      . zolpidem (AMBIEN) 10 MG tablet 10 mg at bedtime as needed.      Marland Kitchen  diphenhydrAMINE (BENADRYL) 25 MG tablet Take 25 mg by mouth at bedtime as needed. For sleep       No current facility-administered medications for this visit.    Family History  Problem Relation Age of Onset  . Parkinsonism Mother   . Lung cancer Mother   . Heart disease Father     died chf in 4 s  . Hypertension Father   . Breast cancer Cousin   . Congestive Heart Failure Father     ROS:  Pertinent items are noted in HPI.  Otherwise, a comprehensive ROS was negative.  Exam:   BP 142/78  Pulse 68  Resp 16  Ht 5' 5.5" (1.664 m)  Wt 140 lb 3.2 oz (63.594 kg)  BMI 22.97 kg/m2  LMP 04/19/1992  Weight change: stable  Height: 5' 5.5" (166.4 cm)  Ht Readings from Last 3 Encounters:  07/03/13 5' 5.5" (1.664 m)  05/22/13 5' 5.25" (1.657 m)  12/25/12 5' 5.25" (1.657 m)    General appearance: alert, cooperative and appears stated age Head: Normocephalic, without obvious abnormality, atraumatic Neck: no adenopathy, supple, symmetrical, trachea midline and thyroid normal to  inspection and palpation Lungs: clear to auscultation bilaterally Breasts: normal appearance, no masses or tenderness Heart: regular rate and rhythm Abdomen: soft, non-tender; bowel sounds normal; no masses,  no organomegaly Extremities: extremities normal, atraumatic, no cyanosis or edema Skin: Skin color, texture, turgor normal. No rashes or lesions Lymph nodes: Cervical, supraclavicular, and axillary nodes normal. No abnormal inguinal nodes palpated Neurologic: Grossly normal   Pelvic: External genitalia:  no lesions              Urethra:  normal appearing urethra with no masses, tenderness or lesions              Bartholins and Skenes: normal                 Vagina: normal appearing vagina with normal color and discharge except for feathery lesion about the size of a quarter that bleeds when touched by a q-tip.              Cervix: absent              Pap taken: no Bimanual Exam:  Uterus:  uterus absent              Adnexa: normal adnexa and no mass, fullness, tenderness               Rectovaginal: Confirms               Anus:  normal sphincter tone, no lesions  A:  Well Woman with normal exam PMP H/O TVH H/O vaginal ulceration/granulation tissue after Estring was "stuck" on vagina.  Biopsy of this tissue has been negative. Vaginal atrophic changes H/O Crohn's ileitis--followed by Dr. Sharlett Iles  P:   Mammogram yearly. Grade b breast density pap smear obtained due to feather tissue (which I have biopsied).  If abnormal, will repeat biopsy or excise.  Consider treatment with possible laser due to change in size. Continue vagifem 27meq pv twice weekly.  Rx to pharmacy. return annually or prn  An After Visit Summary was printed and given to the patient.

## 2013-07-09 DIAGNOSIS — Z124 Encounter for screening for malignant neoplasm of cervix: Secondary | ICD-10-CM | POA: Diagnosis not present

## 2013-07-11 ENCOUNTER — Other Ambulatory Visit: Payer: Self-pay | Admitting: Internal Medicine

## 2013-07-11 LAB — IPS PAP SMEAR ONLY

## 2013-07-13 NOTE — Telephone Encounter (Signed)
[facility] called to check on refill for Ipratropium Nasal Spray

## 2013-07-19 ENCOUNTER — Encounter: Payer: Self-pay | Admitting: Obstetrics & Gynecology

## 2013-08-14 ENCOUNTER — Encounter: Payer: Self-pay | Admitting: Nurse Practitioner

## 2013-08-14 ENCOUNTER — Ambulatory Visit (INDEPENDENT_AMBULATORY_CARE_PROVIDER_SITE_OTHER): Payer: Medicare Other | Admitting: Nurse Practitioner

## 2013-08-14 VITALS — BP 150/68 | HR 68 | Temp 97.9°F | Ht 65.5 in | Wt 142.6 lb

## 2013-08-14 DIAGNOSIS — R300 Dysuria: Secondary | ICD-10-CM | POA: Diagnosis not present

## 2013-08-14 LAB — POCT URINALYSIS DIPSTICK
Bilirubin, UA: NEGATIVE
Glucose, UA: NEGATIVE
KETONES UA: NEGATIVE
Nitrite, UA: NEGATIVE
PROTEIN UA: NEGATIVE
UROBILINOGEN UA: NEGATIVE
pH, UA: 8

## 2013-08-14 MED ORDER — NITROFURANTOIN MONOHYD MACRO 100 MG PO CAPS: 100.0000 mg | ORAL_CAPSULE | Freq: Two times a day (BID) | ORAL | Status: DC

## 2013-08-14 NOTE — Progress Notes (Signed)
S:   68 y.o.Married Caucasian female presents with complaint of UTI. Symptoms began on  4/27.   With symptoms of dysuria, urinary frequency, urinary urgency. Pertinent negatives include:  No fever of chills, no back pain. The patient is having no constitutional symptoms, denying fever, chills, anorexia, or weight loss.. Sexually active yes  Symptoms are not related to post coital.   Menopausal and using Vagifem for  vaginal dryness.  Same partner without change. Last UTI documented in November.  She thinks last two episodes are related to travel and a decrease in po fluids.  ROS: no weight loss, fever, night sweats and feels well  O:  alert, oriented to person, place, and time, normal mood, behavior, speech, dress, motor activity, and thought processes   thin, healthy,  alert and  not in acute distress  Very mild over suprapubic area, no flank pain  No CVA tenderness  deferred   Diagnostic Test:    Urinalysis wbc- 5-10   urine culture  Assessment:  R/O UTI    Postmenopausal    history of atrophic vaginitis and using Vagifem  Plan:   Medications: nitrofurantoin. Maintain adequate hydration. Follow up if symptoms not improving, and as needed.   Medication Therapy: Macrobid 100 mg BID for a week   Lab:TOC if Urine Culture is positive

## 2013-08-14 NOTE — Progress Notes (Deleted)
Subjective:     Patient ID: Bridget Reyes, female   DOB: 04/20/1945, 68 y.o.   MRN: [id number]  HPI  This 68 yo Fe complains of UTI sympotms since last pm. Symptoms include urgency, frequency, and dysuria.  denies fever and chills.  No back pain.   Last UTI  in November and did well on Mactrobid.  The last twotomes maybe related to travel.  Not SA for 1-2 weeks.   Review of Systems     Objective:   Physical Exam     Assessment:     ***    Plan:     ***

## 2013-08-14 NOTE — Patient Instructions (Signed)
Urinary Tract Infection  Urinary tract infections (UTIs) can develop anywhere along your urinary tract. Your urinary tract is your body's drainage system for removing wastes and extra water. Your urinary tract includes two kidneys, two ureters, a bladder, and a urethra. Your kidneys are a pair of bean-shaped organs. Each kidney is about the size of your fist. They are located below your ribs, one on each side of your spine.  CAUSES  Infections are caused by microbes, which are microscopic organisms, including fungi, viruses, and bacteria. These organisms are so small that they can only be seen through a microscope. Bacteria are the microbes that most commonly cause UTIs.  SYMPTOMS   Symptoms of UTIs may vary by age and gender of the patient and by the location of the infection. Symptoms in young women typically include a frequent and intense urge to urinate and a painful, burning feeling in the bladder or urethra during urination. Older women and men are more likely to be tired, shaky, and weak and have muscle aches and abdominal pain. A fever may mean the infection is in your kidneys. Other symptoms of a kidney infection include pain in your back or sides below the ribs, nausea, and vomiting.  DIAGNOSIS  To diagnose a UTI, your caregiver will ask you about your symptoms. Your caregiver also will ask to provide a urine sample. The urine sample will be tested for bacteria and white blood cells. White blood cells are made by your body to help fight infection.  TREATMENT   Typically, UTIs can be treated with medication. Because most UTIs are caused by a bacterial infection, they usually can be treated with the use of antibiotics. The choice of antibiotic and length of treatment depend on your symptoms and the type of bacteria causing your infection.  HOME CARE INSTRUCTIONS   If you were prescribed antibiotics, take them exactly as your caregiver instructs you. Finish the medication even if you feel better after you  have only taken some of the medication.   Drink enough water and fluids to keep your urine clear or pale yellow.   Avoid caffeine, tea, and carbonated beverages. They tend to irritate your bladder.   Empty your bladder often. Avoid holding urine for long periods of time.   Empty your bladder before and after sexual intercourse.   After a bowel movement, women should cleanse from front to back. Use each tissue only once.  SEEK MEDICAL CARE IF:    You have back pain.   You develop a fever.   Your symptoms do not begin to resolve within 3 days.  SEEK IMMEDIATE MEDICAL CARE IF:    You have severe back pain or lower abdominal pain.   You develop chills.   You have nausea or vomiting.   You have continued burning or discomfort with urination.  MAKE SURE YOU:    Understand these instructions.   Will watch your condition.   Will get help right away if you are not doing well or get worse.  Document Released: 01/13/2005 Document Revised: 10/05/2011 Document Reviewed: 05/14/2011  Exit[employer] Patient Information 2014 [employer].

## 2013-08-15 LAB — URINALYSIS, MICROSCOPIC ONLY
BACTERIA UA: NONE SEEN
Casts: NONE SEEN
Crystals: NONE SEEN
SQUAMOUS EPITHELIAL / LPF: NONE SEEN

## 2013-08-16 LAB — URINE CULTURE: Colony Count: >=100000

## 2013-08-16 NOTE — Progress Notes (Signed)
Encounter reviewed by Dr. Ciarah Peace Silva.  

## 2013-08-21 DIAGNOSIS — G43019 Migraine without aura, intractable, without status migrainosus: Secondary | ICD-10-CM | POA: Diagnosis not present

## 2013-08-29 ENCOUNTER — Ambulatory Visit (INDEPENDENT_AMBULATORY_CARE_PROVIDER_SITE_OTHER): Payer: Medicare Other | Admitting: *Deleted

## 2013-08-29 VITALS — BP 120/54 | HR 80 | Resp 18 | Ht 65.5 in | Wt 140.0 lb

## 2013-08-29 DIAGNOSIS — N390 Urinary tract infection, site not specified: Secondary | ICD-10-CM

## 2013-08-29 NOTE — Progress Notes (Signed)
Patient in today for TOC. Patient states she finished her abx about 1 - 2 weeks ago. She denies any Sx or concerns.

## 2013-09-02 LAB — URINE CULTURE: Colony Count: 10000

## 2013-09-03 ENCOUNTER — Encounter: Payer: Self-pay | Admitting: Certified Nurse Midwife

## 2013-09-03 ENCOUNTER — Telehealth: Payer: Self-pay | Admitting: Obstetrics & Gynecology

## 2013-09-03 ENCOUNTER — Ambulatory Visit (INDEPENDENT_AMBULATORY_CARE_PROVIDER_SITE_OTHER): Payer: Medicare Other | Admitting: Certified Nurse Midwife

## 2013-09-03 VITALS — BP 158/78 | HR 72 | Temp 97.8°F | Resp 16 | Wt 144.0 lb

## 2013-09-03 DIAGNOSIS — N390 Urinary tract infection, site not specified: Secondary | ICD-10-CM

## 2013-09-03 LAB — POCT URINALYSIS DIPSTICK
Bilirubin, UA: NEGATIVE
Glucose, UA: NEGATIVE
Ketones, UA: NEGATIVE
Nitrite, UA: NEGATIVE
Urobilinogen, UA: NEGATIVE
pH, UA: 5

## 2013-09-03 MED ORDER — PHENAZOPYRIDINE HCL 200 MG PO TABS: 200.0000 mg | ORAL_TABLET | Freq: Three times a day (TID) | ORAL | Status: DC | PRN

## 2013-09-03 MED ORDER — CIPROFLOXACIN HCL 250 MG PO TABS: 250.0000 mg | ORAL_TABLET | Freq: Two times a day (BID) | ORAL | Status: DC

## 2013-09-03 NOTE — Telephone Encounter (Signed)
Spoke with patient. Patient states that she woke up this morning at 3am and had blood in her urine and burning with urination. Denies back pain, fevers, and chills. Appointment scheduled today at 10:30am with Regina Eck CNM. Patient agreeable to date and time.  Routing to provider for final review. Patient agreeable to disposition. Will close encounter  CC: Regina Eck CNM

## 2013-09-03 NOTE — Telephone Encounter (Signed)
Pt says she has a uti. Please schedule an appointment.

## 2013-09-03 NOTE — Progress Notes (Signed)
Reviewed personally.  M. Suzanne Kyia Rhude, MD.  

## 2013-09-03 NOTE — Patient Instructions (Signed)
Urinary Tract Infection  Urinary tract infections (UTIs) can develop anywhere along your urinary tract. Your urinary tract is your body's drainage system for removing wastes and extra water. Your urinary tract includes two kidneys, two ureters, a bladder, and a urethra. Your kidneys are a pair of bean-shaped organs. Each kidney is about the size of your fist. They are located below your ribs, one on each side of your spine.  CAUSES  Infections are caused by microbes, which are microscopic organisms, including fungi, viruses, and bacteria. These organisms are so small that they can only be seen through a microscope. Bacteria are the microbes that most commonly cause UTIs.  SYMPTOMS   Symptoms of UTIs may vary by age and gender of the patient and by the location of the infection. Symptoms in young women typically include a frequent and intense urge to urinate and a painful, burning feeling in the bladder or urethra during urination. Older women and men are more likely to be tired, shaky, and weak and have muscle aches and abdominal pain. A fever may mean the infection is in your kidneys. Other symptoms of a kidney infection include pain in your back or sides below the ribs, nausea, and vomiting.  DIAGNOSIS  To diagnose a UTI, your caregiver will ask you about your symptoms. Your caregiver also will ask to provide a urine sample. The urine sample will be tested for bacteria and white blood cells. White blood cells are made by your body to help fight infection.  TREATMENT   Typically, UTIs can be treated with medication. Because most UTIs are caused by a bacterial infection, they usually can be treated with the use of antibiotics. The choice of antibiotic and length of treatment depend on your symptoms and the type of bacteria causing your infection.  HOME CARE INSTRUCTIONS   If you were prescribed antibiotics, take them exactly as your caregiver instructs you. Finish the medication even if you feel better after you  have only taken some of the medication.   Drink enough water and fluids to keep your urine clear or pale yellow.   Avoid caffeine, tea, and carbonated beverages. They tend to irritate your bladder.   Empty your bladder often. Avoid holding urine for long periods of time.   Empty your bladder before and after sexual intercourse.   After a bowel movement, women should cleanse from front to back. Use each tissue only once.  SEEK MEDICAL CARE IF:    You have back pain.   You develop a fever.   Your symptoms do not begin to resolve within 3 days.  SEEK IMMEDIATE MEDICAL CARE IF:    You have severe back pain or lower abdominal pain.   You develop chills.   You have nausea or vomiting.   You have continued burning or discomfort with urination.  MAKE SURE YOU:    Understand these instructions.   Will watch your condition.   Will get help right away if you are not doing well or get worse.  Document Released: 01/13/2005 Document Revised: 10/05/2011 Document Reviewed: 05/14/2011  Exit[employer] Patient Information 2014 [employer].

## 2013-09-03 NOTE — Progress Notes (Signed)
68 y.o. married white g2p2 here with complaint of UTI, with onset  on 09/03/13. Patient complaining of urinary frequency/urgency/ and pain with urination in the past 12 hours. Patient denies fever, chills, nausea or back pain. No new personal products. Patient feels not related to sexual activity. Denies any vaginal symptoms.  Menopausal with vaginal dryness with Vagifem use, sporadic.  Patient recently treated for E. Coli UTI with Macrobid completed approximately 2 1/2 weeks ago. Recent urine culture shows enterococcus 10,000 colonies on 09/02/13. "It feels like the same all over again".  O: Healthy female WDWN Affect: Normal, orientation x 3 Skin : warm and dry CVAT: negative bilateral Abdomen: positive for suprapubic tenderness  Pelvic exam: External genital area: normal, no lesions Bladder,Urethra, Urethral meatus: tender Vagina:scant vaginal discharge, atrophic appearance   Cervix: absent Uterus:absent Adnexa: normal non tender, no fullness or masses   A: UTI recurrent Atrophic Vaginitis, inconsistent use of Vagifem  P: Reviewed findings of UTI and need for treatment. E. Coli not present in previous culture, but maybe present again. SU:[id number] see order with instructions Rx Pyridium see order [id number] micro, culture Reviewed warning signs and symptoms of UTI and need to call if occurs. Encouraged to limit soda, tea, and coffee RV 2 week if TOC needed for positive culture  Start on Vagifem consistently again twice weekly. Discussed vaginal dryness can cause increase risk of UTI. Patient agreeable.  RV prn

## 2013-09-04 ENCOUNTER — Telehealth: Payer: Self-pay | Admitting: *Deleted

## 2013-09-04 LAB — URINALYSIS, MICROSCOPIC ONLY
Bacteria, UA: NONE SEEN
Casts: NONE SEEN
Crystals: NONE SEEN
Squamous Epithelial / HPF: NONE SEEN

## 2013-09-04 NOTE — Telephone Encounter (Signed)
Patient needs call back. That was Chong Sicilian note regarding culture last week. We have not received the culture results from yesterdays visit. Her micro yesterday was positive with WBC.

## 2013-09-04 NOTE — Telephone Encounter (Signed)
Spoke with patient. Advised that message was from 5/13 (last urine culture) but that she does currently have an infection and she should continue with Cipro as rx from [facility]. Patient states she is feeling improved. Denies fevers, flank pain, nausea, vomiting. She will continue with Cipro per order from 09/03/13. Patient states she is going out of town on Thursday and requests that if she needs to change rx please call her cell phone, as she will need an out of town pharmacy.

## 2013-09-04 NOTE — Telephone Encounter (Signed)
Patient  is waiting on a return call from a nurse. Patient is asking that you call on her cell number.

## 2013-09-04 NOTE — Telephone Encounter (Signed)
Called patient to notified about Urine Culture results.  Patient states she is feeling better but not over Sx yet.  Patient would like to talk to Ms Jackelyn Poling, she feels there is something else bothering her bc the culture was negative and she having some sx.

## 2013-09-05 LAB — URINE CULTURE: Colony Count: 25000

## 2013-09-05 NOTE — Addendum Note (Signed)
Addended by: Regina Eck on: 09/05/2013 12:06 PM   Modules accepted: Orders

## 2013-09-11 ENCOUNTER — Telehealth: Payer: Self-pay

## 2013-09-11 NOTE — Telephone Encounter (Signed)
lmtcb

## 2013-09-11 NOTE — Telephone Encounter (Signed)
Patient notified of results as written by provider 

## 2013-09-11 NOTE — Telephone Encounter (Signed)
Returning a call to [place].

## 2013-09-11 NOTE — Telephone Encounter (Signed)
Left message to call back  

## 2013-09-11 NOTE — Telephone Encounter (Signed)
Message copied by Susy [place] on Tue Sep 11, 2013 11:09 AM ------      Message from: Regina Eck      Created: Wed Sep 05, 2013 12:07 PM       Notify patient culture is positive for E. Coli and she is on appropriate medication. Complete medication      TOC 2 weeks for culture. Order in ------

## 2013-09-18 ENCOUNTER — Other Ambulatory Visit: Payer: Self-pay

## 2013-09-18 DIAGNOSIS — Z1231 Encounter for screening mammogram for malignant neoplasm of breast: Secondary | ICD-10-CM

## 2013-09-19 ENCOUNTER — Ambulatory Visit (INDEPENDENT_AMBULATORY_CARE_PROVIDER_SITE_OTHER): Payer: Medicare Other | Admitting: *Deleted

## 2013-09-19 VITALS — BP 130/60 | HR 68 | Resp 16 | Ht 65.5 in | Wt 140.0 lb

## 2013-09-19 DIAGNOSIS — N390 Urinary tract infection, site not specified: Secondary | ICD-10-CM | POA: Diagnosis not present

## 2013-09-19 NOTE — Progress Notes (Signed)
Patient in today to recheck urine culture. Patient denies any sx or concerns. She states she is feeling well. -Advised pt we will call her with UC results early next week. - Patient agreed.

## 2013-09-21 LAB — URINE CULTURE
Colony Count: NO GROWTH
ORGANISM ID, BACTERIA: NO GROWTH

## 2013-10-15 DIAGNOSIS — L919 Hypertrophic disorder of the skin, unspecified: Secondary | ICD-10-CM | POA: Diagnosis not present

## 2013-10-15 DIAGNOSIS — L821 Other seborrheic keratosis: Secondary | ICD-10-CM | POA: Diagnosis not present

## 2013-10-15 DIAGNOSIS — D239 Other benign neoplasm of skin, unspecified: Secondary | ICD-10-CM | POA: Diagnosis not present

## 2013-10-15 DIAGNOSIS — D1801 Hemangioma of skin and subcutaneous tissue: Secondary | ICD-10-CM | POA: Diagnosis not present

## 2013-10-15 DIAGNOSIS — L909 Atrophic disorder of skin, unspecified: Secondary | ICD-10-CM | POA: Diagnosis not present

## 2013-10-24 ENCOUNTER — Ambulatory Visit
Admission: RE | Admit: 2013-10-24 | Discharge: 2013-10-24 | Disposition: A | Discharge status: Home / Self Care | Payer: Medicare Other | Source: Ambulatory Visit

## 2013-10-24 DIAGNOSIS — Z1231 Encounter for screening mammogram for malignant neoplasm of breast: Secondary | ICD-10-CM | POA: Diagnosis not present

## 2013-11-13 ENCOUNTER — Ambulatory Visit (INDEPENDENT_AMBULATORY_CARE_PROVIDER_SITE_OTHER): Payer: Medicare Other | Admitting: Internal Medicine

## 2013-11-13 ENCOUNTER — Encounter: Payer: Self-pay | Admitting: Internal Medicine

## 2013-11-13 VITALS — BP 128/64 | Temp 98.7°F | Ht 65.25 in | Wt 140.0 lb

## 2013-11-13 DIAGNOSIS — Z23 Encounter for immunization: Secondary | ICD-10-CM

## 2013-11-13 DIAGNOSIS — E785 Hyperlipidemia, unspecified: Secondary | ICD-10-CM

## 2013-11-13 DIAGNOSIS — Z9889 Other specified postprocedural states: Secondary | ICD-10-CM

## 2013-11-13 DIAGNOSIS — Z974 Presence of external hearing-aid: Secondary | ICD-10-CM

## 2013-11-13 DIAGNOSIS — Z0000 Encounter for general adult medical examination without abnormal findings: Secondary | ICD-10-CM

## 2013-11-13 DIAGNOSIS — G479 Sleep disorder, unspecified: Secondary | ICD-10-CM

## 2013-11-13 DIAGNOSIS — K5080 Crohn's disease of both small and large intestine without complications: Secondary | ICD-10-CM

## 2013-11-13 DIAGNOSIS — J309 Allergic rhinitis, unspecified: Secondary | ICD-10-CM

## 2013-11-13 LAB — CBC WITH DIFFERENTIAL/PLATELET
BASOS PCT: 0.4 % (ref 0.0–3.0)
Basophils Absolute: 0 10*3/uL (ref 0.0–0.1)
EOS ABS: 0 10*3/uL (ref 0.0–0.7)
Eosinophils Relative: 0.1 % (ref 0.0–5.0)
HCT: 41.4 % (ref 36.0–46.0)
Hemoglobin: 14 g/dL (ref 12.0–15.0)
LYMPHS PCT: 22.7 % (ref 12.0–46.0)
Lymphs Abs: 1.2 10*3/uL (ref 0.7–4.0)
MCHC: 33.7 g/dL (ref 30.0–36.0)
MCV: 93.9 fl (ref 78.0–100.0)
MONO ABS: 0.4 10*3/uL (ref 0.1–1.0)
Monocytes Relative: 7.3 % (ref 3.0–12.0)
Neutro Abs: 3.6 10*3/uL (ref 1.4–7.7)
Neutrophils Relative %: 69.5 % (ref 43.0–77.0)
PLATELETS: 218 10*3/uL (ref 150.0–400.0)
RBC: 4.41 Mil/uL (ref 3.87–5.11)
RDW: 12.6 % (ref 11.5–15.5)
WBC: 5.1 10*3/uL (ref 4.0–10.5)

## 2013-11-13 LAB — TSH: TSH: 2.6 u[IU]/mL (ref 0.35–4.50)

## 2013-11-13 LAB — LIPID PANEL
CHOL/HDL RATIO: 3
Cholesterol: 175 mg/dL (ref 0–200)
HDL: 53.4 mg/dL (ref >39.00)
LDL Cholesterol: 99 mg/dL (ref 0–99)
NonHDL: 121.6
TRIGLYCERIDES: 111 mg/dL (ref 0.0–149.0)
VLDL: 22.2 mg/dL (ref 0.0–40.0)

## 2013-11-13 LAB — BASIC METABOLIC PANEL
BUN: 14 mg/dL (ref 6–23)
CALCIUM: 9.5 mg/dL (ref 8.4–10.5)
CHLORIDE: 102 meq/L (ref 96–112)
CO2: 31 mEq/L (ref 19–32)
CREATININE: 0.8 mg/dL (ref 0.4–1.2)
GFR: 74.6 mL/min (ref >60.00)
GLUCOSE: 99 mg/dL (ref 70–99)
Potassium: 3.9 mEq/L (ref 3.5–5.1)
Sodium: 138 mEq/L (ref 135–145)

## 2013-11-13 LAB — HEPATIC FUNCTION PANEL
ALT: 28 U/L (ref 0–35)
AST: 29 U/L (ref 0–37)
Albumin: 4.3 g/dL (ref 3.5–5.2)
Alkaline Phosphatase: 71 U/L (ref 39–117)
BILIRUBIN TOTAL: 0.8 mg/dL (ref 0.2–1.2)
Bilirubin, Direct: 0.1 mg/dL (ref 0.0–0.3)
TOTAL PROTEIN: 7.2 g/dL (ref 6.0–8.3)

## 2013-11-13 MED ORDER — SIMVASTATIN 40 MG PO TABS: 40.0000 mg | ORAL_TABLET | Freq: Every day | ORAL | Status: DC

## 2013-11-13 MED ORDER — CYCLOBENZAPRINE HCL 10 MG PO TABS: ORAL_TABLET | ORAL | Status: DC

## 2013-11-13 MED ORDER — IPRATROPIUM BROMIDE 0.06 % NA SOLN: NASAL | Status: DC

## 2013-11-13 NOTE — Patient Instructions (Signed)
Continue lifestyle intervention healthy eating and exercise . Healthy lifestyle includes : At least 150 minutes of exercise weeks  , weight at healthy levels, which is usually   BMI 19-25. Avoid trans fats and processed foods;  Increase fresh fruits and veges to 5 servings per day. And avoid sweet beverages including tea and juice. Mediterranean diet with olive oil and nuts have been noted to be heart and brain healthy . Avoid tobacco products . Limit  alcohol to  7 per week for women and 14 servings for men.  Get adequate sleep .  Will notify you  of labs when available. colonoscopy as per GI mammogram yearly as discussed  Check yearly

## 2013-11-13 NOTE — Progress Notes (Signed)
Pre visit review using our clinic review tool, if applicable. No additional management support is needed unless otherwise documented below in the visit note.  Chief Complaint  Patient presents with  . Medicare Wellness    HPI: Patient comes in today for Preventive Medicare wellness visit . No major injuries, ed visits ,hospitalizations , new medications since last visit.  crohns in remission  UTIS in few months  And on meds  .  Better now  macrobid to cipro   Lipids  Every day no se of meds   Needs refill Atrovent allergic phenom  Sleep still problematic 3 am bathroom  Seem to be habit since children were young currently uses many strategies dph uses some   ocass neck pain and stiffness  As ks for refill of flexeril  Sees dr F  fro migraines stable    Mammo has hetrogeneous breasts   Health Maintenance  Topic Date Due  . Influenza Vaccine  11/17/2013  . Mammogram  10/25/2015  . Colonoscopy  08/23/2020  . Tetanus/tdap  11/14/2023  . Pneumococcal Polysaccharide Vaccine Age 52 And Over  Completed  . Zostavax  Completed   Health Maintenance Review  LIFESTYLE:  Exercise:  Curves and walks  Tobacco/ETS:no Alcohol: 2 per week or less Sugar beverages: diet coke  Sleep: 5-6 hours   Mind  Awakenings ;coffee in am  Drug use: no Bone density:  2012  Colonoscopy:  utd q 2-3 years?    Hearing:   Hearing  aid in one ear.   Vision:  No limitations at present . Last eye check UTD glasses   Safety:  Has smoke detector and wears seat belts.  No firearms. No excess sun exposure. Sees dentist regularly.  Falls: no  Advance directive :  Reviewed   Has a plan   Memory: Felt to be good  , no concern from her or herfamily  Depression: No anhedonia unusual crying or depressive symptoms  Nutrition: Eats well balanced diet; adequate calcium and vitamin D. No swallowing chewing problems.  Injury: no major injuries in the last six months.  Other healthcare providers:  Reviewed  today .  Social:  Lives with spouse married.   hh of 5 pet dog   Preventive parameters: up-to-date  Reviewed   ADLS:   There are no problems or need for assistance  driving, feeding, obtaining food, dressing, toileting and bathing, managing money using phone. She is independent.   ROS:  GEN/ HEENT: No fever, significant weight changes sweats headaches vision problems hearing changes, CV/ PULM; No chest pain shortness of breath cough, syncope,edema  change in exercise tolerance. GI /GU: No adominal pain, vomiting, change in bowel habits. No blood in the stool. No significant GU symptoms. SKIN/HEME: ,no acute skin rashes suspicious lesions or bleeding. No lymphadenopathy, nodules, masses.  NEURO/ PSYCH:  No neurologic signs such as weakness numbness. No depression anxiety. IMM/ Allergy: No unusual infections.  Allergy .   REST of 12 system review negative except as per HPI   Past Medical History  Diagnosis Date  . Hiatal hernia   . Esophageal reflux   . Migraines   . Hyperlipemia   . Crohn's   . Eczema   . Arthritis   . Cataract   . Rosacea   . Skin cancer of face     scca removed     Family History  Problem Relation Age of Onset  . Parkinsonism Mother   . Lung cancer Mother   . Heart  disease Father     died chf in 73 s  . Hypertension Father   . Breast cancer Cousin   . Congestive Heart Failure Father     History   Social History  . Marital Status: Married    Spouse Name: N/A    Number of Children: 2  . Years of Education: N/A   Occupational History  . retired      Education officer, museum  .     Social History Main Topics  . Smoking status: Never Smoker   . Smokeless tobacco: Never Used  . Alcohol Use: 0.5 - 1.0 oz/week    1-2 drink(s) per week     Comment: occ  . Drug Use: No  . Sexual Activity: Yes    Partners: Male    Birth Control/ Protection: Surgical     Comment: hysterectomy   Other Topics Concern  . None   Social History Narrative   hh  Of 2    hh of 5  Husband and daughter adn 2 children 32 and 10  For now.    Dog    Married    G2P2    Outpatient Encounter Prescriptions as of 11/13/2013  Medication Sig  . Azelaic Acid (FINACEA) 15 % cream Apply topically 2 (two) times daily. After skin is thoroughly washed and patted dry, gently but thoroughly massage a thin film of azelaic acid cream into the affected area twice daily, in the morning and evening.  . cetirizine (ZYRTEC) 10 MG tablet Take 10 mg by mouth daily as needed for allergies.  . cyclobenzaprine (FLEXERIL) 10 MG tablet 1/2 to 1 hs as needed  . diphenhydrAMINE (BENADRYL) 25 MG tablet Take 25 mg by mouth at bedtime as needed. For sleep  . Estradiol (VAGIFEM) 10 MCG TABS vaginal tablet Place 1 tablet (10 mcg total) vaginally as directed.  . fluocinonide cream (LIDEX) 6.96 % Apply 1 application topically daily as needed. Use as directed when needed for eczema  . fluticasone (FLONASE) 50 MCG/ACT nasal spray Place 2 sprays into the nose daily.  Marland Kitchen ipratropium (ATROVENT) 0.06 % nasal spray USE 2 SPRAYS IN EACH NOSTRIL 2-3 TIMES A DAY  . Lactobacillus Rhamnosus, GG, (CULTURELLE PO) Take by mouth daily.  . mesalamine (PENTASA) 500 MG CR capsule Take three tablet by mouth twice a day  . MULTIPLE VITAMIN PO Take 1 tablet by mouth daily.   . Omega-3 Fatty Acids (FISH OIL PO) Take 1 capsule by mouth daily.    Marland Kitchen omeprazole (PRILOSEC) 40 MG capsule Take 1 capsule (40 mg total) by mouth daily.  . simvastatin (ZOCOR) 40 MG tablet Take 1 tablet (40 mg total) by mouth at bedtime.  . SUMAtriptan (IMITREX) 100 MG tablet Take 100 mg by mouth as needed. For Migraine  . [DISCONTINUED] cyclobenzaprine (FLEXERIL) 10 MG tablet TAKE 1/2 TABLET TWICE A DAY AS NEEDED  . [DISCONTINUED] ipratropium (ATROVENT) 0.06 % nasal spray USE 2 SPRAYS IN EACH NOSTRIL 2-3 TIMES A DAY  . [DISCONTINUED] simvastatin (ZOCOR) 40 MG tablet Take 1 tablet (40 mg total) by mouth at bedtime.  . phenazopyridine (PYRIDIUM) 200 MG  tablet Take 1 tablet (200 mg total) by mouth 3 (three) times daily as needed for pain.  Marland Kitchen zolpidem (AMBIEN) 10 MG tablet 10 mg at bedtime as needed.  . [DISCONTINUED] ciprofloxacin (CIPRO) 250 MG tablet Take 1 tablet (250 mg total) by mouth 2 (two) times daily.  . [DISCONTINUED] nitrofurantoin, macrocrystal-monohydrate, (MACROBID) 100 MG capsule Take 1 capsule (100  mg total) by mouth 2 (two) times daily.    EXAM:  BP 128/64  Temp(Src) 98.7 F (37.1 C) (Oral)  Ht 5' 5.25" (1.657 m)  Wt 140 lb (63.504 kg)  BMI 23.13 kg/m2  LMP 04/19/1992  Body mass index is 23.13 kg/(m^2).  Physical Exam: Vital signs reviewed NOM:VEHM is a well-developed well-nourished alert cooperative   who appears stated age in no acute distress.  HEENT: normocephalic atraumatic , Eyes: PERRL EOM's full, conjunctiva clear, Nares: paten,t no deformity discharge or tenderness., Ears: no deformity EAC's clear TMs with normal landmarks. Mouth: clear OP, no lesions, edema.  Moist mucous membranes. Dentition in adequate repair. NECK: supple without masses, thyromegaly or bruits. CHEST/PULM:  Clear to auscultation and percussion breath sounds equal no wheeze , rales or rhonchi. No chest wall deformities or tenderness. Breast: normal by inspection . No dimpling, discharge, masses, tenderness or discharge . Old scar noted  CV: PMI is nondisplaced, S1 S2 no gallops, murmurs, rubs. Peripheral pulses are full without delay.No JVD .  ABDOMEN: Bowel sounds normal nontender  No guard or rebound, no hepato splenomegal no CVA tenderness.   Extremtities:  No clubbing cyanosis or edema, no acute joint swelling or redness no focal atrophy NEURO:  Oriented x3, cranial nerves 3-12 appear to be intact, no obvious focal weakness,gait within normal limits no abnormal reflexes or asymmetrical SKIN: No acute rashes normal turgor, color, no bruising or petechiae. PSYCH: Oriented, good eye contact, no obvious depression anxiety, cognition and  judgment appear normal. LN: no cervical axillary inguinal adenopathy No noted deficits in memory, attention, and speech.   ASSESSMENT AND PLAN:  Discussed the following assessment and plan:  Medicare annual wellness visit, subsequent - td prevnar today  - Plan: Basic metabolic panel, CBC with Differential, Hepatic function panel, Lipid panel, TSH  Need for tetanus booster - Plan: Td vaccine greater than or equal to 7yo preservative free IM, Basic metabolic panel, CBC with Differential, Hepatic function panel, Lipid panel, TSH  Need for vaccination with 13-polyvalent pneumococcal conjugate vaccine - Plan: Pneumococcal conjugate vaccine [id number], Basic metabolic panel, CBC with Differential, Hepatic function panel, Lipid panel, TSH  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 jhas used many stratgeis ambien causes fogginess uses benadryl at times - Plan: Basic metabolic panel, CBC with Differential, Hepatic function panel, Lipid panel, TSH  Hearing aid worn  Allergic rhinitis, cause unspecified - cont atrovent prevnar  Td  Will send copy lab to dr Glade Lloyd for ocass neck pain stiffness with caution Patient Care Team: Burnis Medin, MD as PCP - General Sable Feil, MD (Gastroenterology) Pedro Earls, MD (Family Medicine) Amy Y Martinique, MD (Dermatology) Lyman Speller, MD (Gynecology) Orie Rout, MD as Referring Physician (Specialist) Griffith Citron, MD (Ophthalmology)  Patient Instructions  Continue lifestyle intervention healthy eating and exercise . Healthy lifestyle includes : At  least 150 minutes of exercise weeks  , weight at healthy levels, which is usually   BMI 19-25. Avoid trans fats and processed foods;  Increase fresh fruits and veges to 5 servings per day. And avoid sweet beverages including tea and juice. Mediterranean diet with olive oil  and nuts have been noted to be heart and brain healthy . Avoid tobacco products . Limit  alcohol to  7 per week for women and 14 servings for men.  Get adequate sleep .  Will notify you  of labs when available. colonoscopy as per GI mammogram yearly as discussed  Check yearly     Wanda K. Panosh M.D.

## 2014-01-07 ENCOUNTER — Other Ambulatory Visit: Payer: Self-pay

## 2014-01-07 ENCOUNTER — Telehealth: Payer: Self-pay | Admitting: Internal Medicine

## 2014-01-07 MED ORDER — IPRATROPIUM BROMIDE 0.06 % NA SOLN: NASAL | Status: DC

## 2014-01-07 NOTE — Telephone Encounter (Signed)
Last AEX: 07/03/13 Last refill:07/03/13 #24 X 4 Current AEX: 07/09/14  Please advise

## 2014-01-07 NOTE — Telephone Encounter (Signed)
Rx sent to pharmacy   

## 2014-01-07 NOTE — Telephone Encounter (Signed)
[id number] [place] [place] [address] [place] [place] requesting 90 day re-fill on ipratropium (ATROVENT) 0.06 % nasal spray

## 2014-01-07 NOTE — Telephone Encounter (Signed)
Called pharmacy. Crystal states pt has enough refills and that the refill request was generated. Encounter closed

## 2014-01-07 NOTE — Telephone Encounter (Signed)
Can you please call pharmacy and see why RF needed.  I did a 90 day supply with 4 RFs.  That should have been enough.

## 2014-01-12 ENCOUNTER — Encounter: Payer: Self-pay | Admitting: Gastroenterology

## 2014-01-25 DIAGNOSIS — Z23 Encounter for immunization: Secondary | ICD-10-CM | POA: Diagnosis not present

## 2014-02-01 ENCOUNTER — Other Ambulatory Visit: Payer: Self-pay

## 2014-02-18 ENCOUNTER — Encounter: Payer: Self-pay | Admitting: Internal Medicine

## 2014-04-03 ENCOUNTER — Other Ambulatory Visit: Payer: Self-pay | Admitting: Internal Medicine

## 2014-04-03 NOTE — Telephone Encounter (Signed)
Sent to the pharmacy by e-scribe. 

## 2014-04-08 ENCOUNTER — Other Ambulatory Visit: Payer: Self-pay | Admitting: Obstetrics & Gynecology

## 2014-04-16 ENCOUNTER — Other Ambulatory Visit: Payer: Self-pay | Admitting: Internal Medicine

## 2014-04-16 NOTE — Telephone Encounter (Signed)
Ok flexeril x 1  Prefer to try other than ambien  Has she tried remeron 15 - 30 mg hx  Or trazadone 25 - 50 mg at night?

## 2014-04-18 MED ORDER — MIRTAZAPINE 15 MG PO TABS: ORAL_TABLET | ORAL | Status: DC

## 2014-04-18 NOTE — Telephone Encounter (Signed)
Sent to the pharmacy by e-scribe. 

## 2014-04-18 NOTE — Telephone Encounter (Signed)
Spoke to pt.  Agreed to med change.  Has not tried Remeron or trazadone.  Please advise.  Thanks!

## 2014-04-18 NOTE — Telephone Encounter (Signed)
remeron 15 mg  Take 1 - 2 hs for sleep  Disp 40 refill x 1

## 2014-05-14 DIAGNOSIS — H5203 Hypermetropia, bilateral: Secondary | ICD-10-CM | POA: Diagnosis not present

## 2014-05-14 DIAGNOSIS — H25813 Combined forms of age-related cataract, bilateral: Secondary | ICD-10-CM | POA: Diagnosis not present

## 2014-05-14 DIAGNOSIS — H52223 Regular astigmatism, bilateral: Secondary | ICD-10-CM | POA: Diagnosis not present

## 2014-06-07 ENCOUNTER — Other Ambulatory Visit: Payer: Self-pay | Admitting: Gastroenterology

## 2014-06-11 ENCOUNTER — Ambulatory Visit (INDEPENDENT_AMBULATORY_CARE_PROVIDER_SITE_OTHER): Payer: Medicare Other | Admitting: Internal Medicine

## 2014-06-11 ENCOUNTER — Encounter: Payer: Self-pay | Admitting: Internal Medicine

## 2014-06-11 VITALS — BP 124/68 | HR 80 | Ht 65.35 in

## 2014-06-11 DIAGNOSIS — K219 Gastro-esophageal reflux disease without esophagitis: Secondary | ICD-10-CM | POA: Diagnosis not present

## 2014-06-11 DIAGNOSIS — K5090 Crohn's disease, unspecified, without complications: Secondary | ICD-10-CM | POA: Diagnosis not present

## 2014-06-11 MED ORDER — OMEPRAZOLE 20 MG PO CPDR: 20.0000 mg | DELAYED_RELEASE_CAPSULE | Freq: Every day | ORAL | Status: DC

## 2014-06-11 MED ORDER — MESALAMINE ER 500 MG PO CPCR: ORAL_CAPSULE | ORAL | Status: DC

## 2014-06-11 NOTE — Patient Instructions (Signed)
We have sent the following medications to your pharmacy for you to pick up at your convenience: Omeprazole 20 mg daily (decrease from 40 mg daily) Pentasa 3 tablets twice daily  Please follow up with Dr Hilarie Fredrickson in 1 year.  Please discuss having a bone scan with your GYN.  When Dr Regis Bill draws labs, you will also need b12 and magnesium drawn.  CC:Dr Panosh

## 2014-06-11 NOTE — Progress Notes (Signed)
Subjective:    Patient ID: Bridget Reyes, female    DOB: 04-14-1946, 69 y.o.   MRN: [id number]  HPI Bridget Reyes is a 69 year old female with a past medical history of Crohn's ileitis, GERD with hiatal hernia, hyperlipidemia and migraines who is seen in follow-up. She was followed long-term Dr. Sharlett Iles prior to his retirement and this is her first visit with me. It appears that she has had Crohn's disease for nearly 20 years. On high records her first pathology record indicating ileitis was in December 1997. With each subsequent colonoscopy she has had chronic active ileitis with each examination. Her last colonoscopy was performed by Dr. Sharlett Iles on 08/24/2010. This revealed aphthous erosions in the distal 10 cm of terminal ileum. They were biopsied. He also remarks on scattered erosions and focal colitis scattered throughout the colon. I do not see that her: His ever been biopsied.  She reports that she is feeling well. She denies abdominal pain. Her bowel movements have been normal. She denies diarrhea or constipation. She is maintained on Pentasa 1.5 g twice daily. She takes a multivitamin. No blood in her stool or melena. No weight loss. She has a history of GERD but she reports this is well controlled on omeprazole 40 mg daily. Her symptoms primarily were vocal cord inflammation hoarseness and throat clearing. She denies dysphagia or odynophagia. No nausea or vomiting. No hepatobiliary complaint.  She follows with Dr. Regis Bill performs her routine labs and also GYN. She does recall  Bone scan within the last 3 years without evidence of osteopenia or porosis  Current Medications, Allergies, Past Medical History, Past Surgical History, Family History and Social History were reviewed in [employer] record.   Review of Systems As per history of present illness, otherwise negative    Objective:   Physical Exam BP 124/68 mmHg  Pulse 80  Ht 5' 5.35" (1.66 m)  LMP  04/19/1992 Constitutional: Well-developed and well-nourished. No distress. HEENT: Normocephalic and atraumatic. Oropharynx is clear and moist. No oropharyngeal exudate. Conjunctivae are normal.  No scleral icterus. Neck: Neck supple. Trachea midline. Cardiovascular: Normal rate, regular rhythm and intact distal pulses. No M/R/G Pulmonary/chest: Effort normal and breath sounds normal. No wheezing, rales or rhonchi. Abdominal: Soft, mild right lower quadrant tenderness without rebound or guarding and no other tenderness throughout the abdomen, nondistended. Bowel sounds active throughout. There are no masses palpable. No hepatosplenomegaly. Extremities: no clubbing, cyanosis, or edema Lymphadenopathy: No cervical adenopathy noted. Neurological: Alert and oriented to person place and time. Skin: Skin is warm and dry. No rashes noted. Psychiatric: Normal mood and affect. Behavior is normal.  CBC    Component Value Date/Time   WBC 5.1 11/13/2013 1016   RBC 4.41 11/13/2013 1016   HGB 14.0 11/13/2013 1016   HCT 41.4 11/13/2013 1016   PLT 218.0 11/13/2013 1016   MCV 93.9 11/13/2013 1016   MCHC 33.7 11/13/2013 1016   RDW 12.6 11/13/2013 1016   LYMPHSABS 1.2 11/13/2013 1016   MONOABS 0.4 11/13/2013 1016   EOSABS 0.0 11/13/2013 1016   BASOSABS 0.0 11/13/2013 1016    CMP     Component Value Date/Time   NA 138 11/13/2013 1016   K 3.9 11/13/2013 1016   CL 102 11/13/2013 1016   CO2 31 11/13/2013 1016   GLUCOSE 99 11/13/2013 1016   GLUCOSE 91 04/27/2006 1058   BUN 14 11/13/2013 1016   CREATININE 0.8 11/13/2013 1016   CALCIUM 9.5 11/13/2013 1016   PROT 7.2  11/13/2013 1016   ALBUMIN 4.3 11/13/2013 1016   AST 29 11/13/2013 1016   ALT 28 11/13/2013 1016   ALKPHOS 71 11/13/2013 1016   BILITOT 0.8 11/13/2013 1016   GFRNONAA 80.08 10/10/2009 0811   GFRAA 82 06/08/2007 0831    Lab Results  Component Value Date   VITAMINB12 695 08/18/2012       Assessment & Plan:  69 year old female  with a past medical history of Crohn's ileitis, GERD with hiatal hernia, hyperlipidemia and migraines who is seen in follow-up.  1. Crohns ileitis -- chronic active ileitis has been seen on multiple colonoscopies. He remarked about erosions throughout the colon but I cannot see that the colon was ever biopsied. And for this reason I'm unsure if her Crohn's disease involves her colon or not. It does not appear she has a history of adenomatous colon polyps. She has maintained clinical remission. I recommend we continue Pentasa 1.5 g twice daily. She is encouraged to continue annual flu vaccine and stay up-to-date on Pneumovax. I've also recommended her B12 be monitored with her routine labs performed by primary care. Surveillance colonoscopy interval is difficult to determine given I'm not sure if the Crohn's involves her colon. If it does it would indicate the need for more frequent surveillance colonoscopy given her long-standing disease. Dr. Sharlett Iles had recommended May 2017 and this is reasonable given how well she is doing. If colitis is still seen, colon biopsies will be performed.  2. GERD -- sounds that most of her symptoms are atypical. She inquires about potential negative consequences of long-term PPI therapy. We discussed aspiration pneumonia, C. difficile colitis, osteopenia and osteoporosis related to calcium metabolism, hypomagnesemia, and more recent data raising the question of dementia. I've encouraged her to stay current with bone scans and she will discuss this with her GYN doctor. I also am going to reduce the dose of omeprazole to 20 mg daily as a trial. If she has recurrent heartburn or laryngeal symptoms she is asked to notify me immediately. We also discussed stopping medication altogether or changing to twice daily H2 blocker but after a discussion we both decided to try reduce dose of omeprazole. She will notify me if she notices any return of heartburn or GERD related symptoms. No  signs of Barrett's esophagus on previous endoscopy. Also encouraged her to have her magnesium checked annually with basic labs by primary care. She prefers to have all of her last performed by primary care.  Follow-up in one year, sooner if necessary

## 2014-07-09 ENCOUNTER — Encounter: Payer: Self-pay | Admitting: Obstetrics & Gynecology

## 2014-07-09 ENCOUNTER — Ambulatory Visit (INDEPENDENT_AMBULATORY_CARE_PROVIDER_SITE_OTHER): Payer: Medicare Other | Admitting: Obstetrics & Gynecology

## 2014-07-09 VITALS — BP 120/60 | HR 88 | Resp 16 | Ht 65.5 in | Wt 142.0 lb

## 2014-07-09 DIAGNOSIS — Z01419 Encounter for gynecological examination (general) (routine) without abnormal findings: Secondary | ICD-10-CM | POA: Diagnosis not present

## 2014-07-09 DIAGNOSIS — M8580 Other specified disorders of bone density and structure, unspecified site: Secondary | ICD-10-CM | POA: Diagnosis not present

## 2014-07-09 DIAGNOSIS — Z124 Encounter for screening for malignant neoplasm of cervix: Secondary | ICD-10-CM

## 2014-07-09 DIAGNOSIS — N952 Postmenopausal atrophic vaginitis: Secondary | ICD-10-CM | POA: Diagnosis not present

## 2014-07-09 MED ORDER — ESTRADIOL 10 MCG VA TABS: 1.0000 | ORAL_TABLET | VAGINAL | Status: DC

## 2014-07-09 NOTE — Progress Notes (Signed)
69 y.o. G2P2 MarriedCaucasianF here for annual exam.  Doing well.  Pt seen twice last year for two UTIs.  H/O recurrent UTIs in her 40's.  Was on suppression when this happened.  No vaginal bleeding.  Needs order for BMD due to being on Omeprazole for many years.  Gastroenterologist recommended.  Dr. Regis Bill:  PCP.  Labs UTD.  Tetanus and Prevnar vaccine given.  Long term issues with vaginal dryness.  Used Estring in the past.  It actually got "stuck" and couldn't remove it.  Since then, has develop granulation tissue where this was "stuck"   Patient's last menstrual period was 04/19/1992.          Sexually active: Yes.    The current method of family planning is status post hysterectomy.    Exercising: Yes.    Curves and walking Smoker:  no  Health Maintenance: Pap: 07/09/13 Neg History of abnormal Pap:  no MMG: 10/24/13 BIRADS1:Neg Colonoscopy:  08/2010 Crohn's disease. Repeat every 5 years. BMD:   09/2010 - normal  TDaP: 2015 Screening Labs: PCP, Hb today: PCP, Urine today: PCP   reports that she has never smoked. She has never used smokeless tobacco. She reports that she drinks about 0.5 - 1.0 oz of alcohol per week. She reports that she does not use illicit drugs.  Past Medical History  Diagnosis Date  . Hiatal hernia   . Esophageal reflux   . Migraines   . Hyperlipemia   . Crohn's   . Eczema   . Arthritis   . Cataract   . Rosacea   . Skin cancer of face     scca removed     Past Surgical History  Procedure Laterality Date  . Total vaginal hysterectomy  1994  . Tonsillectomy    . Breast biopsy  1980    left  . Colonoscopy  2012    Current Outpatient Prescriptions  Medication Sig Dispense Refill  . Azelaic Acid (FINACEA) 15 % cream Apply topically 2 (two) times daily. After skin is thoroughly washed and patted dry, gently but thoroughly massage a thin film of azelaic acid cream into the affected area twice daily, in the morning and evening.    . cetirizine (ZYRTEC) 10  MG tablet Take 10 mg by mouth daily as needed for allergies.    . cyclobenzaprine (FLEXERIL) 10 MG tablet TAKE 1/2 TABLET BY MOUTH TWICE DAILY AS NEEDED 30 tablet 0  . diphenhydrAMINE (BENADRYL) 25 MG tablet Take 25 mg by mouth at bedtime as needed. For sleep    . Estradiol (VAGIFEM) 10 MCG TABS vaginal tablet Place 1 tablet (10 mcg total) vaginally as directed. 24 tablet 4  . fluocinonide cream (LIDEX) 1.19 % Apply 1 application topically daily as needed. Use as directed when needed for eczema 30 g 1  . fluticasone (FLONASE) 50 MCG/ACT nasal spray Place 2 sprays into the nose daily. 16 g 11  . ipratropium (ATROVENT) 0.06 % nasal spray USE 2 SPRAYS IN EACH NOSTRIL 2-3 TIMES A DAY 15 mL 4  . Lactobacillus Rhamnosus, GG, (CULTURELLE PO) Take by mouth daily.    . mesalamine (PENTASA) 500 MG CR capsule Take three tablet by mouth twice a day 540 capsule 3  . MULTIPLE VITAMIN PO Take 1 tablet by mouth daily.     . Omega-3 Fatty Acids (FISH OIL PO) Take 1 capsule by mouth daily.      Marland Kitchen omeprazole (PRILOSEC) 20 MG capsule Take 1 capsule (20 mg total) by  mouth daily. 90 capsule 3  . simvastatin (ZOCOR) 40 MG tablet Take 1 tablet (40 mg total) by mouth at bedtime. 90 tablet 3  . SUMAtriptan (IMITREX) 100 MG tablet Take 100 mg by mouth as needed. For Migraine    . mirtazapine (REMERON) 15 MG tablet TAKE 1-2 TABS AT NIGHT FOR SLEEP (Patient not taking: Reported on 07/09/2014) 40 tablet 1   No current facility-administered medications for this visit.    Family History  Problem Relation Age of Onset  . Parkinsonism Mother   . Lung cancer Mother   . Heart disease Father     died chf in 69 s  . Hypertension Father   . Breast cancer Cousin   . Congestive Heart Failure Father     ROS:  Pertinent items are noted in HPI.  Otherwise, a comprehensive ROS was negative.  Exam:   BP 120/60 mmHg  Pulse 88  Resp 16  Ht 5' 5.5" (1.664 m)  Wt 142 lb (64.411 kg)  BMI 23.26 kg/m2  LMP 04/19/1992  Weight  change: +2#  Height: 5' 5.5" (166.4 cm)  Ht Readings from Last 3 Encounters:  07/09/14 5' 5.5" (1.664 m)  06/11/14 5' 5.35" (1.66 m)  11/13/13 5' 5.25" (1.657 m)    General appearance: alert, cooperative and appears stated age Head: Normocephalic, without obvious abnormality, atraumatic Neck: no adenopathy, supple, symmetrical, trachea midline and thyroid normal to inspection and palpation Lungs: clear to auscultation bilaterally Breasts: normal appearance, no masses or tenderness Heart: regular rate and rhythm Abdomen: soft, non-tender; bowel sounds normal; no masses,  no organomegaly Extremities: extremities normal, atraumatic, no cyanosis or edema Skin: Skin color, texture, turgor normal. No rashes or lesions Lymph nodes: Cervical, supraclavicular, and axillary nodes normal. No abnormal inguinal nodes palpated Neurologic: Grossly normal   Pelvic: External genitalia:  no lesions              Urethra:  normal appearing urethra with no masses, tenderness or lesions              Bartholins and Skenes: normal                 Vagina: normal appearing vagina with normal color and discharge, posterior vaginal granulation tissue still present.  Bleeds upon touching with fox swab.              Cervix: absent              Pap taken: No. Bimanual Exam:  Uterus:  uterus absent              Adnexa: no mass, fullness, tenderness               Rectovaginal: Confirms               Anus:  normal sphincter tone, no lesions  Chaperone was present for exam.  A:  Well Woman with normal exam PMP H/O TVH H/O vaginal ulceration/granulation tissue after Estring was "stuck" on vagina. Biopsy of this tissue has been negative. Vaginal atrophic changes H/O Crohn's ileitis--followed by Dr. Sharlett Iles  P: Mammogram yearly. Grade b breast density pap smear neg 2015.  No pap today.   Referral will be made to Dr. Helane Rima.  (Pt has friend doing laser therapy to vagina due to atrophic changes.)  I  think this would probably allow the granulation tissue present to heal as well.  Pt is aware of cost and need for continued yearly treatment. Continue vagifem 42meq  pv twice weekly. Rx to pharmacy. return annually or prn

## 2014-07-26 ENCOUNTER — Telehealth: Payer: Self-pay | Admitting: Obstetrics & Gynecology

## 2014-07-26 NOTE — Telephone Encounter (Signed)
Call to patient. Advised that she is scheduled with Dr Matthew Saras on April 19 @ 11am. Patient states this appointment will not work for her for several reasons and that she will contact them to reschedule.  Provided patient with their telephone # .  Patient agreeable.

## 2014-07-28 ENCOUNTER — Other Ambulatory Visit: Payer: Self-pay | Admitting: Obstetrics & Gynecology

## 2014-07-29 NOTE — Telephone Encounter (Signed)
Medication refill request: Vagifem  Last AEX:  07/09/14 SM Next AEX: 09/16/15 SM Last MMG (if hormonal medication request): 10/25/13 BIRADS1:neg Refill authorized: 07/09/14 #24 tab/ 4R to [facility].

## 2014-08-07 ENCOUNTER — Other Ambulatory Visit: Payer: Self-pay | Admitting: Internal Medicine

## 2014-08-12 DIAGNOSIS — N952 Postmenopausal atrophic vaginitis: Secondary | ICD-10-CM | POA: Diagnosis not present

## 2014-08-20 DIAGNOSIS — H903 Sensorineural hearing loss, bilateral: Secondary | ICD-10-CM | POA: Diagnosis not present

## 2014-08-20 DIAGNOSIS — Z974 Presence of external hearing-aid: Secondary | ICD-10-CM | POA: Diagnosis not present

## 2014-08-20 DIAGNOSIS — H6123 Impacted cerumen, bilateral: Secondary | ICD-10-CM | POA: Diagnosis not present

## 2014-08-29 DIAGNOSIS — G43019 Migraine without aura, intractable, without status migrainosus: Secondary | ICD-10-CM | POA: Diagnosis not present

## 2014-09-18 ENCOUNTER — Other Ambulatory Visit: Payer: Self-pay

## 2014-09-18 DIAGNOSIS — Z1231 Encounter for screening mammogram for malignant neoplasm of breast: Secondary | ICD-10-CM

## 2014-10-24 DIAGNOSIS — H903 Sensorineural hearing loss, bilateral: Secondary | ICD-10-CM | POA: Diagnosis not present

## 2014-11-05 ENCOUNTER — Ambulatory Visit
Admission: RE | Admit: 2014-11-05 | Discharge: 2014-11-05 | Disposition: A | Discharge status: Home / Self Care | Payer: Medicare Other | Source: Ambulatory Visit | Attending: Obstetrics & Gynecology | Admitting: Obstetrics & Gynecology

## 2014-11-05 ENCOUNTER — Ambulatory Visit
Admission: RE | Admit: 2014-11-05 | Discharge: 2014-11-05 | Disposition: A | Discharge status: Home / Self Care | Payer: Medicare Other | Source: Ambulatory Visit

## 2014-11-05 DIAGNOSIS — Z780 Asymptomatic menopausal state: Secondary | ICD-10-CM | POA: Diagnosis not present

## 2014-11-05 DIAGNOSIS — Z13820 Encounter for screening for osteoporosis: Secondary | ICD-10-CM | POA: Diagnosis not present

## 2014-11-05 DIAGNOSIS — M8580 Other specified disorders of bone density and structure, unspecified site: Secondary | ICD-10-CM

## 2014-11-05 DIAGNOSIS — Z1231 Encounter for screening mammogram for malignant neoplasm of breast: Secondary | ICD-10-CM | POA: Diagnosis not present

## 2014-11-15 ENCOUNTER — Ambulatory Visit (INDEPENDENT_AMBULATORY_CARE_PROVIDER_SITE_OTHER): Payer: Medicare Other | Admitting: Internal Medicine

## 2014-11-15 ENCOUNTER — Encounter: Payer: Self-pay | Admitting: Internal Medicine

## 2014-11-15 VITALS — BP 124/74 | Temp 97.4°F | Ht 65.25 in | Wt 147.3 lb

## 2014-11-15 DIAGNOSIS — G4700 Insomnia, unspecified: Secondary | ICD-10-CM | POA: Diagnosis not present

## 2014-11-15 DIAGNOSIS — K5080 Crohn's disease of both small and large intestine without complications: Secondary | ICD-10-CM | POA: Diagnosis not present

## 2014-11-15 DIAGNOSIS — Z974 Presence of external hearing-aid: Secondary | ICD-10-CM | POA: Diagnosis not present

## 2014-11-15 DIAGNOSIS — Z0000 Encounter for general adult medical examination without abnormal findings: Secondary | ICD-10-CM

## 2014-11-15 DIAGNOSIS — R208 Other disturbances of skin sensation: Secondary | ICD-10-CM

## 2014-11-15 DIAGNOSIS — L719 Rosacea, unspecified: Secondary | ICD-10-CM

## 2014-11-15 DIAGNOSIS — E785 Hyperlipidemia, unspecified: Secondary | ICD-10-CM | POA: Diagnosis not present

## 2014-11-15 LAB — LIPID PANEL
CHOL/HDL RATIO: 3
CHOLESTEROL: 142 mg/dL (ref 0–200)
HDL: 50.7 mg/dL (ref >39.00)
LDL Cholesterol: 73 mg/dL (ref 0–99)
NONHDL: 91.52
Triglycerides: 95 mg/dL (ref 0.0–149.0)
VLDL: 19 mg/dL (ref 0.0–40.0)

## 2014-11-15 LAB — CBC WITH DIFFERENTIAL/PLATELET
BASOS ABS: 0 10*3/uL (ref 0.0–0.1)
Basophils Relative: 0.6 % (ref 0.0–3.0)
EOS PCT: 2.7 % (ref 0.0–5.0)
Eosinophils Absolute: 0.2 10*3/uL (ref 0.0–0.7)
HCT: 43 % (ref 36.0–46.0)
HEMOGLOBIN: 14.5 g/dL (ref 12.0–15.0)
LYMPHS ABS: 1.1 10*3/uL (ref 0.7–4.0)
Lymphocytes Relative: 19.1 % (ref 12.0–46.0)
MCHC: 33.7 g/dL (ref 30.0–36.0)
MCV: 94.3 fl (ref 78.0–100.0)
MONO ABS: 0.4 10*3/uL (ref 0.1–1.0)
Monocytes Relative: 7.6 % (ref 3.0–12.0)
NEUTROS PCT: 70 % (ref 43.0–77.0)
Neutro Abs: 4.1 10*3/uL (ref 1.4–7.7)
Platelets: 234 10*3/uL (ref 150.0–400.0)
RBC: 4.56 Mil/uL (ref 3.87–5.11)
RDW: 11.9 % (ref 11.5–15.5)
WBC: 5.8 10*3/uL (ref 4.0–10.5)

## 2014-11-15 LAB — BASIC METABOLIC PANEL
BUN: 14 mg/dL (ref 6–23)
CALCIUM: 9.5 mg/dL (ref 8.4–10.5)
CHLORIDE: 103 meq/L (ref 96–112)
CO2: 30 mEq/L (ref 19–32)
CREATININE: 0.83 mg/dL (ref 0.40–1.20)
GFR: 72.31 mL/min (ref >60.00)
Glucose, Bld: 94 mg/dL (ref 70–99)
POTASSIUM: 4.4 meq/L (ref 3.5–5.1)
SODIUM: 138 meq/L (ref 135–145)

## 2014-11-15 LAB — POCT URINALYSIS DIP (MANUAL ENTRY)
BILIRUBIN UA: NEGATIVE
Blood, UA: NEGATIVE
Glucose, UA: NEGATIVE
Ketones, POC UA: NEGATIVE
Leukocytes, UA: NEGATIVE
Nitrite, UA: NEGATIVE
PROTEIN UA: NEGATIVE
Spec Grav, UA: 1.015
UROBILINOGEN UA: 0.2
pH, UA: 7

## 2014-11-15 LAB — IBC PANEL
IRON: 87 ug/dL (ref 42–145)
SATURATION RATIOS: 24.4 % (ref 20.0–50.0)
Transferrin: 255 mg/dL (ref 212.0–360.0)

## 2014-11-15 LAB — HEPATIC FUNCTION PANEL
ALT: 21 U/L (ref 0–35)
AST: 20 U/L (ref 0–37)
Albumin: 4.3 g/dL (ref 3.5–5.2)
Alkaline Phosphatase: 82 U/L (ref 39–117)
BILIRUBIN TOTAL: 0.4 mg/dL (ref 0.2–1.2)
Bilirubin, Direct: 0.1 mg/dL (ref 0.0–0.3)
Total Protein: 7.3 g/dL (ref 6.0–8.3)

## 2014-11-15 LAB — VITAMIN B12: VITAMIN B 12: 468 pg/mL (ref 211–911)

## 2014-11-15 LAB — FERRITIN: Ferritin: 63.7 ng/mL (ref 10.0–291.0)

## 2014-11-15 LAB — SEDIMENTATION RATE: Sed Rate: 13 mm/hr (ref 0–22)

## 2014-11-15 LAB — TSH: TSH: 2.61 u[IU]/mL (ref 0.35–4.50)

## 2014-11-15 MED ORDER — AZELAIC ACID 15 % EX GEL: CUTANEOUS | Status: DC

## 2014-11-15 MED ORDER — SUVOREXANT 10 MG PO TABS: 10.0000 mg | ORAL_TABLET | Freq: Every evening | ORAL | Status: DC | PRN

## 2014-11-15 MED ORDER — IPRATROPIUM BROMIDE 0.06 % NA SOLN: NASAL | Status: DC

## 2014-11-15 MED ORDER — CYCLOBENZAPRINE HCL 10 MG PO TABS
ORAL_TABLET | ORAL | Status: DC
Start: 2014-11-15 — End: 2015-11-25

## 2014-11-15 MED ORDER — SIMVASTATIN 40 MG PO TABS: 40.0000 mg | ORAL_TABLET | Freq: Every day | ORAL | Status: DC

## 2014-11-15 NOTE — Progress Notes (Signed)
Pre visit review using our clinic review tool, if applicable. No additional management support is needed unless otherwise documented below in the visit note.  Chief Complaint  Patient presents with  . Medicare Wellness    cdm    HPI: Bridget Reyes 69 y.o. comes in today for Preventive Medicare wellness visit . And Chronic disease management a number of concerns   crohns . See note gi stable  .Marland KitchenHAs  Ok  Better !  HH :  dropping down acid blocker some without sx. To avoid ses long term   elevated lipids :  No prob with meds asks about goals  Sleep : remeron had increase appetite.so stopped right away  Needs something a times  Has s aaa'tried everythinga'  Using benadryl also sometimes "needs to sleep " and Lorrin Mais helps some.  Hearing aid    Sees ent,  Would like refills of meds   Including flexeril as needed for [facility] Maintenance  Topic Date Due  . INFLUENZA VACCINE  11/18/2014  . MAMMOGRAM  11/04/2016  . COLONOSCOPY  08/23/2020  . TETANUS/TDAP  11/14/2023  . DEXA SCAN  Completed  . ZOSTAVAX  Completed  . PNA vac Low Risk Adult  Completed   Health Maintenance Review LIFESTYLE:  Exercise:  yes Tobacco/ETS:no Alcohol: per day  1-2  At most about  2-3  Per week.  Sugar beverages: no Sleep:  5 hours    remeron   Drug use: no MEDICARE DOCUMENT QUESTIONS  TO SCAN     Hearing: hearing aid   Vision:  No limitations at present . Last eye check UTD  Safety:  Has smoke detector and wears seat belts.  No firearms. No excess sun exposure. Sees dentist regularly.  Falls: no  Advance directive :  Reviewed  Has one.  Memory: Felt to be good  , no concern from her or her family.  Depression: No anhedonia unusual crying or depressive symptoms  Nutrition: Eats well balanced diet; adequate calcium and vitamin D. No swallowing chewing problems.  Injury: no major injuries in the last six months.  Other healthcare providers:  Reviewed today .  Social:  Lives  with spouse married. h h of 5  1 dog   Daughter and kids getting a condo   Preventive parameters: up-to-date  Reviewed   ADLS:   There are no problems or need for assistance  driving, feeding, obtaining food, dressing, toileting and bathing, managing money using phone. She is independent.   ROS:  GEN/ HEENT: No fever, significant weight changes sweats headaches vision problems hearing changes, CV/ PULM; No chest pain shortness of breath cough, syncope,edema  change in exercise tolerance. GI /GU: No adominal pain, vomiting, change in bowel habits. No blood in the stool. No significant GU symptoms. SKIN/HEME: ,no acute skin rashes suspicious lesions or bleeding. No lymphadenopathy, nodules, masses.  NEURO/ PSYCH:  No neurologic signs such as weakness numbness. Balls o feet  Fel burning  No depression anxiety. IMM/ Allergy: No unusual infections.  Allergy .   REST of 12 system review negative except as per HPI   Past Medical History  Diagnosis Date  . Hiatal hernia   . Esophageal reflux   . Migraines   . Hyperlipemia   . Crohn's   . Eczema   . Arthritis   . Cataract   . Rosacea   . Skin cancer of face     scca removed     Family History  Problem Relation  Age of Onset  . Parkinsonism Mother   . Lung cancer Mother   . Heart disease Father     died chf in 60 s  . Hypertension Father   . Breast cancer Cousin   . Congestive Heart Failure Father     History   Social History  . Marital Status: Married    Spouse Name: N/A  . Number of Children: 2  . Years of Education: N/A   Occupational History  . retired      Education officer, museum  .     Social History Main Topics  . Smoking status: Never Smoker   . Smokeless tobacco: Never Used  . Alcohol Use: 0.5 - 1.0 oz/week    1-2 drink(s) per week     Comment: occ  . Drug Use: No  . Sexual Activity:    Partners: Male    Birth Control/ Protection: Surgical     Comment: hysterectomy   Other Topics Concern  . None   Social  History Narrative   hh  Of 2   hh of 5  Husband and daughter adn 2 children 15 and 10  For now.    Dog    Married    G2P2    Outpatient Encounter Prescriptions as of 11/15/2014  Medication Sig  . Azelaic Acid (FINACEA) 15 % cream After skin is thoroughly washed and patted dry, gently but thoroughly massage a thin film of azelaic acid cream into the affected area twice daily, in the morning and evening.  . cetirizine (ZYRTEC) 10 MG tablet Take 10 mg by mouth daily as needed for allergies.  . cyclobenzaprine (FLEXERIL) 10 MG tablet TAKE 1/2 TABLET BY MOUTH TWICE DAILY AS NEEDED  . diphenhydrAMINE (BENADRYL) 25 MG tablet Take 25 mg by mouth at bedtime as needed. For sleep  . fluocinonide cream (LIDEX) 1.60 % Apply 1 application topically daily as needed. Use as directed when needed for eczema  . fluticasone (FLONASE) 50 MCG/ACT nasal spray Place 2 sprays into the nose daily.  Marland Kitchen ipratropium (ATROVENT) 0.06 % nasal spray USE 2 SPRAYS IN EACH NOSTRIL 2-3 TIMES A DAY  . mesalamine (PENTASA) 500 MG CR capsule Take three tablet by mouth twice a day  . MULTIPLE VITAMIN PO Take 1 tablet by mouth daily.   . Omega-3 Fatty Acids (FISH OIL PO) Take 1 capsule by mouth daily.    Marland Kitchen omeprazole (PRILOSEC) 20 MG capsule Take 1 capsule (20 mg total) by mouth daily.  . simvastatin (ZOCOR) 40 MG tablet Take 1 tablet (40 mg total) by mouth at bedtime.  . SUMAtriptan (IMITREX) 100 MG tablet Take 100 mg by mouth as needed. For Migraine  . [DISCONTINUED] Azelaic Acid (FINACEA) 15 % cream Apply topically 2 (two) times daily. After skin is thoroughly washed and patted dry, gently but thoroughly massage a thin film of azelaic acid cream into the affected area twice daily, in the morning and evening.  . [DISCONTINUED] Azelaic Acid (FINACEA) 15 % cream After skin is thoroughly washed and patted dry, gently but thoroughly massage a thin film of azelaic acid cream into the affected area twice daily, in the morning and evening.    . [DISCONTINUED] cyclobenzaprine (FLEXERIL) 10 MG tablet TAKE 1/2 TABLET BY MOUTH TWICE DAILY AS NEEDED  . [DISCONTINUED] ipratropium (ATROVENT) 0.06 % nasal spray USE 2 SPRAYS IN EACH NOSTRIL 2-3 TIMES A DAY  . [DISCONTINUED] simvastatin (ZOCOR) 40 MG tablet Take 1 tablet (40 mg total) by mouth at bedtime.  Marland Kitchen  Estradiol (VAGIFEM) 10 MCG TABS vaginal tablet Place 1 tablet (10 mcg total) vaginally as directed. (Patient not taking: Reported on 11/15/2014)  . Lactobacillus Rhamnosus, GG, (CULTURELLE PO) Take by mouth daily.  . mirtazapine (REMERON) 15 MG tablet TAKE 1-2 TABS AT NIGHT FOR SLEEP (Patient not taking: Reported on 07/09/2014)  . Suvorexant (BELSOMRA) 10 MG TABS Take 10 mg by mouth at bedtime as needed.   No facility-administered encounter medications on file as of 11/15/2014.    EXAM:  BP 124/74 mmHg  Temp(Src) 97.4 F (36.3 C) (Oral)  Ht 5' 5.25" (1.657 m)  Wt 147 lb 4.8 oz (66.815 kg)  BMI 24.33 kg/m2  LMP 04/19/1992  Body mass index is 24.33 kg/(m^2).  Physical Exam: Vital signs reviewed PXT:GGYI is a well-developed well-nourished alert cooperative   who appears stated age in no acute distress.  HEENT: normocephalic atraumatic , Eyes: PERRL EOM's full, conjunctiva clear, Nares: paten,t no deformity discharge or tenderness., Ears: no deformity EAC's some wax grey tm has hearing aid TMs with normal landmarks. Hearing aid   Mouth: clear OP, no lesions, edema.  Moist mucous membranes. Dentition in adequate repair. NECK: supple without masses, thyromegaly or bruits. CHEST/PULM:  Clear to auscultation and percussion breath sounds equal no wheeze , rales or rhonchi. No chest wall deformities or tenderness. CV: PMI is nondisplaced, S1 S2 no gallops, murmurs, rubs. Peripheral pulses are full without delay.No JVD .Breast: normal by inspection . No dimpling, discharge, masses, tenderness or discharge .  ABDOMEN: Bowel sounds normal nontender  No guard or rebound, no hepato splenomegal no  CVA tenderness.  Extremtities:  No clubbing cyanosis or edema, no acute joint swelling or redness no focal atrophy sense appears in tact  NEURO:  Oriented x3, cranial nerves 3-12 appear to be intact, no obvious focal weakness,gait within normal limits no abnormal reflexes or asymmetrical SKIN: No acute rashes normal turgor, color, no bruising or petechiae. PSYCH: Oriented, good eye contact, no obvious depression anxiety, cognition and judgment appear normal. LN: no cervical axillary inguinal adenopathy No noted deficits in memory, attention, and speech.  ASSESSMENT AND PLAN:  Discussed the following assessment and plan:  Medicare annual wellness visit, subsequent - Plan: Basic metabolic panel, CBC with Differential/Platelet, Hepatic function panel, Lipid panel, TSH, POCT urinalysis dipstick  Burning sensation of feet - ball sof feet more at  night  - Plan: Basic metabolic panel, CBC with Differential/Platelet, Hepatic function panel, Lipid panel, TSH, Vitamin B12, Ferritin, IBC panel, Sedimentation rate  Insomnia - says has tried everything will try 10 of belsoma #10 call for inc dose if needed risk benefir   Hearing aid worn  Crohn's disease of both small and large intestine without complication - Plan: Basic metabolic panel, CBC with Differential/Platelet, Hepatic function panel, Lipid panel, TSH, Vitamin B12, Ferritin, IBC panel  Hyperlipidemia - Plan: Basic metabolic panel, CBC with Differential/Platelet, Hepatic function panel, Lipid panel, TSH  Rosacea - refill med  Need labs    b12  magnesium , cpx labs  Disc high risk medicine  At this time Patient Care Team: Burnis Medin, MD as PCP - General Pedro Earls, MD (Family Medicine) Amy Martinique, MD (Dermatology) Megan Salon, MD (Gynecology) Orie Rout, MD as Referring Physician (Specialist) Marygrace Drought, MD (Ophthalmology) Jerrell Belfast, MD as Consulting Physician (Otolaryngology)  Patient Instructions  Will  notify you  of labs when available.  Healthy lifestyle includes : At least 150 minutes of exercise weeks  , weight at healthy levels,  which is usually   BMI 19-25. Avoid trans fats and processed foods;  Increase fresh fruits and veges to 5 servings per day. And avoid sweet beverages including tea and juice. Mediterranean diet with olive oil and nuts have been noted to be heart and brain healthy . Avoid tobacco products . Limit  alcohol to  7 per week for women and 14 servings for men.  Get adequate sleep . Wear seat belts . Don't text and drive .   Sleep meds have risky side effects with next day  Diving effects  If use ambien  Limit to no more than 10 -14 per month at the 5 mg dose   Consider belsomra  Also  Have to take for a week to decide if helps or not.     Bridget Reyes. Bridget Reyes M.D.

## 2014-11-15 NOTE — Patient Instructions (Signed)
Will notify you  of labs when available.  Healthy lifestyle includes : At least 150 minutes of exercise weeks  , weight at healthy levels, which is usually   BMI 19-25. Avoid trans fats and processed foods;  Increase fresh fruits and veges to 5 servings per day. And avoid sweet beverages including tea and juice. Mediterranean diet with olive oil and nuts have been noted to be heart and brain healthy . Avoid tobacco products . Limit  alcohol to  7 per week for women and 14 servings for men.  Get adequate sleep . Wear seat belts . Don't text and drive .   Sleep meds have risky side effects with next day  Diving effects  If use ambien  Limit to no more than 10 -14 per month at the 5 mg dose   Consider belsomra  Also  Have to take for a week to decide if helps or not.

## 2014-12-09 DIAGNOSIS — Z85828 Personal history of other malignant neoplasm of skin: Secondary | ICD-10-CM | POA: Diagnosis not present

## 2014-12-09 DIAGNOSIS — D2272 Melanocytic nevi of left lower limb, including hip: Secondary | ICD-10-CM | POA: Diagnosis not present

## 2014-12-09 DIAGNOSIS — L821 Other seborrheic keratosis: Secondary | ICD-10-CM | POA: Diagnosis not present

## 2014-12-09 DIAGNOSIS — D2271 Melanocytic nevi of right lower limb, including hip: Secondary | ICD-10-CM | POA: Diagnosis not present

## 2014-12-09 DIAGNOSIS — L218 Other seborrheic dermatitis: Secondary | ICD-10-CM | POA: Diagnosis not present

## 2014-12-09 DIAGNOSIS — D1801 Hemangioma of skin and subcutaneous tissue: Secondary | ICD-10-CM | POA: Diagnosis not present

## 2014-12-09 DIAGNOSIS — D2261 Melanocytic nevi of right upper limb, including shoulder: Secondary | ICD-10-CM | POA: Diagnosis not present

## 2014-12-28 ENCOUNTER — Other Ambulatory Visit: Payer: Self-pay | Admitting: Internal Medicine

## 2014-12-30 NOTE — Telephone Encounter (Signed)
Filled for 1 year on 11/15/14

## 2015-02-03 DIAGNOSIS — Z23 Encounter for immunization: Secondary | ICD-10-CM | POA: Diagnosis not present

## 2015-03-20 ENCOUNTER — Telehealth: Payer: Self-pay | Admitting: Internal Medicine

## 2015-03-20 NOTE — Telephone Encounter (Signed)
Left message for pt to call back  °

## 2015-03-21 MED ORDER — RANITIDINE HCL 150 MG PO TABS: 150.0000 mg | ORAL_TABLET | Freq: Two times a day (BID) | ORAL | Status: DC

## 2015-03-21 NOTE — Telephone Encounter (Signed)
Options would be increasing omeprazole, though she previously had concerns about potential side effects, specifically bone health-related Is she still taking daily omeprazole? Options includes ranitidine 150 twice a day in addition to omeprazole 20 g daily Call if not improving

## 2015-03-21 NOTE — Telephone Encounter (Signed)
Pt states she has been having problems with a tickle in her throat, cough, and clearing her throat a lot. Pt states these are the same symptoms she had in the past. Pt states Dr. Hilarie Fredrickson mentioned BID H2blocker as a possibility for her and she is interested in this. Please advise.

## 2015-03-21 NOTE — Telephone Encounter (Signed)
Pt states she has been taking the omeprazole. Pt aware and will add the zantac. Script sent to pharmacy.

## 2015-05-22 DIAGNOSIS — H524 Presbyopia: Secondary | ICD-10-CM | POA: Diagnosis not present

## 2015-05-22 DIAGNOSIS — H52223 Regular astigmatism, bilateral: Secondary | ICD-10-CM | POA: Diagnosis not present

## 2015-05-22 DIAGNOSIS — H5203 Hypermetropia, bilateral: Secondary | ICD-10-CM | POA: Diagnosis not present

## 2015-05-22 DIAGNOSIS — H25813 Combined forms of age-related cataract, bilateral: Secondary | ICD-10-CM | POA: Diagnosis not present

## 2015-05-23 ENCOUNTER — Other Ambulatory Visit: Payer: Self-pay | Admitting: Internal Medicine

## 2015-06-02 ENCOUNTER — Encounter: Payer: Self-pay | Admitting: *Deleted

## 2015-06-11 ENCOUNTER — Ambulatory Visit: Payer: Self-pay | Admitting: Internal Medicine

## 2015-07-07 ENCOUNTER — Other Ambulatory Visit (INDEPENDENT_AMBULATORY_CARE_PROVIDER_SITE_OTHER): Payer: Medicare Other

## 2015-07-07 ENCOUNTER — Ambulatory Visit (INDEPENDENT_AMBULATORY_CARE_PROVIDER_SITE_OTHER): Payer: Medicare Other | Admitting: Internal Medicine

## 2015-07-07 ENCOUNTER — Encounter: Payer: Self-pay | Admitting: Internal Medicine

## 2015-07-07 VITALS — BP 130/60 | HR 64 | Ht 65.5 in | Wt 148.6 lb

## 2015-07-07 DIAGNOSIS — K219 Gastro-esophageal reflux disease without esophagitis: Secondary | ICD-10-CM

## 2015-07-07 DIAGNOSIS — K5000 Crohn's disease of small intestine without complications: Secondary | ICD-10-CM | POA: Diagnosis not present

## 2015-07-07 DIAGNOSIS — K50919 Crohn's disease, unspecified, with unspecified complications: Secondary | ICD-10-CM

## 2015-07-07 DIAGNOSIS — K5090 Crohn's disease, unspecified, without complications: Secondary | ICD-10-CM | POA: Diagnosis not present

## 2015-07-07 DIAGNOSIS — J387 Other diseases of larynx: Secondary | ICD-10-CM

## 2015-07-07 LAB — COMPREHENSIVE METABOLIC PANEL
ALBUMIN: 4.7 g/dL (ref 3.5–5.2)
ALK PHOS: 79 U/L (ref 39–117)
ALT: 19 U/L (ref 0–35)
AST: 19 U/L (ref 0–37)
BILIRUBIN TOTAL: 0.4 mg/dL (ref 0.2–1.2)
BUN: 19 mg/dL (ref 6–23)
CALCIUM: 9.8 mg/dL (ref 8.4–10.5)
CO2: 30 mEq/L (ref 19–32)
Chloride: 99 mEq/L (ref 96–112)
Creatinine, Ser: 0.84 mg/dL (ref 0.40–1.20)
GFR: 71.19 mL/min (ref >60.00)
GLUCOSE: 95 mg/dL (ref 70–99)
POTASSIUM: 4.1 meq/L (ref 3.5–5.1)
Sodium: 136 mEq/L (ref 135–145)
TOTAL PROTEIN: 7.7 g/dL (ref 6.0–8.3)

## 2015-07-07 LAB — CBC WITH DIFFERENTIAL/PLATELET
Basophils Absolute: 0 10*3/uL (ref 0.0–0.1)
Basophils Relative: 0.4 % (ref 0.0–3.0)
EOS PCT: 1.8 % (ref 0.0–5.0)
Eosinophils Absolute: 0.1 10*3/uL (ref 0.0–0.7)
HEMATOCRIT: 42.1 % (ref 36.0–46.0)
Hemoglobin: 14.4 g/dL (ref 12.0–15.0)
LYMPHS ABS: 1.6 10*3/uL (ref 0.7–4.0)
LYMPHS PCT: 21.2 % (ref 12.0–46.0)
MCHC: 34.3 g/dL (ref 30.0–36.0)
MCV: 92.3 fl (ref 78.0–100.0)
MONOS PCT: 7.8 % (ref 3.0–12.0)
Monocytes Absolute: 0.6 10*3/uL (ref 0.1–1.0)
NEUTROS ABS: 5.1 10*3/uL (ref 1.4–7.7)
NEUTROS PCT: 68.8 % (ref 43.0–77.0)
Platelets: 235 10*3/uL (ref 150.0–400.0)
RBC: 4.56 Mil/uL (ref 3.87–5.11)
RDW: 12.7 % (ref 11.5–15.5)
WBC: 7.4 10*3/uL (ref 4.0–10.5)

## 2015-07-07 MED ORDER — NA SULFATE-K SULFATE-MG SULF 17.5-3.13-1.6 GM/177ML PO SOLN: ORAL | Status: DC

## 2015-07-07 MED ORDER — MESALAMINE ER 500 MG PO CPCR: ORAL_CAPSULE | ORAL | Status: DC

## 2015-07-07 MED ORDER — RANITIDINE HCL 150 MG PO TABS: 150.0000 mg | ORAL_TABLET | Freq: Every day | ORAL | Status: DC

## 2015-07-07 MED ORDER — PANTOPRAZOLE SODIUM 40 MG PO TBEC: 40.0000 mg | DELAYED_RELEASE_TABLET | Freq: Every day | ORAL | Status: DC

## 2015-07-07 NOTE — Progress Notes (Signed)
Subjective:    Patient ID: Bridget Reyes, female    DOB: 10-Sep-1945, 70 y.o.   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 She was last seen one year ago. She has had Crohn's disease for nearly 20 years dating back to at least 33. Last exam was May 2012 which revealed aphthous erosions in the distal 10 cm of terminal ileum which were biopsied. Dr. Sharlett Iles remarked on scattered erosions and focal colitis scattered throughout the colon but I do not see that this was biopsied.  She reports that she has been feeling fairly well. She's been having more issues with what she considers to be her reflux symptoms. This is throat clearing and hoarseness. This was present at the time of diagnosis which he had direct laryngoscopy by ENT. Prior EGD by Dr. Sharlett Iles in 2010 showed a hiatal hernia but no evidence of Barrett's esophagus. She has not had traditional heartburn. No dysphagia or odynophagia. No nausea or vomiting. She'll we reduced omeprazole from 40 mg to 20 mg daily and she did well for 6 months. Then in October when the hoarseness and throat clearing started we added Zantac 150 mg twice a day. This is not really helped with throat clearing or hoarseness. When the ranitidine ran out recently she went back to omeprazole 40 mg with still no change in throat symptoms. She denies globus sensation. Occasionally she will have right lower quadrant tenderness which she says is "not a big deal". Bowel movements a been regular without blood or melena. She continues Pentasa 1.5 g twice a day. She requests hepatitis C screening because she can't recall from her insurance, that this was indicated based on her age. We reviewed risk factors she has never used intravenous or intranasal cocaine. No prior blood transfusion. No tattoo though she does have  permanent eyeliner which she feels was placed like a tattoo about 6 years ago.  Review of Systems As per history of present illness, otherwise negative  Current Medications, Allergies, Past Medical History, Past Surgical History, Family History and Social History were reviewed in [employer] record.     Objective:   Physical Exam BP 130/60 mmHg  Pulse 64  Ht 5' 5.5" (1.664 m)  Wt 148 lb 9.6 oz (67.405 kg)  BMI 24.34 kg/m2  LMP 04/19/1992 Constitutional: Well-developed and well-nourished. No distress. HEENT: Normocephalic and atraumatic. Oropharynx is clear and moist. No oropharyngeal exudate. Conjunctivae are normal.  No scleral icterus. Neck: Neck supple. Trachea midline. Cardiovascular: Normal rate, regular rhythm and intact distal pulses. No M/R/G Pulmonary/chest: Effort normal and breath sounds normal. No wheezing, rales or rhonchi. Abdominal: Soft, nontender, nondistended. Bowel sounds active throughout. There are no masses palpable. No hepatosplenomegaly. Extremities: no clubbing, cyanosis, or edema Lymphadenopathy: No cervical adenopathy noted. Neurological: Alert and oriented to person place and time. Skin: Skin is warm and dry. No rashes noted. Psychiatric: Normal mood and affect. Behavior is normal.     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  1. GERD/LPR -- She is having issues with mild hoarseness and throat clearing which may represent silent reflux with more LPR symptoms. This is despite twice a day ranitidine and omeprazole 20 mg. Recently increased omeprazole  to 40 mg without improvement. There are no alarm symptoms and no history of Barrett's esophagus. I do not feel upper endoscopy is indicated at present. Going to try her on pantoprazole 40 mg daily in place of omeprazole to see if this is more  effective for symptoms. If not she can add back ranitidine at night 150 mg to see if this helps. Further recommendations after this trial of new medication.  2. Ileal Crohn's disease with possible colonic involvement -- no major flares of her ileitis clinically in quite some time. She has been maintained on Pentasa 1.5 mg twice daily for some time. I recommended repeat colonoscopy for surveillance at this time. If she is found to have colonic involvement she would likely need surveillance every 2 years based on guidelines. Continue Pentasa 1.5 g twice a day. Further recommendations after colonoscopy.  3. Hep C screening -- check hepatitis C antibody.  Also check CBC and CMP  25 minutes spent with the patient today. Greater than 50% was spent in counseling and coordination of care with the patient 1 year ROV

## 2015-07-07 NOTE — Patient Instructions (Addendum)
You have been scheduled for a colonoscopy. Please follow written instructions given to you at your visit today.  Please pick up your prep supplies at the pharmacy within the next 1-3 days. If you use inhalers (even only as needed), please bring them with you on the day of your procedure. Your physician has requested that you go to www.startemmi.com and enter the access code given to you at your visit today. This web site gives a general overview about your procedure. However, you should still follow specific instructions given to you by our office regarding your preparation for the procedure.  We have sent the following medications to your pharmacy for you to pick up at your convenience: Pantoprazole 40 mg daily (in place of omeprazole) Ranitidine 150 every evening (if symptoms do not improve on pantoprazole)  Please discontinue omeprazole.  Take ranitidine only if pantoprazole does not work. A script is on hold at your pharmacy.  Continue Pentasa 1.5 grams twice daily.  Your physician has requested that you go to the basement for the following lab work before leaving today: CMP, CBC, Hep C antibody

## 2015-07-08 LAB — HEPATITIS C ANTIBODY: HCV Ab: NEGATIVE

## 2015-07-09 ENCOUNTER — Other Ambulatory Visit: Payer: Self-pay

## 2015-07-09 MED ORDER — PANTOPRAZOLE SODIUM 40 MG PO TBEC: 40.0000 mg | DELAYED_RELEASE_TABLET | Freq: Every day | ORAL | Status: DC

## 2015-07-17 ENCOUNTER — Telehealth: Payer: Self-pay | Admitting: *Deleted

## 2015-07-17 MED ORDER — PANTOPRAZOLE SODIUM 40 MG PO TBEC: 40.0000 mg | DELAYED_RELEASE_TABLET | Freq: Every day | ORAL | Status: DC

## 2015-07-17 MED ORDER — MESALAMINE ER 500 MG PO CPCR: ORAL_CAPSULE | ORAL | Status: DC

## 2015-07-17 NOTE — Telephone Encounter (Signed)
Rx sent 

## 2015-08-18 ENCOUNTER — Telehealth: Payer: Self-pay | Admitting: Internal Medicine

## 2015-08-18 ENCOUNTER — Ambulatory Visit (AMBULATORY_SURGERY_CENTER): Payer: Medicare Other | Admitting: Internal Medicine

## 2015-08-18 ENCOUNTER — Encounter: Payer: Self-pay | Admitting: Internal Medicine

## 2015-08-18 VITALS — BP 130/56 | HR 70 | Temp 98.9°F | Resp 9 | Ht 65.5 in | Wt 148.0 lb

## 2015-08-18 DIAGNOSIS — D122 Benign neoplasm of ascending colon: Secondary | ICD-10-CM | POA: Diagnosis not present

## 2015-08-18 DIAGNOSIS — Z1211 Encounter for screening for malignant neoplasm of colon: Secondary | ICD-10-CM

## 2015-08-18 DIAGNOSIS — K5090 Crohn's disease, unspecified, without complications: Secondary | ICD-10-CM | POA: Diagnosis not present

## 2015-08-18 DIAGNOSIS — K5000 Crohn's disease of small intestine without complications: Secondary | ICD-10-CM | POA: Diagnosis not present

## 2015-08-18 HISTORY — PX: COLONOSCOPY: SHX174

## 2015-08-18 MED ORDER — SODIUM CHLORIDE 0.9 % IV SOLN: 500.0000 mL | INTRAVENOUS | Status: DC

## 2015-08-18 NOTE — Patient Instructions (Signed)
YOU HAD AN ENDOSCOPIC PROCEDURE TODAY AT [facility]:   Refer to the procedure report that was given to you for any specific questions about what was found during the examination.  If the procedure report does not answer your questions, please call your gastroenterologist to clarify.  If you requested that your care partner not be given the details of your procedure findings, then the procedure report has been included in a sealed envelope for you to review at your convenience later.  YOU SHOULD EXPECT: Some feelings of bloating in the abdomen. Passage of more gas than usual.  Walking can help get rid of the air that was put into your GI tract during the procedure and reduce the bloating. If you had a lower endoscopy (such as a colonoscopy or flexible sigmoidoscopy) you may notice spotting of blood in your stool or on the toilet paper. If you underwent a bowel prep for your procedure, you may not have a normal bowel movement for a few days.  Please Note:  You might notice some irritation and congestion in your nose or some drainage.  This is from the oxygen used during your procedure.  There is no need for concern and it should clear up in a day or so.  SYMPTOMS TO REPORT IMMEDIATELY:   Following lower endoscopy (colonoscopy or flexible sigmoidoscopy):  Excessive amounts of blood in the stool  Significant tenderness or worsening of abdominal pains  Swelling of the abdomen that is new, acute  Fever of 100F or higher   For urgent or emergent issues, a gastroenterologist can be reached at any hour by calling [phone].   DIET: Your first meal following the procedure should be a small meal and then it is ok to progress to your normal diet. Heavy or fried foods are harder to digest and may make you feel nauseous or bloated.  Likewise, meals heavy in dairy and vegetables can increase bloating.  Drink plenty of fluids but you should avoid alcoholic beverages for 24  hours.  ACTIVITY:  You should plan to take it easy for the rest of today and you should NOT DRIVE or use heavy machinery until tomorrow (because of the sedation medicines used during the test).    FOLLOW UP: Our staff will call the number listed on your records the next business day following your procedure to check on you and address any questions or concerns that you may have regarding the information given to you following your procedure. If we do not reach you, we will leave a message.  However, if you are feeling well and you are not experiencing any problems, there is no need to return our call.  We will assume that you have returned to your regular daily activities without incident.  If any biopsies were taken you will be contacted by phone or by letter within the next 1-3 weeks.  Please call us at [phone] if you have not heard about the biopsies in 3 weeks.    SIGNATURES/CONFIDENTIALITY: You and/or your care partner have signed paperwork which will be entered into your electronic medical record.  These signatures attest to the fact that that the information above on your After Visit Summary has been reviewed and is understood.  Full responsibility of the confidentiality of this discharge information lies with you and/or your care-partner.  Polyp-handout given  Repeat colonoscopy will be determined by pathology.

## 2015-08-18 NOTE — Progress Notes (Signed)
Called to room to assist during endoscopic procedure.  Patient ID and intended procedure confirmed with present staff. Received instructions for my participation in the procedure from the performing physician.  

## 2015-08-18 NOTE — Progress Notes (Signed)
A/ox3, pleased with MAC, report to RN 

## 2015-08-18 NOTE — Op Note (Signed)
[facility] Patient Name: Bridget Reyes Procedure Date: 08/18/2015 1:18 PM MRN: [id number] Endoscopist: Jerene Bears , MD Age: 70 Date of Birth: 26-Feb-1946 Gender: Female Procedure:                Colonoscopy Indications:              High risk colon cancer surveillance: Crohn's                            disease small intestine, question of colonic                            involvement; most recently in clinical remission,                            last colonoscopy May 2012 Medicines:                Monitored Anesthesia Care Procedure:                Pre-Anesthesia Assessment:                           - Prior to the procedure, a History and Physical                            was performed, and patient medications and                            allergies were reviewed. The patient's tolerance of                            previous anesthesia was also reviewed. The risks                            and benefits of the procedure and the sedation                            options and risks were discussed with the patient.                            All questions were answered, and informed consent                            was obtained. Prior Anticoagulants: The patient has                            taken no previous anticoagulant or antiplatelet                            agents. ASA Grade Assessment: II - A patient with                            mild systemic disease. After reviewing the risks  and benefits, the patient was deemed in                            satisfactory condition to undergo the procedure.                           After obtaining informed consent, the colonoscope                            was passed under direct vision. Throughout the                            procedure, the patient's blood pressure, pulse, and                            oxygen saturations were monitored continuously. The                            Model  [id number]-[id number]) scope was introduced                            through the anus and advanced to the the terminal                            ileum. The colonoscopy was performed without                            difficulty. The patient tolerated the procedure                            well. The quality of the bowel preparation was                            good. The terminal ileum, ileocecal valve,                            appendiceal orifice, and rectum were photographed. Scope In: 1:40:40 PM Scope Out: 2:09:03 PM Scope Withdrawal Time: 0 hours 23 minutes 34 seconds  Total Procedure Duration: 0 hours 28 minutes 23 seconds  Findings:                 The digital rectal exam was normal.                           The terminal ileum appeared normal (10-15 cm).                           A 10 mm polyp was found in the ascending colon. The                            polyp was flat. The polyp was removed with a cold                            snare. Resection and retrieval were  complete.                           The exam was otherwise without abnormality on                            direct and retroflexion views. No evidence of                            colitis. Complications:            No immediate complications. Estimated Blood Loss:     Estimated blood loss was minimal. Impression:               - The examined portion of the ileum was normal.                           - One 10 mm polyp in the ascending colon, removed                            with a cold snare. Resected and retrieved.                           - The examination was otherwise normal on direct                            and retroflexion views. Recommendation:           - Patient has a contact number available for                            emergencies. The signs and symptoms of potential                            delayed complications were discussed with the                            patient. Return to  normal activities tomorrow.                            Written discharge instructions were provided to the                            patient.                           - Resume previous diet.                           - Continue present medications.                           - Repeat colonoscopy in 3 years for surveillance. Jerene Bears, MD 08/18/2015 2:15:21 PM This report has been signed electronically.

## 2015-08-18 NOTE — Telephone Encounter (Signed)
Returned patient's call and she states that she threw up the second prep this morning.  I asked her what her stools looked like and she stated that it was a greenish color with some flakes.  I advised her to come in as scheduled for her procedure and we will reevaluate her stools and if needed, we can give her an enema.  All questions were answered.

## 2015-08-19 ENCOUNTER — Telehealth: Payer: Self-pay

## 2015-08-19 NOTE — Telephone Encounter (Signed)
  Follow up Call-  Call back number 08/18/2015  Post procedure Call Back phone  # [phone]  Permission to leave phone message Yes     Patient questions:  Do you have a fever, pain , or abdominal swelling? No. Pain Score  0 *  Have you tolerated food without any problems? Yes.    Have you been able to return to your normal activities? Yes.    Do you have any questions about your discharge instructions: Diet   No. Medications  No. Follow up visit  No.  Do you have questions or concerns about your Care? No.  Actions: * If pain score is 4 or above: No action needed, pain <4.

## 2015-08-25 ENCOUNTER — Encounter: Payer: Self-pay | Admitting: Internal Medicine

## 2015-08-26 DIAGNOSIS — G43019 Migraine without aura, intractable, without status migrainosus: Secondary | ICD-10-CM | POA: Diagnosis not present

## 2015-08-27 ENCOUNTER — Encounter: Payer: Self-pay | Admitting: Family Medicine

## 2015-09-16 ENCOUNTER — Ambulatory Visit (INDEPENDENT_AMBULATORY_CARE_PROVIDER_SITE_OTHER): Payer: Medicare Other | Admitting: Obstetrics & Gynecology

## 2015-09-16 ENCOUNTER — Encounter: Payer: Self-pay | Admitting: Obstetrics & Gynecology

## 2015-09-16 VITALS — BP 130/60 | HR 70 | Resp 16 | Ht 65.25 in | Wt 145.0 lb

## 2015-09-16 DIAGNOSIS — Z0000 Encounter for general adult medical examination without abnormal findings: Secondary | ICD-10-CM

## 2015-09-16 DIAGNOSIS — Z01419 Encounter for gynecological examination (general) (routine) without abnormal findings: Secondary | ICD-10-CM | POA: Diagnosis not present

## 2015-09-16 DIAGNOSIS — Z124 Encounter for screening for malignant neoplasm of cervix: Secondary | ICD-10-CM

## 2015-09-16 LAB — POCT URINALYSIS DIPSTICK
BILIRUBIN UA: NEGATIVE
GLUCOSE UA: NEGATIVE
Ketones, UA: NEGATIVE
LEUKOCYTES UA: NEGATIVE
NITRITE UA: NEGATIVE
PH UA: 5
Protein, UA: NEGATIVE
RBC UA: NEGATIVE
UROBILINOGEN UA: NEGATIVE

## 2015-09-16 MED ORDER — ESTRADIOL 10 MCG VA TABS: 1.0000 | ORAL_TABLET | VAGINAL | Status: DC

## 2015-09-16 NOTE — Progress Notes (Addendum)
70 y.o. G2P2 MarriedCaucasianF here for annual exam.  Doing well.  Just had colonoscopy May 1st with Dr. Hilarie Fredrickson.  Had a polyp.  Needs follow up 3 years.  PCP:  Dr. Regis Bill.  Last appt was July, 2016.  Blood work was all normal.    Patient's last menstrual period was 04/19/1992.          Sexually active: Yes.    The current method of family planning is status post hysterectomy.    Exercising: Yes.    resistance machines, walking Smoker:  no  Health Maintenance: Pap:  07/09/13 Neg History of abnormal Pap:  no MMG:  11/05/14 BIRADS1:Neg Colonoscopy:  08/18/15 Polyps - repeat 3 years.  Dr Hilarie Fredrickson. BMD:   11/05/14 Normal  TDaP:  10/2013  Pneumonia vaccine(s):  10/2013 Zostavax:   10/2009 Hep C testing: 07/07/15   Screening Labs: PCP, Urine today: not done   reports that she has never smoked. She has never used smokeless tobacco. She reports that she drinks about 0.5 - 1.0 oz of alcohol per week. She reports that she does not use illicit drugs.  Past Medical History  Diagnosis Date  . Hiatal hernia   . Esophageal reflux   . Migraines   . Hyperlipemia   . Crohn's   . Eczema   . Arthritis   . Cataract   . Rosacea   . Skin cancer of face     scca removed   . Ileitis   . Gastritis     Past Surgical History  Procedure Laterality Date  . Total vaginal hysterectomy  1994  . Tonsillectomy    . Breast biopsy Left 1980  . Colonoscopy  2012    Current Outpatient Prescriptions  Medication Sig Dispense Refill  . Azelaic Acid (FINACEA) 15 % cream After skin is thoroughly washed and patted dry, gently but thoroughly massage a thin film of azelaic acid cream into the affected area twice daily, in the morning and evening. 50 g 11  . cyclobenzaprine (FLEXERIL) 10 MG tablet TAKE 1/2 TABLET BY MOUTH TWICE DAILY AS NEEDED 30 tablet 0  . Estradiol (VAGIFEM) 10 MCG TABS vaginal tablet Place 1 tablet (10 mcg total) vaginally as directed. 24 tablet 4  . fluocinonide cream (LIDEX) [id number] % Apply 1  application topically daily as needed. Use as directed when needed for eczema 30 g 1  . fluticasone (FLONASE) 50 MCG/ACT nasal spray Place 2 sprays into the nose daily. 16 g 11  . ipratropium (ATROVENT) 0.06 % nasal spray USE 2 SPRAYS IN EACH NOSTRIL 2-3 TIMES A DAY 15 mL 11  . loratadine (CLARITIN) 10 MG tablet Take 10 mg by mouth daily.    . mesalamine (PENTASA) 500 MG CR capsule Take three tablet by mouth twice a day 540 capsule 3  . MULTIPLE VITAMIN PO Take 1 tablet by mouth daily.     . naproxen (NAPROSYN) 500 MG tablet take 1 tablet by mouth if needed for migraines may repeat after 4 hours  0  . pantoprazole (PROTONIX) 40 MG tablet Take 1 tablet (40 mg total) by mouth daily. 90 tablet 3  . ranitidine (ZANTAC) 150 MG tablet Take 150 mg by mouth at bedtime.    . simvastatin (ZOCOR) 40 MG tablet Take 1 tablet (40 mg total) by mouth at bedtime. 90 tablet 3   No current facility-administered medications for this visit.    Family History  Problem Relation Age of Onset  . Parkinsonism Mother   . Lung  cancer Mother   . Heart disease Father     died chf in 66 s  . Hypertension Father   . Breast cancer Cousin   . Congestive Heart Failure Father   . Colon cancer Neg Hx     ROS:  Pertinent items are noted in HPI.  Otherwise, a comprehensive ROS was negative.  Exam:   BP 130/60 mmHg  Pulse 70  Resp 16  Ht 5' 5.25" (1.657 m)  Wt 145 lb (65.772 kg)  BMI 23.95 kg/m2  LMP 04/19/1992   Height: 5' 5.25" (165.7 cm)  Ht Readings from Last 3 Encounters:  09/16/15 5' 5.25" (1.657 m)  08/18/15 5' 5.5" (1.664 m)  07/07/15 5' 5.5" (1.664 m)    General appearance: alert, cooperative and appears stated age Head: Normocephalic, without obvious abnormality, atraumatic Neck: no adenopathy, supple, symmetrical, trachea midline and thyroid normal to inspection and palpation Lungs: clear to auscultation bilaterally Breasts: normal appearance, no masses or tenderness, well healed scar on left  breast Heart: regular rate and rhythm Abdomen: soft, non-tender; bowel sounds normal; no masses,  no organomegaly Extremities: extremities normal, atraumatic, no cyanosis or edema Skin: Skin color, texture, turgor normal. No rashes or lesions Lymph nodes: Cervical, supraclavicular, and axillary nodes normal. No abnormal inguinal nodes palpated Neurologic: Grossly normal   Pelvic: External genitalia:  no lesions              Urethra:  normal appearing urethra with no masses, tenderness or lesions              Bartholins and Skenes: normal                 Vagina: normal appearing vagina with very small area where granulation tissue was location that is mildly erythematous, no raised, no bleeding with touch              Cervix: absent              Pap taken: No. Bimanual Exam:  Uterus:  uterus absent              Adnexa: no mass, fullness, tenderness               Rectovaginal: Confirms               Anus:  normal sphincter tone, no lesions  Chaperone was present for exam.  A:  Well Woman with normal exam PMP H/O TVH H/O vaginal ulceration/granulation tissue after Estring was "stuck" on vagina. Biopsy of this tissue has been negative.  Did three treatments with Josph Macho Touch in 2016.  Considering not doing "touch up" this year. Vaginal atrophic changes H/O Crohn's ileitis--followed by Dr. Hilarie Fredrickson   P: Mammogram yearly. pap smear neg 2015. No pap today.  Continue vagifem 6meq pv twice weekly. Rx to pharmacy. return annually or prn

## 2015-09-16 NOTE — Addendum Note (Signed)
Addended by: Megan Salon on: 09/16/2015 01:52 PM   Modules accepted: Miquel Dunn

## 2015-10-03 ENCOUNTER — Telehealth: Payer: Self-pay | Admitting: Internal Medicine

## 2015-10-03 NOTE — Telephone Encounter (Signed)
Pt states she is taking protonix 83m daily along with zantac 1544mBID and she is still having issues. Reports she is having epigastric pain and hoarseness and having to clear her throat more. Pt wants to know what she might try differently. Please advise.

## 2015-10-07 ENCOUNTER — Encounter: Payer: Self-pay | Admitting: Internal Medicine

## 2015-10-07 MED ORDER — PANTOPRAZOLE SODIUM 40 MG PO TBEC: 40.0000 mg | DELAYED_RELEASE_TABLET | Freq: Two times a day (BID) | ORAL | Status: DC

## 2015-10-07 NOTE — Telephone Encounter (Signed)
Pt aware, script sent to the pharmacy. Pt states she will call back to schedule the EGD once she can check her schedule.

## 2015-10-07 NOTE — Telephone Encounter (Signed)
Given epigastric pain despite BID H2 blocker and daily PPI, I would recommend we proceed to EGD In the interim she can try pantoprazole 40 mg BID-AC.  Zantac 150 q12h PRN, but likely will not need while on BID PPI GERD diet and reflux precautions

## 2015-10-23 ENCOUNTER — Other Ambulatory Visit: Payer: Self-pay | Admitting: Obstetrics & Gynecology

## 2015-10-23 DIAGNOSIS — Z1231 Encounter for screening mammogram for malignant neoplasm of breast: Secondary | ICD-10-CM

## 2015-10-24 ENCOUNTER — Ambulatory Visit (AMBULATORY_SURGERY_CENTER): Payer: Self-pay | Admitting: *Deleted

## 2015-10-24 VITALS — Ht 65.0 in | Wt 146.0 lb

## 2015-10-24 DIAGNOSIS — K2100 Gastro-esophageal reflux disease with esophagitis, without bleeding: Secondary | ICD-10-CM

## 2015-10-24 NOTE — Progress Notes (Signed)
Patient denies any allergies to egg or soy products. Patient denies complications with anesthesia/sedation.  Patient denies oxygen use at home and denies diet medications. Emmi instructions for colonoscopy explained but patient denied.  Pamphlet given.  

## 2015-11-06 ENCOUNTER — Ambulatory Visit (AMBULATORY_SURGERY_CENTER): Payer: Medicare Other | Admitting: Internal Medicine

## 2015-11-06 ENCOUNTER — Encounter: Payer: Self-pay | Admitting: Internal Medicine

## 2015-11-06 VITALS — BP 134/60 | HR 61 | Temp 98.4°F | Resp 15 | Ht 65.0 in | Wt 146.0 lb

## 2015-11-06 DIAGNOSIS — R0789 Other chest pain: Secondary | ICD-10-CM | POA: Diagnosis present

## 2015-11-06 DIAGNOSIS — J387 Other diseases of larynx: Secondary | ICD-10-CM

## 2015-11-06 DIAGNOSIS — K219 Gastro-esophageal reflux disease without esophagitis: Secondary | ICD-10-CM | POA: Diagnosis not present

## 2015-11-06 DIAGNOSIS — K2950 Unspecified chronic gastritis without bleeding: Secondary | ICD-10-CM | POA: Diagnosis not present

## 2015-11-06 DIAGNOSIS — K5090 Crohn's disease, unspecified, without complications: Secondary | ICD-10-CM | POA: Diagnosis not present

## 2015-11-06 DIAGNOSIS — K449 Diaphragmatic hernia without obstruction or gangrene: Secondary | ICD-10-CM | POA: Diagnosis not present

## 2015-11-06 MED ORDER — SODIUM CHLORIDE 0.9 % IV SOLN: 500.0000 mL | INTRAVENOUS | Status: DC

## 2015-11-06 MED ORDER — OMEPRAZOLE 40 MG PO CPDR: 40.0000 mg | DELAYED_RELEASE_CAPSULE | Freq: Every day | ORAL | Status: DC

## 2015-11-06 NOTE — Progress Notes (Signed)
Called to room to assist during endoscopic procedure.  Patient ID and intended procedure confirmed with present staff. Received instructions for my participation in the procedure from the performing physician.  

## 2015-11-06 NOTE — Op Note (Signed)
[place] Patient Name: Bridget Reyes Procedure Date: 11/06/2015 9:50 AM MRN: [id number] Endoscopist: Jerene Bears , MD Age: 70 Referring MD:  Date of Birth: 01-Oct-1945 Gender: Female Account #: [id number] Procedure:                Upper GI endoscopy Indications:              Gastro-esophageal reflux disease, Chest pain (non                            cardiac), Sore throat, Hoarseness Medicines:                Monitored Anesthesia Care Procedure:                Pre-Anesthesia Assessment:                           - Prior to the procedure, a History and Physical                            was performed, and patient medications and                            allergies were reviewed. The patient's tolerance of                            previous anesthesia was also reviewed. The risks                            and benefits of the procedure and the sedation                            options and risks were discussed with the patient.                            All questions were answered, and informed consent                            was obtained. Prior Anticoagulants: The patient has                            taken no previous anticoagulant or antiplatelet                            agents. ASA Grade Assessment: II - A patient with                            mild systemic disease. After reviewing the risks                            and benefits, the patient was deemed in                            satisfactory condition to undergo the procedure.  After obtaining informed consent, the endoscope was                            passed under direct vision. Throughout the                            procedure, the patient's blood pressure, pulse, and                            oxygen saturations were monitored continuously. The                            Model GIF-HQ190 [id number]) scope was introduced                            through the mouth, and  advanced to the second part                            of duodenum. The upper GI endoscopy was                            accomplished without difficulty. The patient                            tolerated the procedure well. Scope In: Scope Out: Findings:                 The examined esophagus was normal. Multiple                            biopsies were obtained with cold forceps for                            evaluation of eosinophilic esophagitis randomly in                            the mid esophagus and in the distal esophagus.                           The entire examined stomach was normal. Biopsies                            were taken with a cold forceps for histology and                            Helicobacter pylori testing.                           The cardia and gastric fundus were normal on                            retroflexion.                           The examined duodenum was normal. Complications:  No immediate complications. Estimated Blood Loss:     Estimated blood loss was minimal. Impression:               - Normal esophagus. Biopsied.                           - Normal stomach. Biopsied.                           - Normal examined duodenum. Recommendation:           - Patient has a contact number available for                            emergencies. The signs and symptoms of potential                            delayed complications were discussed with the                            patient. Return to normal activities tomorrow.                            Written discharge instructions were provided to the                            patient.                           - Resume regular diet.                           - Continue present medications.                           - Await pathology results.                           - If laryngeal and/or reflux symptoms persist on                            twice daily PPI I would recommend ENT referral and                             consideration of 24 hr pH study with impedence Jerene Bears, MD 11/06/2015 10:21:16 AM This report has been signed electronically.

## 2015-11-06 NOTE — Progress Notes (Signed)
Report to PACU, RN, vss, BBS= Clear.  

## 2015-11-06 NOTE — Patient Instructions (Signed)
YOU HAD AN ENDOSCOPIC PROCEDURE TODAY AT [facility]:   Refer to the procedure report that was given to you for any specific questions about what was found during the examination.  If the procedure report does not answer your questions, please call your gastroenterologist to clarify.  If you requested that your care partner not be given the details of your procedure findings, then the procedure report has been included in a sealed envelope for you to review at your convenience later.  YOU SHOULD EXPECT: Some feelings of bloating in the abdomen. Passage of more gas than usual.  Walking can help get rid of the air that was put into your GI tract during the procedure and reduce the bloating. If you had a lower endoscopy (such as a colonoscopy or flexible sigmoidoscopy) you may notice spotting of blood in your stool or on the toilet paper. If you underwent a bowel prep for your procedure, you may not have a normal bowel movement for a few days.  Please Note:  You might notice some irritation and congestion in your nose or some drainage.  This is from the oxygen used during your procedure.  There is no need for concern and it should clear up in a day or so.  SYMPTOMS TO REPORT IMMEDIATELY:   Following upper endoscopy (EGD)  Vomiting of blood or coffee ground material  New chest pain or pain under the shoulder blades  Painful or persistently difficult swallowing  New shortness of breath  Fever of 100F or higher  Black, tarry-looking stools  For urgent or emergent issues, a gastroenterologist can be reached at any hour by calling [phone].   DIET: Your first meal following the procedure should be a small meal and then it is ok to progress to your normal diet. Heavy or fried foods are harder to digest and may make you feel nauseous or bloated.  Likewise, meals heavy in dairy and vegetables can increase bloating.  Drink plenty of fluids but you should avoid alcoholic beverages for  24 hours.  ACTIVITY:  You should plan to take it easy for the rest of today and you should NOT DRIVE or use heavy machinery until tomorrow (because of the sedation medicines used during the test).    FOLLOW UP: Our staff will call the number listed on your records the next business day following your procedure to check on you and address any questions or concerns that you may have regarding the information given to you following your procedure. If we do not reach you, we will leave a message.  However, if you are feeling well and you are not experiencing any problems, there is no need to return our call.  We will assume that you have returned to your regular daily activities without incident.  If any biopsies were taken you will be contacted by phone or by letter within the next 1-3 weeks.  Please call us at [phone] if you have not heard about the biopsies in 3 weeks.    SIGNATURES/CONFIDENTIALITY: You and/or your care partner have signed paperwork which will be entered into your electronic medical record.  These signatures attest to the fact that that the information above on your After Visit Summary has been reviewed and is understood.  Full responsibility of the confidentiality of this discharge information lies with you and/or your care-partner.  Please read GERD handout provided.

## 2015-11-07 ENCOUNTER — Telehealth: Payer: Self-pay | Admitting: *Deleted

## 2015-11-07 NOTE — Telephone Encounter (Signed)
  Follow up Call-  Call back number 11/06/2015 08/18/2015  Post procedure Call Back phone  # [phone]  Permission to leave phone message Yes Yes     Patient questions:  Do you have a fever, pain , or abdominal swelling? No. Pain Score  0 *  Have you tolerated food without any problems? Yes.    Have you been able to return to your normal activities? Yes.    Do you have any questions about your discharge instructions: Diet   No. Medications  No. Follow up visit  No.  Do you have questions or concerns about your Care? No.  Actions: * If pain score is 4 or above: No action needed, pain <4.

## 2015-11-10 ENCOUNTER — Ambulatory Visit: Payer: Self-pay

## 2015-11-10 ENCOUNTER — Ambulatory Visit
Admission: RE | Admit: 2015-11-10 | Discharge: 2015-11-10 | Disposition: A | Discharge status: Home / Self Care | Payer: Medicare Other | Source: Ambulatory Visit | Attending: Obstetrics & Gynecology | Admitting: Obstetrics & Gynecology

## 2015-11-10 DIAGNOSIS — Z1231 Encounter for screening mammogram for malignant neoplasm of breast: Secondary | ICD-10-CM | POA: Diagnosis not present

## 2015-11-12 ENCOUNTER — Encounter: Payer: Self-pay | Admitting: Internal Medicine

## 2015-11-20 ENCOUNTER — Other Ambulatory Visit: Payer: Self-pay | Admitting: Internal Medicine

## 2015-11-20 NOTE — Telephone Encounter (Signed)
Refill

## 2015-11-21 NOTE — Telephone Encounter (Signed)
Sent to the pharmacy by e-scribe for 30 days.  Pt has upcoming appt on 11/25/15

## 2015-11-24 NOTE — Progress Notes (Signed)
Pre visit review using our clinic review tool, if applicable. No additional management support is needed unless otherwise documented below in the visit note.  Chief Complaint  Patient presents with  . Medicare Wellness    HPI: Bridget Reyes 70 y.o. comes in today for Preventive Medicare wellness visit . and med and disease management Headaches    Stopped imitrex and use naproxen as needed.    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 INFLUENZA VACCINE  11/18/2015  . MAMMOGRAM  11/09/2017  . COLONOSCOPY  08/18/2018  . TETANUS/TDAP  11/14/2023  . DEXA SCAN  Completed  . ZOSTAVAX  Completed  . Hepatitis C Screening  Completed  . PNA vac Low Risk Adult  Completed   Health Maintenance Review LIFESTYLE:  TAD no Sugar beverages: diet  Drinks  Sleep: about 5-6 hours .   MEDICARE DOCUMENT QUESTIONS  TO SCAN   Hearing:  Aids   Vision:  No limitations at present . Last eye check UTD  Safety:  Has smoke detector and wears seat belts.  No firearms. No excess sun exposure. Sees dentist regularly.  Falls: no  Advance directive :  Reviewed  Has one.  Memory: Felt to be good  , no concern from her or her family.  Depression: No anhedonia unusual crying or depressive symptoms  Nutrition: Eats well balanced diet; adequate calcium and vitamin D. No swallowing chewing problems.  Injury: no major injuries in the last six months.  Other healthcare providers:  Reviewed today .  Social:  Lives with spouse married. hh of 2  No pets.   Preventive parameters: up-to-date  Reviewed   ADLS:   There are no problems or need for assistance  driving, feeding, obtaining food, dressing, toileting and  bathing, managing money using phone. She is independent.    ROS:  GEN/ HEENT: No fever, significant weight changes sweats headaches vision problems hearing changes, CV/ PULM; No chest pain shortness of breath cough, syncope,edema  change in exercise tolerance. GI /GU: No adominal pain, vomiting, change in bowel habits. No blood in the stool. No significant GU symptoms. SKIN/HEME: ,no acute skin rashes suspicious lesions or bleeding. No lymphadenopathy, nodules, masses.  NEURO/ PSYCH:  No neurologic signs such as weakness numbness. No depression anxiety. IMM/ Allergy: No unusual infections.  Allergy .   REST of 12 system review negative except as per HPI   Past Medical History:  Diagnosis Date  . Allergy   . Arthritis    knees, neck - no meds  . Cataract    bilateral - MD just watchning  . Crohn's   . Eczema   . Esophageal reflux   . Gastritis   . Hiatal hernia   . Hyperlipemia   . Ileitis   . Migraines    tx with naproxen - last one 4 yrs ago  . Rosacea   . Skin cancer of face    scca removed     Family History  Problem Relation Age of Onset  . Parkinsonism Mother   . Lung cancer Mother   . Heart disease Father     died chf in 50 s  .  Hypertension Father   . Congestive Heart Failure Father   . Breast cancer Cousin   . Colon cancer Neg Hx   . Esophageal cancer Neg Hx   . Rectal cancer Neg Hx   . Stomach cancer Neg Hx     Social History   Social History  . Marital status: Married    Spouse name: N/A  . Number of children: 2  . Years of education: N/A   Occupational History  . retired      Education officer, museum  .  Unemployed   Social History Main Topics  . Smoking status: Never Smoker  . Smokeless tobacco: Never Used  . Alcohol use 1.8 - 2.4 oz/week    3 - 4 Glasses of wine per week     Comment: occ  . Drug use: No  . Sexual activity: Yes    Partners: Male    Birth control/ protection: Surgical     Comment: hysterectomy   Other Topics Concern  . None    Social History Narrative   hh  Of 2       Dog    Married    G2P2    Outpatient Encounter Prescriptions as of 11/25/2015  Medication Sig  . Azelaic Acid (FINACEA) 15 % cream After skin is thoroughly washed and patted dry, gently but thoroughly massage a thin film of azelaic acid cream into the affected area twice daily, in the morning and evening.  . cyclobenzaprine (FLEXERIL) 10 MG tablet TAKE 1/2 TABLET BY MOUTH TWICE DAILY AS NEEDED  . Estradiol (VAGIFEM) 10 MCG TABS vaginal tablet Place 1 tablet (10 mcg total) vaginally as directed.  . fluocinonide cream (LIDEX) 7.89 % Apply 1 application topically daily as needed. Use as directed when needed for eczema  . fluticasone (FLONASE) 50 MCG/ACT nasal spray Place 2 sprays into the nose daily.  Marland Kitchen ipratropium (ATROVENT) 0.06 % nasal spray instill 2 sprays into each nostril twice a day to three times a day  . loratadine (CLARITIN) 10 MG tablet Take 10 mg by mouth daily as needed. Reported on 10/24/2015  . mesalamine (PENTASA) 500 MG CR capsule Take three tablet by mouth twice a day  . MULTIPLE VITAMIN PO Take 1 tablet by mouth daily.   . naproxen (NAPROSYN) 500 MG tablet Reported on 10/24/2015  . omeprazole (PRILOSEC) 40 MG capsule Take 1 capsule (40 mg total) by mouth daily.  . simvastatin (ZOCOR) 40 MG tablet Take 1 tablet (40 mg total) by mouth at bedtime.  . valACYclovir (VALTREX) 1000 MG tablet Take 1 tablet (1,000 mg total) by mouth daily as needed.  . [DISCONTINUED] Azelaic Acid (FINACEA) 15 % cream After skin is thoroughly washed and patted dry, gently but thoroughly massage a thin film of azelaic acid cream into the affected area twice daily, in the morning and evening. (Patient taking differently: as needed. After skin is thoroughly washed and patted dry, gently but thoroughly massage a thin film of azelaic acid cream into the affected area twice daily, in the morning and evening.)  . [DISCONTINUED] cyclobenzaprine (FLEXERIL) 10 MG tablet  TAKE 1/2 TABLET BY MOUTH TWICE DAILY AS NEEDED  . [DISCONTINUED] ipratropium (ATROVENT) 0.06 % nasal spray instill 2 sprays into each nostril twice a day to three times a day  . [DISCONTINUED] simvastatin (ZOCOR) 40 MG tablet Take 1 tablet (40 mg total) by mouth at bedtime.  . [DISCONTINUED] valACYclovir (VALTREX) 1000 MG tablet Take 1,000 mg by mouth daily as needed.  . [  DISCONTINUED] ranitidine (ZANTAC) 150 MG tablet Take 150 mg by mouth at bedtime. Reported on 10/24/2015   No facility-administered encounter medications on file as of 11/25/2015.     EXAM:  BP 136/74 (BP Location: Right Arm, Patient Position: Sitting, Cuff Size: Normal)   Temp 97.9 F (36.6 C) (Oral)   Ht 5' 5.5" (1.664 m)   Wt 143 lb 6.4 oz (65 kg)   LMP 04/19/1992   BMI 23.50 kg/m   Body mass index is 23.5 kg/m.  Physical Exam: Vital signs reviewed BDZ:HGDJ is a well-developed well-nourished alert cooperative   who appears stated age in no acute distress.  HEENT: normocephalic atraumatic , Eyes: PERRL EOM's full, conjunctiva clear, Nares: paten,t no deformity discharge or tenderness., Ears: no deformity EAC's clear TMs with normal landmarks. Mouth: clear OP, no lesions, edema.  Moist mucous membranes. Dentition in adequate repair. NECK: supple without masses, thyromegaly or bruits. CHEST/PULM:  Clear to auscultation and percussion breath sounds equal no wheeze , rales or rhonchi. No chest wall deformities or tenderness. CV: PMI is nondisplaced, S1 S2 no gallops, murmurs, rubs. Peripheral pulses are full without delay.No JVD .  ABDOMEN: Bowel sounds normal nontender  No guard or rebound, no hepato splenomegal no CVA tenderness.   Extremtities:  No clubbing cyanosis or edema, no acute joint swelling or redness no focal atrophy NEURO:  Oriented x3, cranial nerves 3-12 appear to be intact, no obvious focal weakness,gait within normal limits no abnormal reflexes or asymmetrical SKIN: No acute rashes normal turgor, color,  no bruising or petechiae. PSYCH: Oriented, good eye contact, no obvious depression anxiety, cognition and judgment appear normal. LN: no cervical axillary inguinal adenopathy No noted deficits in memory, attention, and speech.   Lab Results  Component Value Date   WBC 7.4 07/07/2015   HGB 14.4 07/07/2015   HCT 42.1 07/07/2015   PLT 235.0 07/07/2015   GLUCOSE 95 07/07/2015   CHOL 142 11/15/2014   TRIG 95.0 11/15/2014   HDL 50.70 11/15/2014   LDLCALC 73 11/15/2014   ALT 19 07/07/2015   AST 19 07/07/2015   NA 136 07/07/2015   K 4.1 07/07/2015   CL 99 07/07/2015   CREATININE 0.84 07/07/2015   BUN 19 07/07/2015   CO2 30 07/07/2015   TSH 2.61 11/15/2014   Wt Readings from Last 3 Encounters:  11/25/15 143 lb 6.4 oz (65 kg)  11/06/15 146 lb (66.2 kg)  10/24/15 146 lb (66.2 kg)      ASSESSMENT AND PLAN:  Discussed the following assessment and plan:  Medicare annual wellness visit, subsequent  Crohn's disease without complication, unspecified gastrointestinal tract location ([place]) - Plan: Lipid panel, Hepatic function panel, Basic metabolic panel, CBC with Differential/Platelet, TSH, C-reactive protein  Hearing aid worn  Medication management - Plan: Lipid panel, Hepatic function panel, Basic metabolic panel, CBC with Differential/Platelet, TSH, C-reactive protein  Hyperlipidemia - Plan: Lipid panel, Hepatic function panel, Basic metabolic panel, CBC with Differential/Platelet, TSH, C-reactive protein  Rosacea - as needed med  for clearing about 2 days  - Plan: Lipid panel, Hepatic function panel, Basic metabolic panel, CBC with Differential/Platelet, TSH, C-reactive protein  Neck arthritis ([facility]) - using prn  flexeril  lasts a while - Plan: Lipid panel, Hepatic function panel, Basic metabolic panel, CBC with Differential/Platelet, TSH, C-reactive protein  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 Continue lifestyle intervention healthy eating and exercise .  Patient Care Team: Burnis Medin, MD as PCP - General Pedro Earls, MD (Family Medicine) Amy Martinique, MD (Dermatology) Megan Salon, MD (Gynecology) Orie Rout, MD as Referring Physician (Specialist) Marygrace Drought, MD (Ophthalmology) Jerrell Belfast, MD as Consulting Physician (Otolaryngology) Marica Otter, OD (Optometry)  Patient Instructions  Continue lifestyle intervention healthy eating and exercise . Will notify you  of labs when available.   Attention to resistance training  And cross training exercise .    Wellness and  Med evaluation in a year . You can get appt with our wellness coach (for screening   Issues  Next year and appt with me for physical exam and medication management .  Health Maintenance, Female Adopting a healthy lifestyle and getting preventive care can go a long way to promote health and wellness. Talk with your health care provider about what schedule of regular examinations is right for you. This is a good chance for you to check in with your provider about disease prevention and staying healthy. In between checkups, there are plenty of things you can do on your own. Experts have done a lot of research about which lifestyle changes and preventive measures are most likely to keep you healthy. Ask your health care provider for more information. WEIGHT AND DIET  Eat a healthy diet  Be sure to include plenty of vegetables, fruits, low-fat dairy products, and lean protein.  Do not eat a lot of foods high in solid fats, added  sugars, or salt.  Get regular exercise. This is one of the most important things you can do for your health.  Most adults should exercise for at least 150 minutes each week. The exercise should increase your heart rate and make you sweat (moderate-intensity exercise).  Most adults should also do strengthening exercises at least twice a week. This is in addition to the moderate-intensity exercise.  Maintain a healthy weight  Body mass index (BMI) is a measurement that can be used to identify possible weight problems. It estimates body fat based on height and weight. Your health care provider can help determine your BMI and help you achieve or maintain a healthy weight.  For females 29 years of age and older:   A BMI below 18.5 is considered underweight.  A BMI of 18.5 to 24.9 is normal.  A BMI of 25 to 29.9 is considered overweight.  A BMI of 30 and above is considered obese.  Watch levels of cholesterol and blood lipids  You should start having your blood tested for lipids and cholesterol at 71 years of age, then have this test every 5 years.  You may need to have your cholesterol levels checked more often if:  Your lipid or cholesterol levels are high.  You are older than 71 years of age.  You are at high risk for heart disease.  CANCER SCREENING   Lung Cancer  Lung cancer screening is recommended for adults 76-60 years old who are at high risk for lung cancer because of a history of smoking.  A yearly low-dose CT scan of the lungs is recommended for people who:  Currently smoke.  Have quit within the past  15 years.  Have at least a 30-pack-year history of smoking. A pack year is smoking an average of one pack of cigarettes a day for 1 year.  Yearly screening should continue until it has been 15 years since you quit.  Yearly screening should stop if you develop a health problem that would prevent you from having lung cancer treatment.  Breast Cancer  Practice  breast self-awareness. This means understanding how your breasts normally appear and feel.  It also means doing regular breast self-exams. Let your health care provider know about any changes, no matter how small.  If you are in your 20s or 30s, you should have a clinical breast exam (CBE) by a health care provider every 1-3 years as part of a regular health exam.  If you are 21 or older, have a CBE every year. Also consider having a breast X-ray (mammogram) every year.  If you have a family history of breast cancer, talk to your health care provider about genetic screening.  If you are at high risk for breast cancer, talk to your health care provider about having an MRI and a mammogram every year.  Breast cancer gene (BRCA) assessment is recommended for women who have family members with BRCA-related cancers. BRCA-related cancers include:  Breast.  Ovarian.  Tubal.  Peritoneal cancers.  Results of the assessment will determine the need for genetic counseling and BRCA1 and BRCA2 testing. Cervical Cancer Your health care provider may recommend that you be screened regularly for cancer of the pelvic organs (ovaries, uterus, and vagina). This screening involves a pelvic examination, including checking for microscopic changes to the surface of your cervix (Pap test). You may be encouraged to have this screening done every 3 years, beginning at age 36.  For women ages 69-65, health care providers may recommend pelvic exams and Pap testing every 3 years, or they may recommend the Pap and pelvic exam, combined with testing for human papilloma virus (HPV), every 5 years. Some types of HPV increase your risk of cervical cancer. Testing for HPV may also be done on women of any age with unclear Pap test results.  Other health care providers may not recommend any screening for nonpregnant women who are considered low risk for pelvic cancer and who do not have symptoms. Ask your health care provider  if a screening pelvic exam is right for you.  If you have had past treatment for cervical cancer or a condition that could lead to cancer, you need Pap tests and screening for cancer for at least 20 years after your treatment. If Pap tests have been discontinued, your risk factors (such as having a new sexual partner) need to be reassessed to determine if screening should resume. Some women have medical problems that increase the chance of getting cervical cancer. In these cases, your health care provider may recommend more frequent screening and Pap tests. Colorectal Cancer  This type of cancer can be detected and often prevented.  Routine colorectal cancer screening usually begins at 71 years of age and continues through 70 years of age.  Your health care provider may recommend screening at an earlier age if you have risk factors for colon cancer.  Your health care provider may also recommend using home test kits to check for hidden blood in the stool.  A small camera at the end of a tube can be used to examine your colon directly (sigmoidoscopy or colonoscopy). This is done to check for the earliest forms of  colorectal cancer.  Routine screening usually begins at age 26.  Direct examination of the colon should be repeated every 5-10 years through 71 years of age. However, you may need to be screened more often if early forms of precancerous polyps or small growths are found. Skin Cancer  Check your skin from head to toe regularly.  Tell your health care provider about any new moles or changes in moles, especially if there is a change in a mole's shape or color.  Also tell your health care provider if you have a mole that is larger than the size of a pencil eraser.  Always use sunscreen. Apply sunscreen liberally and repeatedly throughout the day.  Protect yourself by wearing long sleeves, pants, a wide-brimmed hat, and sunglasses whenever you are outside. HEART DISEASE, DIABETES, AND  HIGH BLOOD PRESSURE   High blood pressure causes heart disease and increases the risk of stroke. High blood pressure is more likely to develop in:  People who have blood pressure in the high end of the normal range (130-139/85-89 mm Hg).  People who are overweight or obese.  People who are African American.  If you are 75-38 years of age, have your blood pressure checked every 3-5 years. If you are 46 years of age or older, have your blood pressure checked every year. You should have your blood pressure measured twice--once when you are at a hospital or clinic, and once when you are not at a hospital or clinic. Record the average of the two measurements. To check your blood pressure when you are not at a hospital or clinic, you can use:  An automated blood pressure machine at a pharmacy.  A home blood pressure monitor.  If you are between 16 years and 32 years old, ask your health care provider if you should take aspirin to prevent strokes.  Have regular diabetes screenings. This involves taking a blood sample to check your fasting blood sugar level.  If you are at a normal weight and have a low risk for diabetes, have this test once every three years after 71 years of age.  If you are overweight and have a high risk for diabetes, consider being tested at a younger age or more often. PREVENTING INFECTION  Hepatitis B  If you have a higher risk for hepatitis B, you should be screened for this virus. You are considered at high risk for hepatitis B if:  You were born in a country where hepatitis B is common. Ask your health care provider which countries are considered high risk.  Your parents were born in a high-risk country, and you have not been immunized against hepatitis B (hepatitis B vaccine).  You have HIV or AIDS.  You use needles to inject street drugs.  You live with someone who has hepatitis B.  You have had sex with someone who has hepatitis B.  You get hemodialysis  treatment.  You take certain medicines for conditions, including cancer, organ transplantation, and autoimmune conditions. Hepatitis C  Blood testing is recommended for:  Everyone born from [birth year] through 1966.  Anyone with known risk factors for hepatitis C. Sexually transmitted infections (STIs)  You should be screened for sexually transmitted infections (STIs) including gonorrhea and chlamydia if:  You are sexually active and are younger than 71 years of age.  You are older than 71 years of age and your health care provider tells you that you are at risk for this type of infection.  Your sexual  activity has changed since you were last screened and you are at an increased risk for chlamydia or gonorrhea. Ask your health care provider if you are at risk.  If you do not have HIV, but are at risk, it may be recommended that you take a prescription medicine daily to prevent HIV infection. This is called pre-exposure prophylaxis (PrEP). You are considered at risk if:  You are sexually active and do not regularly use condoms or know the HIV status of your partner(s).  You take drugs by injection.  You are sexually active with a partner who has HIV. Talk with your health care provider about whether you are at high risk of being infected with HIV. If you choose to begin PrEP, you should first be tested for HIV. You should then be tested every 3 months for as long as you are taking PrEP.  PREGNANCY   If you are premenopausal and you may become pregnant, ask your health care provider about preconception counseling.  If you may become pregnant, take 400 to 800 micrograms (mcg) of folic acid every day.  If you want to prevent pregnancy, talk to your health care provider about birth control (contraception). OSTEOPOROSIS AND MENOPAUSE   Osteoporosis is a disease in which the bones lose minerals and strength with aging. This can result in serious bone fractures. Your risk for osteoporosis can  be identified using a bone density scan.  If you are 18 years of age or older, or if you are at risk for osteoporosis and fractures, ask your health care provider if you should be screened.  Ask your health care provider whether you should take a calcium or vitamin D supplement to lower your risk for osteoporosis.  Menopause may have certain physical symptoms and risks.  Hormone replacement therapy may reduce some of these symptoms and risks. Talk to your health care provider about whether hormone replacement therapy is right for you.  HOME CARE INSTRUCTIONS   Schedule regular health, dental, and eye exams.  Stay current with your immunizations.   Do not use any tobacco products including cigarettes, chewing tobacco, or electronic cigarettes.  If you are pregnant, do not drink alcohol.  If you are breastfeeding, limit how much and how often you drink alcohol.  Limit alcohol intake to no more than 1 drink per day for nonpregnant women. One drink equals 12 ounces of beer, 5 ounces of wine, or 1 ounces of hard liquor.  Do not use street drugs.  Do not share needles.  Ask your health care provider for help if you need support or information about quitting drugs.  Tell your health care provider if you often feel depressed.  Tell your health care provider if you have ever been abused or do not feel safe at home.   This information is not intended to replace advice given to you by your health care provider. Make sure you discuss any questions you have with your health care provider.   Document Released: 10/19/2010 Document Revised: 04/26/2014 Document Reviewed: 03/07/2013 Elsevier Interactive Patient Education 2016 [place] K. Emaya Preston M.D.

## 2015-11-25 ENCOUNTER — Encounter: Payer: Self-pay | Admitting: Internal Medicine

## 2015-11-25 ENCOUNTER — Ambulatory Visit (INDEPENDENT_AMBULATORY_CARE_PROVIDER_SITE_OTHER): Payer: Medicare Other | Admitting: Internal Medicine

## 2015-11-25 VITALS — BP 136/74 | Temp 97.9°F | Ht 65.5 in | Wt 143.4 lb

## 2015-11-25 DIAGNOSIS — K5090 Crohn's disease, unspecified, without complications: Secondary | ICD-10-CM | POA: Diagnosis not present

## 2015-11-25 DIAGNOSIS — Z79899 Other long term (current) drug therapy: Secondary | ICD-10-CM | POA: Diagnosis not present

## 2015-11-25 DIAGNOSIS — M47812 Spondylosis without myelopathy or radiculopathy, cervical region: Secondary | ICD-10-CM

## 2015-11-25 DIAGNOSIS — L719 Rosacea, unspecified: Secondary | ICD-10-CM

## 2015-11-25 DIAGNOSIS — E785 Hyperlipidemia, unspecified: Secondary | ICD-10-CM

## 2015-11-25 DIAGNOSIS — Z974 Presence of external hearing-aid: Secondary | ICD-10-CM | POA: Diagnosis not present

## 2015-11-25 DIAGNOSIS — J300 Vasomotor rhinitis: Secondary | ICD-10-CM

## 2015-11-25 DIAGNOSIS — E538 Deficiency of other specified B group vitamins: Secondary | ICD-10-CM | POA: Diagnosis not present

## 2015-11-25 DIAGNOSIS — Z0000 Encounter for general adult medical examination without abnormal findings: Secondary | ICD-10-CM | POA: Diagnosis not present

## 2015-11-25 DIAGNOSIS — M4692 Unspecified inflammatory spondylopathy, cervical region: Secondary | ICD-10-CM

## 2015-11-25 DIAGNOSIS — K5080 Crohn's disease of both small and large intestine without complications: Secondary | ICD-10-CM

## 2015-11-25 LAB — CBC WITH DIFFERENTIAL/PLATELET
BASOS ABS: 0 10*3/uL (ref 0.0–0.1)
BASOS PCT: 0.4 % (ref 0.0–3.0)
EOS ABS: 0.1 10*3/uL (ref 0.0–0.7)
Eosinophils Relative: 2.6 % (ref 0.0–5.0)
HEMATOCRIT: 41.1 % (ref 36.0–46.0)
HEMOGLOBIN: 14.1 g/dL (ref 12.0–15.0)
LYMPHS PCT: 21.5 % (ref 12.0–46.0)
Lymphs Abs: 1 10*3/uL (ref 0.7–4.0)
MCHC: 34.2 g/dL (ref 30.0–36.0)
MCV: 92.5 fl (ref 78.0–100.0)
MONOS PCT: 7.5 % (ref 3.0–12.0)
Monocytes Absolute: 0.4 10*3/uL (ref 0.1–1.0)
NEUTROS ABS: 3.3 10*3/uL (ref 1.4–7.7)
Neutrophils Relative %: 68 % (ref 43.0–77.0)
Platelets: 226 10*3/uL (ref 150.0–400.0)
RBC: 4.44 Mil/uL (ref 3.87–5.11)
RDW: 12.7 % (ref 11.5–15.5)
WBC: 4.8 10*3/uL (ref 4.0–10.5)

## 2015-11-25 LAB — LIPID PANEL
CHOL/HDL RATIO: 4
Cholesterol: 152 mg/dL (ref 0–200)
HDL: 40.4 mg/dL (ref >39.00)
LDL Cholesterol: 76 mg/dL (ref 0–99)
NONHDL: 111.68
Triglycerides: 177 mg/dL — ABNORMAL HIGH (ref 0.0–149.0)
VLDL: 35.4 mg/dL (ref 0.0–40.0)

## 2015-11-25 LAB — HEPATIC FUNCTION PANEL
ALK PHOS: 68 U/L (ref 39–117)
ALT: 21 U/L (ref 0–35)
AST: 23 U/L (ref 0–37)
Albumin: 4.4 g/dL (ref 3.5–5.2)
BILIRUBIN TOTAL: 0.4 mg/dL (ref 0.2–1.2)
Bilirubin, Direct: 0.1 mg/dL (ref 0.0–0.3)
Total Protein: 7.1 g/dL (ref 6.0–8.3)

## 2015-11-25 LAB — BASIC METABOLIC PANEL
BUN: 15 mg/dL (ref 6–23)
CALCIUM: 9.7 mg/dL (ref 8.4–10.5)
CHLORIDE: 102 meq/L (ref 96–112)
CO2: 30 meq/L (ref 19–32)
CREATININE: 0.71 mg/dL (ref 0.40–1.20)
GFR: 86.34 mL/min (ref >60.00)
Glucose, Bld: 89 mg/dL (ref 70–99)
Potassium: 4.3 mEq/L (ref 3.5–5.1)
Sodium: 139 mEq/L (ref 135–145)

## 2015-11-25 LAB — TSH: TSH: 2.82 u[IU]/mL (ref 0.35–4.50)

## 2015-11-25 LAB — VITAMIN B12: VITAMIN B 12: 588 pg/mL (ref 211–911)

## 2015-11-25 LAB — C-REACTIVE PROTEIN: CRP: 0.3 mg/dL — AB (ref 0.5–20.0)

## 2015-11-25 MED ORDER — IPRATROPIUM BROMIDE 0.06 % NA SOLN: NASAL | 0 refills | Status: DC

## 2015-11-25 MED ORDER — CYCLOBENZAPRINE HCL 10 MG PO TABS: ORAL_TABLET | ORAL | 0 refills | Status: DC

## 2015-11-25 MED ORDER — SIMVASTATIN 40 MG PO TABS: 40.0000 mg | ORAL_TABLET | Freq: Every day | ORAL | 3 refills | Status: DC

## 2015-11-25 MED ORDER — AZELAIC ACID 15 % EX GEL: CUTANEOUS | 11 refills | Status: DC

## 2015-11-25 MED ORDER — VALACYCLOVIR HCL 1 G PO TABS: 1000.0000 mg | ORAL_TABLET | Freq: Every day | ORAL | 11 refills | Status: DC | PRN

## 2015-11-25 NOTE — Patient Instructions (Signed)
Continue lifestyle intervention healthy eating and exercise . Will notify you  of labs when available.   Attention to resistance training  And cross training exercise .    Wellness and  Med evaluation in a year . You can get appt with our wellness coach (for screening   Issues  Next year and appt with me for physical exam and medication management .  Health Maintenance, Female Adopting a healthy lifestyle and getting preventive care can go a long way to promote health and wellness. Talk with your health care provider about what schedule of regular examinations is right for you. This is a good chance for you to check in with your provider about disease prevention and staying healthy. In between checkups, there are plenty of things you can do on your own. Experts have done a lot of research about which lifestyle changes and preventive measures are most likely to keep you healthy. Ask your health care provider for more information. WEIGHT AND DIET  Eat a healthy diet  Be sure to include plenty of vegetables, fruits, low-fat dairy products, and lean protein.  Do not eat a lot of foods high in solid fats, added sugars, or salt.  Get regular exercise. This is one of the most important things you can do for your health.  Most adults should exercise for at least 150 minutes each week. The exercise should increase your heart rate and make you sweat (moderate-intensity exercise).  Most adults should also do strengthening exercises at least twice a week. This is in addition to the moderate-intensity exercise.  Maintain a healthy weight  Body mass index (BMI) is a measurement that can be used to identify possible weight problems. It estimates body fat based on height and weight. Your health care provider can help determine your BMI and help you achieve or maintain a healthy weight.  For females 74 years of age and older:   A BMI below 18.5 is considered underweight.  A BMI of 18.5 to 24.9 is  normal.  A BMI of 25 to 29.9 is considered overweight.  A BMI of 30 and above is considered obese.  Watch levels of cholesterol and blood lipids  You should start having your blood tested for lipids and cholesterol at 71 years of age, then have this test every 5 years.  You may need to have your cholesterol levels checked more often if:  Your lipid or cholesterol levels are high.  You are older than 71 years of age.  You are at high risk for heart disease.  CANCER SCREENING   Lung Cancer  Lung cancer screening is recommended for adults 64-46 years old who are at high risk for lung cancer because of a history of smoking.  A yearly low-dose CT scan of the lungs is recommended for people who:  Currently smoke.  Have quit within the past 15 years.  Have at least a 30-pack-year history of smoking. A pack year is smoking an average of one pack of cigarettes a day for 1 year.  Yearly screening should continue until it has been 15 years since you quit.  Yearly screening should stop if you develop a health problem that would prevent you from having lung cancer treatment.  Breast Cancer  Practice breast self-awareness. This means understanding how your breasts normally appear and feel.  It also means doing regular breast self-exams. Let your health care provider know about any changes, no matter how small.  If you are in your 42s  or 18s, you should have a clinical breast exam (CBE) by a health care provider every 1-3 years as part of a regular health exam.  If you are 58 or older, have a CBE every year. Also consider having a breast X-ray (mammogram) every year.  If you have a family history of breast cancer, talk to your health care provider about genetic screening.  If you are at high risk for breast cancer, talk to your health care provider about having an MRI and a mammogram every year.  Breast cancer gene (BRCA) assessment is recommended for women who have family members  with BRCA-related cancers. BRCA-related cancers include:  Breast.  Ovarian.  Tubal.  Peritoneal cancers.  Results of the assessment will determine the need for genetic counseling and BRCA1 and BRCA2 testing. Cervical Cancer Your health care provider may recommend that you be screened regularly for cancer of the pelvic organs (ovaries, uterus, and vagina). This screening involves a pelvic examination, including checking for microscopic changes to the surface of your cervix (Pap test). You may be encouraged to have this screening done every 3 years, beginning at age 70.  For women ages 11-65, health care providers may recommend pelvic exams and Pap testing every 3 years, or they may recommend the Pap and pelvic exam, combined with testing for human papilloma virus (HPV), every 5 years. Some types of HPV increase your risk of cervical cancer. Testing for HPV may also be done on women of any age with unclear Pap test results.  Other health care providers may not recommend any screening for nonpregnant women who are considered low risk for pelvic cancer and who do not have symptoms. Ask your health care provider if a screening pelvic exam is right for you.  If you have had past treatment for cervical cancer or a condition that could lead to cancer, you need Pap tests and screening for cancer for at least 20 years after your treatment. If Pap tests have been discontinued, your risk factors (such as having a new sexual partner) need to be reassessed to determine if screening should resume. Some women have medical problems that increase the chance of getting cervical cancer. In these cases, your health care provider may recommend more frequent screening and Pap tests. Colorectal Cancer  This type of cancer can be detected and often prevented.  Routine colorectal cancer screening usually begins at 71 years of age and continues through 71 years of age.  Your health care provider may recommend  screening at an earlier age if you have risk factors for colon cancer.  Your health care provider may also recommend using home test kits to check for hidden blood in the stool.  A small camera at the end of a tube can be used to examine your colon directly (sigmoidoscopy or colonoscopy). This is done to check for the earliest forms of colorectal cancer.  Routine screening usually begins at age 39.  Direct examination of the colon should be repeated every 5-10 years through 71 years of age. However, you may need to be screened more often if early forms of precancerous polyps or small growths are found. Skin Cancer  Check your skin from head to toe regularly.  Tell your health care provider about any new moles or changes in moles, especially if there is a change in a mole's shape or color.  Also tell your health care provider if you have a mole that is larger than the size of a pencil eraser.  Always use sunscreen. Apply sunscreen liberally and repeatedly throughout the day.  Protect yourself by wearing long sleeves, pants, a wide-brimmed hat, and sunglasses whenever you are outside. HEART DISEASE, DIABETES, AND HIGH BLOOD PRESSURE   High blood pressure causes heart disease and increases the risk of stroke. High blood pressure is more likely to develop in:  People who have blood pressure in the high end of the normal range (130-139/85-89 mm Hg).  People who are overweight or obese.  People who are African American.  If you are 37-81 years of age, have your blood pressure checked every 3-5 years. If you are 95 years of age or older, have your blood pressure checked every year. You should have your blood pressure measured twice--once when you are at a hospital or clinic, and once when you are not at a hospital or clinic. Record the average of the two measurements. To check your blood pressure when you are not at a hospital or clinic, you can use:  An automated blood pressure machine at a  pharmacy.  A home blood pressure monitor.  If you are between 51 years and 29 years old, ask your health care provider if you should take aspirin to prevent strokes.  Have regular diabetes screenings. This involves taking a blood sample to check your fasting blood sugar level.  If you are at a normal weight and have a low risk for diabetes, have this test once every three years after 71 years of age.  If you are overweight and have a high risk for diabetes, consider being tested at a younger age or more often. PREVENTING INFECTION  Hepatitis B  If you have a higher risk for hepatitis B, you should be screened for this virus. You are considered at high risk for hepatitis B if:  You were born in a country where hepatitis B is common. Ask your health care provider which countries are considered high risk.  Your parents were born in a high-risk country, and you have not been immunized against hepatitis B (hepatitis B vaccine).  You have HIV or AIDS.  You use needles to inject street drugs.  You live with someone who has hepatitis B.  You have had sex with someone who has hepatitis B.  You get hemodialysis treatment.  You take certain medicines for conditions, including cancer, organ transplantation, and autoimmune conditions. Hepatitis C  Blood testing is recommended for:  Everyone born from [birth year] through 1966.  Anyone with known risk factors for hepatitis C. Sexually transmitted infections (STIs)  You should be screened for sexually transmitted infections (STIs) including gonorrhea and chlamydia if:  You are sexually active and are younger than 71 years of age.  You are older than 71 years of age and your health care provider tells you that you are at risk for this type of infection.  Your sexual activity has changed since you were last screened and you are at an increased risk for chlamydia or gonorrhea. Ask your health care provider if you are at risk.  If you do not  have HIV, but are at risk, it may be recommended that you take a prescription medicine daily to prevent HIV infection. This is called pre-exposure prophylaxis (PrEP). You are considered at risk if:  You are sexually active and do not regularly use condoms or know the HIV status of your partner(s).  You take drugs by injection.  You are sexually active with a partner who has HIV. Talk with your health  care provider about whether you are at high risk of being infected with HIV. If you choose to begin PrEP, you should first be tested for HIV. You should then be tested every 3 months for as long as you are taking PrEP.  PREGNANCY   If you are premenopausal and you may become pregnant, ask your health care provider about preconception counseling.  If you may become pregnant, take 400 to 800 micrograms (mcg) of folic acid every day.  If you want to prevent pregnancy, talk to your health care provider about birth control (contraception). OSTEOPOROSIS AND MENOPAUSE   Osteoporosis is a disease in which the bones lose minerals and strength with aging. This can result in serious bone fractures. Your risk for osteoporosis can be identified using a bone density scan.  If you are 54 years of age or older, or if you are at risk for osteoporosis and fractures, ask your health care provider if you should be screened.  Ask your health care provider whether you should take a calcium or vitamin D supplement to lower your risk for osteoporosis.  Menopause may have certain physical symptoms and risks.  Hormone replacement therapy may reduce some of these symptoms and risks. Talk to your health care provider about whether hormone replacement therapy is right for you.  HOME CARE INSTRUCTIONS   Schedule regular health, dental, and eye exams.  Stay current with your immunizations.   Do not use any tobacco products including cigarettes, chewing tobacco, or electronic cigarettes.  If you are pregnant, do not  drink alcohol.  If you are breastfeeding, limit how much and how often you drink alcohol.  Limit alcohol intake to no more than 1 drink per day for nonpregnant women. One drink equals 12 ounces of beer, 5 ounces of wine, or 1 ounces of hard liquor.  Do not use street drugs.  Do not share needles.  Ask your health care provider for help if you need support or information about quitting drugs.  Tell your health care provider if you often feel depressed.  Tell your health care provider if you have ever been abused or do not feel safe at home.   This information is not intended to replace advice given to you by your health care provider. Make sure you discuss any questions you have with your health care provider.   Document Released: 10/19/2010 Document Revised: 04/26/2014 Document Reviewed: 03/07/2013 Elsevier Interactive Patient Education [employer].

## 2015-12-02 DIAGNOSIS — H903 Sensorineural hearing loss, bilateral: Secondary | ICD-10-CM | POA: Insufficient documentation

## 2015-12-02 DIAGNOSIS — Z974 Presence of external hearing-aid: Secondary | ICD-10-CM | POA: Diagnosis not present

## 2015-12-02 DIAGNOSIS — H6123 Impacted cerumen, bilateral: Secondary | ICD-10-CM | POA: Diagnosis not present

## 2015-12-02 DIAGNOSIS — K219 Gastro-esophageal reflux disease without esophagitis: Secondary | ICD-10-CM | POA: Diagnosis not present

## 2015-12-02 DIAGNOSIS — R490 Dysphonia: Secondary | ICD-10-CM | POA: Diagnosis not present

## 2015-12-09 ENCOUNTER — Ambulatory Visit: Payer: Self-pay | Admitting: Internal Medicine

## 2016-01-12 DIAGNOSIS — Z23 Encounter for immunization: Secondary | ICD-10-CM | POA: Diagnosis not present

## 2016-02-03 DIAGNOSIS — L298 Other pruritus: Secondary | ICD-10-CM | POA: Diagnosis not present

## 2016-02-03 DIAGNOSIS — D1801 Hemangioma of skin and subcutaneous tissue: Secondary | ICD-10-CM | POA: Diagnosis not present

## 2016-02-03 DIAGNOSIS — D2271 Melanocytic nevi of right lower limb, including hip: Secondary | ICD-10-CM | POA: Diagnosis not present

## 2016-02-03 DIAGNOSIS — D2272 Melanocytic nevi of left lower limb, including hip: Secondary | ICD-10-CM | POA: Diagnosis not present

## 2016-02-03 DIAGNOSIS — L918 Other hypertrophic disorders of the skin: Secondary | ICD-10-CM | POA: Diagnosis not present

## 2016-02-03 DIAGNOSIS — Z85828 Personal history of other malignant neoplasm of skin: Secondary | ICD-10-CM | POA: Diagnosis not present

## 2016-02-03 DIAGNOSIS — D225 Melanocytic nevi of trunk: Secondary | ICD-10-CM | POA: Diagnosis not present

## 2016-02-16 DIAGNOSIS — H903 Sensorineural hearing loss, bilateral: Secondary | ICD-10-CM | POA: Diagnosis not present

## 2016-02-25 DIAGNOSIS — H1132 Conjunctival hemorrhage, left eye: Secondary | ICD-10-CM | POA: Diagnosis not present

## 2016-02-25 DIAGNOSIS — H01004 Unspecified blepharitis left upper eyelid: Secondary | ICD-10-CM | POA: Diagnosis not present

## 2016-03-23 ENCOUNTER — Other Ambulatory Visit: Payer: Self-pay | Admitting: Internal Medicine

## 2016-03-25 NOTE — Telephone Encounter (Signed)
Is this a continuous medication?

## 2016-03-26 NOTE — Telephone Encounter (Signed)
It can be daily or as needed   Ok to fill long term  Until next yearly

## 2016-03-26 NOTE — Telephone Encounter (Signed)
Sent to the pharmacy by e-scribe. 

## 2016-04-27 ENCOUNTER — Telehealth: Payer: Self-pay | Admitting: Allergy and Immunology

## 2016-04-27 NOTE — Telephone Encounter (Signed)
Please schedule patient for new patient appointment. Thank you!!! 

## 2016-04-27 NOTE — Telephone Encounter (Signed)
Patient said she was seen years ago by Dr. Neldon Mc. The only date I could find in [facility] was 01-11-06. She said when she was seen, she said she was having an allergic reaction to Lidocaine and was not tested for that. She has since found out that it had tetracaine in it and wants to know if she can be tested just for Lidocaine?

## 2016-05-05 ENCOUNTER — Ambulatory Visit: Payer: Medicare Other | Admitting: Allergy and Immunology

## 2016-05-25 DIAGNOSIS — H52223 Regular astigmatism, bilateral: Secondary | ICD-10-CM | POA: Diagnosis not present

## 2016-05-25 DIAGNOSIS — H04123 Dry eye syndrome of bilateral lacrimal glands: Secondary | ICD-10-CM | POA: Diagnosis not present

## 2016-05-25 DIAGNOSIS — H5203 Hypermetropia, bilateral: Secondary | ICD-10-CM | POA: Diagnosis not present

## 2016-05-25 DIAGNOSIS — H25813 Combined forms of age-related cataract, bilateral: Secondary | ICD-10-CM | POA: Diagnosis not present

## 2016-05-25 DIAGNOSIS — H524 Presbyopia: Secondary | ICD-10-CM | POA: Diagnosis not present

## 2016-06-08 ENCOUNTER — Ambulatory Visit (INDEPENDENT_AMBULATORY_CARE_PROVIDER_SITE_OTHER): Payer: Medicare Other | Admitting: Allergy and Immunology

## 2016-06-08 ENCOUNTER — Encounter: Payer: Self-pay | Admitting: Allergy and Immunology

## 2016-06-08 VITALS — BP 132/72 | HR 72 | Temp 98.1°F | Resp 16 | Ht 65.2 in | Wt 142.0 lb

## 2016-06-08 DIAGNOSIS — T50905A Adverse effect of unspecified drugs, medicaments and biological substances, initial encounter: Secondary | ICD-10-CM

## 2016-06-08 DIAGNOSIS — T887XXA Unspecified adverse effect of drug or medicament, initial encounter: Secondary | ICD-10-CM | POA: Diagnosis not present

## 2016-06-08 NOTE — Patient Instructions (Addendum)
  1. Return Tuesday morning for lidocaine injection allergy  2. May want to consider patch testing for lidocaine topical allergy  3. May require further evaluation for possible penicillin allergy

## 2016-06-08 NOTE — Progress Notes (Signed)
Dear Dr. Regis Bill,  Thank you for referring Bridget Reyes to the [facility] on 06/08/2016.   Below is a summation of this patient's evaluation and recommendations.  Thank you for your referral. I will keep you informed about this patient's response to treatment.   If you have any questions please do not hesitate to contact me.   Sincerely,  Jiles Prows, MD Allergy / Immunology [place]   ______________________________________________________________________    NEW PATIENT NOTE  Referring Provider: Burnis Medin, MD Primary Provider: Lottie Dawson, MD Date of office visit: 06/08/2016    Subjective:   Chief Complaint:  Bridget Reyes (DOB: 1945/06/05) is a 71 y.o. female who presents to the clinic on 06/08/2016 with a chief complaint of Allergic Reaction .     HPI: Bridget Reyes presents this clinic in evaluation of possible lidocaine allergy.  Apparently over 10 years ago she had a topical mixture of lidocaine and tetracaine apply to her face and then received laser therapy. 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 She apparently had evaluation for topical anesthetic allergy with a patch test in the past and apparently this was positive to that mixture of lidocaine and tetracaine. She had evaluation for injection topical anesthetic allergy and apparently was tested for Carbocaine allergy which was negative and she's been using Carbocaine injections for procedures ever since that point in time.  She is interested in undergoing evaluation for possible lidocaine injection allergy.   As well, she does relate a history of possible penicillin allergy that she is interested in working through. Apparently in her 27s she was given 2 courses of penicillin back to back and on her second course she  developed hives on her lower extremities. She can't really remember any other details. She knows that somehow her record has been labeled as having an amoxicillin allergy but she can never remember taking amoxicillin.  Past Medical History:  Diagnosis Date  . Allergy   . Arthritis    knees, neck - no meds  . Cataract    bilateral - MD just watchning  . Crohn's   . Eczema   . Esophageal reflux   . Gastritis   . Hiatal hernia   . Hyperlipemia   . Ileitis   . Migraines    tx with naproxen - last one 4 yrs ago  . Rosacea   . Skin cancer of face    scca removed     Past Surgical History:  Procedure Laterality Date  . BREAST BIOPSY Left 1980  . COLONOSCOPY  08/2015   Pyrtle -polyp  . TONSILLECTOMY    . TOTAL VAGINAL HYSTERECTOMY  1994  . UPPER GI ENDOSCOPY     hiatal hernia  . WISDOM TOOTH EXTRACTION      Allergies as of 06/08/2016      Reactions   Sulfamethoxazole-trimethoprim Hives   REACTION: rash   Amoxicillin    REACTION: unspecified   Bactrim [sulfamethoxazole-trimethoprim]    Lidocaine Swelling   Swelling and itching    Penicillins Hives   Sertraline    Hair loss    Tetracaine Itching, Swelling   Topamax [topiramate]    Hair loss   Remeron [mirtazapine] Other (See Comments)   Increased ravenous appetite      Medication List      acetaminophen  500 MG tablet Commonly known as:  TYLENOL Take 500 mg by mouth.   Azelaic Acid 15 % cream Commonly known as:  FINACEA After skin is thoroughly washed and patted dry, gently but thoroughly massage a thin film of azelaic acid cream into the affected area twice daily, in the morning and evening.   Estradiol 10 MCG Tabs vaginal tablet Commonly known as:  VAGIFEM Place 1 tablet (10 mcg total) vaginally as directed.   fluticasone 50 MCG/ACT nasal spray Commonly known as:  FLONASE Place 2 sprays into the nose daily.   ipratropium 0.06 % nasal spray Commonly known as:  ATROVENT instill 2 sprays into each nostril  twice a day to three times a day   mesalamine 500 MG CR capsule Commonly known as:  PENTASA Take three tablet by mouth twice a day   MULTIPLE VITAMIN PO Take 1 tablet by mouth daily.   naproxen 500 MG tablet Commonly known as:  NAPROSYN Reported on 10/24/2015   omeprazole 40 MG capsule Commonly known as:  PRILOSEC Take 1 capsule (40 mg total) by mouth daily.   simvastatin 40 MG tablet Commonly known as:  ZOCOR Take 1 tablet (40 mg total) by mouth at bedtime.       Review of systems negative except as noted in HPI / PMHx or noted below:  Review of Systems  Constitutional: Negative.   HENT: Negative.   Eyes: Negative.   Respiratory: Negative.   Cardiovascular: Negative.   Gastrointestinal: Negative.   Genitourinary: Negative.   Musculoskeletal: Negative.   Skin: Negative.   Neurological: Negative.   Endo/Heme/Allergies: Negative.   Psychiatric/Behavioral: Negative.     Family History  Problem Relation Age of Onset  . Parkinsonism Mother   . Lung cancer Mother   . Heart disease Father     died chf in 77 s  . Hypertension Father   . Congestive Heart Failure Father   . Breast cancer Cousin   . Colon cancer Neg Hx   . Esophageal cancer Neg Hx   . Rectal cancer Neg Hx   . Stomach cancer Neg Hx     Social History   Social History  . Marital status: Married    Spouse name: N/A  . Number of children: 2  . Years of education: N/A   Occupational History  . retired      Education officer, museum  .  Unemployed   Social History Main Topics  . Smoking status: Never Smoker  . Smokeless tobacco: Never Used  . Alcohol use 1.8 - 2.4 oz/week    3 - 4 Glasses of wine per week     Comment: occ  . Drug use: No  . Sexual activity: Yes    Partners: Male    Birth control/ protection: Surgical     Comment: hysterectomy   Other Topics Concern  . Not on file   Social History Narrative   hh  Of 2       Dog    Married    Publishing rights manager and Social history  Lives  in a house with a dry environment, no animals located inside the household, hardwood in the bedroom, plastic on the bed but not the pillow, no smoking ongoing with inside the household.  Objective:   Vitals:   06/08/16 0910  BP: 132/72  Pulse: 72  Resp: 16  Temp: 98.1 F (36.7 C)   Height: 5' 5.2" (165.6 cm) Weight: 142 lb (64.4 kg)  Physical  Exam  Constitutional: She is well-developed, well-nourished, and in no distress.  HENT:  Head: Normocephalic.  Right Ear: Tympanic membrane, external ear and ear canal normal.  Left Ear: Tympanic membrane, external ear and ear canal normal.  Nose: Nose normal. No mucosal edema or rhinorrhea.  Mouth/Throat: Uvula is midline, oropharynx is clear and moist and mucous membranes are normal. No oropharyngeal exudate.  Eyes: Conjunctivae are normal.  Neck: Trachea normal. No tracheal tenderness present. No tracheal deviation present. No thyromegaly present.  Cardiovascular: Normal rate, regular rhythm, S1 normal, S2 normal and normal heart sounds.   No murmur heard. Pulmonary/Chest: Breath sounds normal. No stridor. No respiratory distress. She has no wheezes. She has no rales.  Musculoskeletal: She exhibits no edema.  Lymphadenopathy:       Head (right side): No tonsillar adenopathy present.       Head (left side): No tonsillar adenopathy present.    She has no cervical adenopathy.  Neurological: She is alert. Gait normal.  Skin: No rash noted. She is not diaphoretic. No erythema. Nails show no clubbing.  Psychiatric: Mood and affect normal.    Diagnostics: Allergy skin tests were not performed.   Assessment and Plan:    1. Adverse effect of drug, initial encounter     1. Return Tuesday morning for lidocaine injection allergy  2. May want to consider patch testing for lidocaine topical allergy  3. May require further evaluation for possible penicillin allergy  Lonn Georgia is interested in undergoing evaluation for lidocaine injection  allergy and we can work through that issue once we obtain a source of lidocaine for that testing which I anticipate will be available for next week. As well, she is considering possible evaluation for a topical contact dermatitis directed against lidocaine which we can work through at some point in the future. As well, she is deciding whether or not she would like to work through the issue of possible penicillin allergy with a in clinic amoxicillin challenge and we will address those other issues when she returns to this clinic for lidocaine injection testing next week.  Jiles Prows, MD [facility]

## 2016-06-15 ENCOUNTER — Ambulatory Visit (INDEPENDENT_AMBULATORY_CARE_PROVIDER_SITE_OTHER): Payer: Medicare Other | Admitting: Allergy and Immunology

## 2016-06-15 ENCOUNTER — Encounter: Payer: Self-pay | Admitting: Allergy and Immunology

## 2016-06-15 VITALS — BP 122/62 | HR 68 | Resp 16

## 2016-06-15 DIAGNOSIS — T50905D Adverse effect of unspecified drugs, medicaments and biological substances, subsequent encounter: Secondary | ICD-10-CM

## 2016-06-15 DIAGNOSIS — T413X5D Adverse effect of local anesthetics, subsequent encounter: Secondary | ICD-10-CM | POA: Diagnosis not present

## 2016-06-15 DIAGNOSIS — L299 Pruritus, unspecified: Secondary | ICD-10-CM | POA: Diagnosis not present

## 2016-06-15 DIAGNOSIS — T887XXD Unspecified adverse effect of drug or medicament, subsequent encounter: Secondary | ICD-10-CM | POA: Diagnosis not present

## 2016-06-15 NOTE — Progress Notes (Signed)
Bridget Reyes presents to this clinic to have a lidocaine challenge performed. Utilizing a combination of epi cutaneous, intradermal, and subcutaneous exposure she was administered approximately 1.6 mL of 1% lidocaine and never had any adverse effects secondary to this exposure.

## 2016-06-19 ENCOUNTER — Other Ambulatory Visit: Payer: Self-pay | Admitting: Internal Medicine

## 2016-06-24 ENCOUNTER — Encounter: Payer: Self-pay | Admitting: *Deleted

## 2016-07-02 ENCOUNTER — Ambulatory Visit (INDEPENDENT_AMBULATORY_CARE_PROVIDER_SITE_OTHER): Payer: Medicare Other | Admitting: Internal Medicine

## 2016-07-02 ENCOUNTER — Encounter: Payer: Self-pay | Admitting: Internal Medicine

## 2016-07-02 VITALS — BP 142/70 | HR 88 | Ht 65.25 in | Wt 143.0 lb

## 2016-07-02 DIAGNOSIS — K219 Gastro-esophageal reflux disease without esophagitis: Secondary | ICD-10-CM | POA: Diagnosis not present

## 2016-07-02 DIAGNOSIS — Z86010 Personal history of colonic polyps: Secondary | ICD-10-CM | POA: Diagnosis not present

## 2016-07-02 DIAGNOSIS — K5000 Crohn's disease of small intestine without complications: Secondary | ICD-10-CM | POA: Diagnosis not present

## 2016-07-02 MED ORDER — MESALAMINE ER 500 MG PO CPCR: ORAL_CAPSULE | ORAL | 3 refills | Status: DC

## 2016-07-02 MED ORDER — OMEPRAZOLE 40 MG PO CPDR: 40.0000 mg | DELAYED_RELEASE_CAPSULE | Freq: Every day | ORAL | 3 refills | Status: DC

## 2016-07-02 NOTE — Progress Notes (Signed)
Subjective:    Patient ID: Bridget Reyes, female    DOB: [birth year], 71 y.o.   MRN: [id number]  HPI Bridget Reyes is a 71 year old female with a past medical history of Crohn's ileitis, GERD with hiatal hernia and predominantly LPR symptoms, hyperlipidemia and migraines who is here for follow-up. She was last seen in the office in March 2017 but for colonoscopy in July 2017. Aunt aunt on omeprazole 40 mg daily and Pentasa 1.5 g twice a day. She is here alone today.  She had an upper endoscopy performed to evaluate noncardiac chest pain, sore throat hoarseness and LPR symptoms. EGD 11/06/2015. This was a normal exam. Biopsies from her esophagus were normal. No evidence of EoE. Biopsies from her stomach showed slight chronic gastritis without H. pylori, metaplasia or dysplasia. She also sees Dr. Wilburn Cornelia with ENT who found evidence of inflammation on laryngoscopy. He recommended that she move her omeprazole to before dinner rather than before breakfast to handle and control some of her nighttime symptoms. This seems to have helped. She has never really had traditional heartburn. She does have some issues with throat clearing and occasional hoarseness. She is not having dysphagia or odynophagia. Her appetite has been good. She does have ongoing issues with her sinuses as well as postnasal drip. These symptoms do not bother her very much and she doesn't feel that she wants to necessarily change medication at this time.  From a Crohn's perspective she continues Pentasa twice daily. She occasionally will have transient right lower quadrant abdominal discomfort but never pain. No obstructive or partial obstructive symptoms. Bowel movements are usually regular occurring once or twice per day. Stools are often formed and occasionally loose. No blood in her stool or melena.  She had a colonoscopy last year in May on 08/18/2015. This revealed a normal examined terminal ileum for about 10-15 cm. A 10 mm flat  polyp was removed from the ascending colon which was found to be a sessile serrated polyp without dysplasia. Exam was otherwise normal with no evidence of active Crohn's disease at that time.  She does have a history of migraines and follows with a headache specialist. Previously she used Imitrex but she was recently told this was no longer safe due to her age. Her headache specialist recommended proximal and when needed. She inquires about safety of naproxen with her Crohn's disease. She reports she really hasn't had migraines in multiple years but would like to have a medication should she develop 1.  She has been under a lot of stress recently. She has a daughter who lives in the [place] area who is going through a divorce. Also a granddaughter from the same family has bipolar depression and has recently become pregnant. She is 19.  Review of Systems As per history of present illness, otherwise negative  Current Medications, Allergies, Past Medical History, Past Surgical History, Family History and Social History were reviewed in [employer] record.     Objective:   Physical Exam BP (!) 142/70 (BP Location: Left Arm, Patient Position: Sitting, Cuff Size: Normal)   Pulse 88   Ht 5' 5.25" (1.657 m) Comment: height measured without shoes  Wt 143 lb (64.9 kg)   LMP 04/19/1992   BMI 23.61 kg/m  Constitutional: Well-developed and well-nourished. No distress. HEENT: Normocephalic and atraumatic. Conjunctivae are normal.  No scleral icterus. Neck: Neck supple. Trachea midline. Cardiovascular: Normal rate, regular rhythm and intact distal pulses. No M/R/G Pulmonary/chest: Effort normal and  breath sounds normal. No wheezing, rales or rhonchi. Abdominal: Soft, nontender, nondistended. Bowel sounds active throughout. There are no masses palpable. No hepatosplenomegaly. Extremities: no clubbing, cyanosis, or edema Neurological: Alert and oriented to person place and  time. Skin: Skin is warm and dry.  Psychiatric: Normal mood and affect. Behavior is normal.  CBC    Component Value Date/Time   WBC 4.8 11/25/2015 0951   RBC 4.44 11/25/2015 0951   HGB 14.1 11/25/2015 0951   HCT 41.1 11/25/2015 0951   PLT 226.0 11/25/2015 0951   MCV 92.5 11/25/2015 0951   MCHC 34.2 11/25/2015 0951   RDW 12.7 11/25/2015 0951   LYMPHSABS 1.0 11/25/2015 0951   MONOABS 0.4 11/25/2015 0951   EOSABS 0.1 11/25/2015 0951   BASOSABS 0.0 11/25/2015 0951   CMP     Component Value Date/Time   NA 139 11/25/2015 0951   K 4.3 11/25/2015 0951   CL 102 11/25/2015 0951   CO2 30 11/25/2015 0951   GLUCOSE 89 11/25/2015 0951   GLUCOSE 91 04/27/2006 1058   BUN 15 11/25/2015 0951   CREATININE 0.71 11/25/2015 0951   CALCIUM 9.7 11/25/2015 0951   PROT 7.1 11/25/2015 0951   ALBUMIN 4.4 11/25/2015 0951   AST 23 11/25/2015 0951   ALT 21 11/25/2015 0951   ALKPHOS 68 11/25/2015 0951   BILITOT 0.4 11/25/2015 0951   GFRNONAA 80.08 10/10/2009 0811   GFRAA 82 06/08/2007 0831      Assessment & Plan:  71 year old female with a past medical history of Crohn's ileitis, GERD with hiatal hernia and predominantly LPR symptoms, hyperlipidemia and migraines who is here for follow-up.  1. LPR -- Intermittent symptoms with predominantly LPR such as throat clearing and hoarseness. Upper endoscopy very reassuring no evidence of chronic inflammation or Barrett's esophagus. Symptoms are tolerable now and she is not interested in escalation of therapy or further testing such as 24-hour pH and impedance area we did discuss this today. She will continue omeprazole 40 mg 30 minutes before her last meal of the day. She is following an antireflux lifestyle already.  2. Ileal Crohn's disease -- endoscopic remission as of colonoscopy last year. Attempt to wean Pentasa in the past resulted in flare of her disease. For this reason I recommend we continue current dose of Pentasa 1.5 g twice a day. I explained to  her this is the only mesalamine product with small bowel activity and so we will continue this specific medication rather than switch to a likely more affordable option. She is happy with this plan.  3. Sessile serrated polyp -- surveillance colonoscopy recommended in 3 years which would be May 2020  25 minutes spent with the patient today. Greater than 50% was spent in counseling and coordination of care with the patient 1 year ROV

## 2016-07-02 NOTE — Patient Instructions (Signed)
Continue taking Prilosec in the afternoon  We have sent the following medications to your pharmacy for you to pick up at your convenience:  Prilosec and Pentasa  Please follow up in one year

## 2016-08-10 DIAGNOSIS — Z974 Presence of external hearing-aid: Secondary | ICD-10-CM | POA: Diagnosis not present

## 2016-08-10 DIAGNOSIS — H903 Sensorineural hearing loss, bilateral: Secondary | ICD-10-CM | POA: Diagnosis not present

## 2016-08-10 DIAGNOSIS — H6123 Impacted cerumen, bilateral: Secondary | ICD-10-CM | POA: Diagnosis not present

## 2016-08-24 DIAGNOSIS — G43019 Migraine without aura, intractable, without status migrainosus: Secondary | ICD-10-CM | POA: Diagnosis not present

## 2016-08-26 ENCOUNTER — Encounter: Payer: Self-pay | Admitting: Family Medicine

## 2016-09-17 ENCOUNTER — Other Ambulatory Visit: Payer: Self-pay | Admitting: Obstetrics & Gynecology

## 2016-09-17 NOTE — Telephone Encounter (Signed)
Medication refill request: Estradiol tabs Last AEX:  09/16/15 SM Next AEX: 12/28/16 SM Last MMG (if hormonal medication request): 11/10/15 BIRADS1, D[facility] Refill authorized: 09/16/15 #[id number]. Please advise. Thank you.

## 2016-09-19 ENCOUNTER — Other Ambulatory Visit: Payer: Self-pay | Admitting: Internal Medicine

## 2016-09-27 ENCOUNTER — Other Ambulatory Visit: Payer: Self-pay | Admitting: Internal Medicine

## 2016-09-27 DIAGNOSIS — Z1231 Encounter for screening mammogram for malignant neoplasm of breast: Secondary | ICD-10-CM

## 2016-11-02 ENCOUNTER — Other Ambulatory Visit: Payer: Self-pay | Admitting: Internal Medicine

## 2016-11-11 ENCOUNTER — Ambulatory Visit
Admission: RE | Admit: 2016-11-11 | Discharge: 2016-11-11 | Disposition: A | Discharge status: Home / Self Care | Payer: Medicare Other | Source: Ambulatory Visit | Attending: Internal Medicine | Admitting: Internal Medicine

## 2016-11-11 DIAGNOSIS — Z1231 Encounter for screening mammogram for malignant neoplasm of breast: Secondary | ICD-10-CM

## 2016-11-29 ENCOUNTER — Encounter: Payer: Self-pay | Admitting: Internal Medicine

## 2016-12-05 NOTE — Progress Notes (Signed)
No chief complaint on file.   HPI: Bridget Reyes 71 y.o. come in for Chronic disease management  Yearly visit    [place] lang[place] hearing aid   Lipids:  No se of meds  :  treis to eat healthy   Migraines:  Doing well  Bridget Reyes  As needed .   Family stress and anxiety .     Flexeril as needed shoulder neck .  Rosacea   .    On azelic acid .    Needs smaller tube  Using prn   Crohn stable  At this time   buring balls of feet about the same  No progressed uncertain if important last b12 nl  Wants refill atrovent and  Valtrex used prn.  ROS: See pertinent positives and negatives per HPI. hhof 2 exercises    Past Medical History:  Diagnosis Date  . Allergy   . Arthritis    knees, neck - no meds  . Cataract    bilateral - MD just watchning  . Colon polyps   . Crohn's   . Eczema   . Esophageal reflux   . Gastritis   . Hiatal hernia   . Hyperlipemia   . Ileitis   . Migraines    tx with naproxen - last one 4 yrs ago  . Rosacea   . Skin cancer of face    scca removed     Family History  Problem Relation Age of Onset  . Parkinsonism Mother   . Lung cancer Mother   . Heart disease Father        died chf in 50 s  . Hypertension Father   . Congestive Heart Failure Father   . Breast cancer Cousin   . Colon cancer Neg Hx   . Esophageal cancer Neg Hx   . Rectal cancer Neg Hx   . Stomach cancer Neg Hx     Social History   Social History  . Marital status: Married    Spouse name: N/A  . Number of children: 2  . Years of education: N/A   Occupational History  . retired      Education officer, [employer]  .  Unemployed   Social History Main Topics  . Smoking status: Never Smoker  . Smokeless tobacco: Never Used  . Alcohol use 1.8 - 2.4 oz/week    3 - 4 Glasses of wine per week     Comment: occ  . Drug use: No  . Sexual activity: Yes    Partners: Male    Birth control/ protection: Surgical     Comment: hysterectomy   Other Topics Concern  . None   Social  History Narrative   hh  Of 2       Dog    Married    G2P2    Outpatient Medications Prior to Visit  Medication Sig Dispense Refill  . acetaminophen (TYLENOL) 500 MG tablet Take 500 mg by mouth.    . Azelaic Acid (FINACEA) 15 % cream After skin is thoroughly washed and patted dry, gently but thoroughly massage a thin film of azelaic acid cream into the affected area twice daily, in the morning and evening. 50 g 11  . Estradiol 10 MCG TABS vaginal tablet insert 1 tablet vaginally as directed 24 tablet 1  . fluticasone (FLONASE) 50 MCG/ACT nasal spray Place 2 sprays into the nose daily. (Patient taking differently: Place 1 spray into the nose daily. ) 16 g 11  .  mesalamine (PENTASA) 500 MG CR capsule take 3 capsules by mouth twice a day 540 capsule 3  . MULTIPLE VITAMIN PO Take 1 tablet by mouth daily.     . naproxen (NAPROSYN) 500 MG tablet Take 500 mg by mouth 2 (two) times daily with a meal.    . omeprazole (PRILOSEC) 40 MG capsule Take 1 capsule (40 mg total) by mouth daily. 90 capsule 3  . ipratropium (ATROVENT) 0.06 % nasal spray instill 2 sprays into each nostril twice a day to three times a day 15 mL 2  . simvastatin (ZOCOR) 40 MG tablet Take 1 tablet (40 mg total) by mouth at bedtime. 90 tablet 3   No facility-administered medications prior to visit.      EXAM:  BP 130/70 (BP Location: Right Arm, Patient Position: Sitting, Cuff Size: Normal)   Pulse 64   Temp 97.6 F (36.4 C) (Oral)   Ht 5' 5" (1.651 m)   Wt 143 lb 14.4 oz (65.3 kg)   LMP 04/19/1992   BMI 23.95 kg/m   Body mass index is 23.95 kg/m.  GENERAL: vitals reviewed and listed above, alert, oriented, appears well hydrated and in no acute distress HEENT: atraumatic, conjunctiva  clear, no obvious abnormalities on inspection of external nose and ears OP : no lesion edema or exudate  NECK: no obvious masses on inspection palpation  LUNGS: clear to auscultation bilaterally, no wheezes, rales or rhonchi, good air  movement Abdomen:  Sof,t normal bowel sounds without hepatosplenomegaly, no guarding rebound or masses no CVA tenderness CV: HRRR, no clubbing cyanosis or  peripheral edema nl cap refill  MS: moves all extremities without noticeable focal  Abnormality fet no ulcer lesion nl cap refill  PSYCH: pleasant and cooperative, no obvious depression or anxiety Lab Results  Component Value Date   WBC 4.8 11/25/2015   HGB 14.1 11/25/2015   HCT 41.1 11/25/2015   PLT 226.0 11/25/2015   GLUCOSE 89 11/25/2015   CHOL 152 11/25/2015   TRIG 177.0 (H) 11/25/2015   HDL 40.40 11/25/2015   LDLCALC 76 11/25/2015   ALT 21 11/25/2015   AST 23 11/25/2015   NA 139 11/25/2015   K 4.3 11/25/2015   CL 102 11/25/2015   CREATININE 0.71 11/25/2015   BUN 15 11/25/2015   CO2 30 11/25/2015   TSH 2.82 11/25/2015   BP Readings from Last 3 Encounters:  12/08/16 130/70  07/02/16 (!) 142/70  06/15/16 122/62   Lab Results  Component Value Date   VITAMINB12 588 11/25/2015    ASSESSMENT AND PLAN:  Discussed the following assessment and plan:  Hyperlipidemia, unspecified hyperlipidemia type - no se of med conlsi and med check lab today - Plan: Lipid panel, Hepatic function panel, CBC with Differential/Platelet, Basic metabolic panel  Crohn's disease without complication, unspecified gastrointestinal tract location ([place]) - Plan: Lipid panel, Hepatic function panel, CBC with Differential/Platelet, Basic metabolic panel  Rosacea  Medication management - Plan: Lipid panel, Hepatic function panel, CBC with Differential/Platelet, Basic metabolic panel  Vasomotor rhinitis  Hearing aid worn  Adjustment disorder with anxiety - dsic counseling ho given strategies  Balls of feet problem most likely   Mechanical and not neuropathy last b12 good but will follow refer if progressieve needed  Total visit 34mns > 50% spent counseling and coordinating care as indicated in above note and in instructions to patient .   Counseled. Number of issues addressed  utd on [parameters   -Patient advised to return or notify health care  team  if  new concerns arise.  Patient Instructions  Will notify you  of labs when available. Let us know if progressing   Burning .   And we can  Get  Appt.  But could be a foot issues and not any disease state.   Let me know  About the  azelic acid    Sizing    For refills.    Consider counseling for anxiety strategies     Stress and Stress Management Stress is a normal reaction to life events. It is what you feel when life demands more than you are used to or more than you can handle. Some stress can be useful. For example, the stress reaction can help you catch the last bus of the day, study for a test, or meet a deadline at work. But stress that occurs too often or for too long can cause problems. It can affect your emotional health and interfere with relationships and normal daily activities. Too much stress can weaken your immune system and increase your risk for physical illness. If you already have a medical problem, stress can make it worse. What are the causes? All sorts of life events may cause stress. An event that causes stress for one person may not be stressful for another person. Major life events commonly cause stress. These may be positive or negative. Examples include losing your job, moving into a new home, getting married, having a baby, or losing a loved one. Less obvious life events may also cause stress, especially if they occur day after day or in combination. Examples include working long hours, driving in traffic, caring for children, being in debt, or being in a difficult relationship. What are the signs or symptoms? Stress may cause emotional symptoms including, the following:  Anxiety. This is feeling worried, afraid, on edge, overwhelmed, or out of control.  Anger. This is feeling irritated or impatient.  Depression. This is feeling sad, down,  helpless, or guilty.  Difficulty focusing, remembering, or making decisions.  Stress may cause physical symptoms, including the following:  Aches and pains. These may affect your head, neck, back, stomach, or other areas of your body.  Tight muscles or clenched jaw.  Low energy or trouble sleeping.  Stress may cause unhealthy behaviors, including the following:  Eating to feel better (overeating) or skipping meals.  Sleeping too little, too much, or both.  Working too much or putting off tasks (procrastination).  Smoking, drinking alcohol, or using drugs to feel better.  How is this diagnosed? Stress is diagnosed through an assessment by your health care provider. Your health care provider will ask questions about your symptoms and any stressful life events.Your health care provider will also ask about your medical history and may order blood tests or other tests. Certain medical conditions and medicine can cause physical symptoms similar to stress. Mental illness can cause emotional symptoms and unhealthy behaviors similar to stress. Your health care provider may refer you to a mental health professional for further evaluation. How is this treated? Stress management is the recommended treatment for stress.The goals of stress management are reducing stressful life events and coping with stress in healthy ways. Techniques for reducing stressful life events include the following:  Stress identification. Self-monitor for stress and identify what causes stress for you. These skills may help you to avoid some stressful events.  Time management. Set your priorities, keep a calendar of events, and learn to say "no." These tools can help  you avoid making too many commitments.  Techniques for coping with stress include the following:  Rethinking the problem. Try to think realistically about stressful events rather than ignoring them or overreacting. Try to find the positives in a stressful  situation rather than focusing on the negatives.  Exercise. Physical exercise can release both physical and emotional tension. The key is to find a form of exercise you enjoy and do it regularly.  Relaxation techniques. These relax the body and mind. Examples include yoga, meditation, tai chi, biofeedback, deep breathing, progressive muscle relaxation, listening to music, being out in nature, journaling, and other hobbies. Again, the key is to find one or more that you enjoy and can do regularly.  Healthy lifestyle. Eat a balanced diet, get plenty of sleep, and do not smoke. Avoid using alcohol or drugs to relax.  Strong support network. Spend time with family, friends, or other people you enjoy being around.Express your feelings and talk things over with someone you trust.  Counseling or talktherapy with a mental health professional may be helpful if you are having difficulty managing stress on your own. Medicine is typically not recommended for the treatment of stress.Talk to your health care provider if you think you need medicine for symptoms of stress. Follow these instructions at home:  Keep all follow-up visits as directed by your health care provider.  Take all medicines as directed by your health care provider. Contact a health care provider if:  Your symptoms get worse or you start having new symptoms.  You feel overwhelmed by your problems and can no longer manage them on your own. Get help right away if:  You feel like hurting yourself or someone else. This information is not intended to replace advice given to you by your health care provider. Make sure you discuss any questions you have with your health care provider. Document Released: 09/29/2000 Document Revised: 09/11/2015 Document Reviewed: 11/28/2012 Elsevier Interactive Patient Education  2017 [place] K. Quamere Mussell M.D.

## 2016-12-08 ENCOUNTER — Encounter: Payer: Self-pay | Admitting: Internal Medicine

## 2016-12-08 ENCOUNTER — Ambulatory Visit (INDEPENDENT_AMBULATORY_CARE_PROVIDER_SITE_OTHER): Payer: Medicare Other | Admitting: Internal Medicine

## 2016-12-08 VITALS — BP 130/70 | HR 64 | Temp 97.6°F | Ht 65.0 in | Wt 143.9 lb

## 2016-12-08 DIAGNOSIS — F4322 Adjustment disorder with anxiety: Secondary | ICD-10-CM

## 2016-12-08 DIAGNOSIS — Z974 Presence of external hearing-aid: Secondary | ICD-10-CM | POA: Diagnosis not present

## 2016-12-08 DIAGNOSIS — Z79899 Other long term (current) drug therapy: Secondary | ICD-10-CM

## 2016-12-08 DIAGNOSIS — L719 Rosacea, unspecified: Secondary | ICD-10-CM | POA: Diagnosis not present

## 2016-12-08 DIAGNOSIS — J300 Vasomotor rhinitis: Secondary | ICD-10-CM

## 2016-12-08 DIAGNOSIS — K5090 Crohn's disease, unspecified, without complications: Secondary | ICD-10-CM | POA: Diagnosis not present

## 2016-12-08 DIAGNOSIS — E785 Hyperlipidemia, unspecified: Secondary | ICD-10-CM | POA: Diagnosis not present

## 2016-12-08 LAB — BASIC METABOLIC PANEL
BUN: 14 mg/dL (ref 6–23)
CHLORIDE: 100 meq/L (ref 96–112)
CO2: 32 meq/L (ref 19–32)
CREATININE: 0.8 mg/dL (ref 0.40–1.20)
Calcium: 9.9 mg/dL (ref 8.4–10.5)
GFR: 75.01 mL/min (ref >60.00)
Glucose, Bld: 103 mg/dL — ABNORMAL HIGH (ref 70–99)
Potassium: 4.2 mEq/L (ref 3.5–5.1)
Sodium: 138 mEq/L (ref 135–145)

## 2016-12-08 LAB — CBC WITH DIFFERENTIAL/PLATELET
BASOS ABS: 0.1 10*3/uL (ref 0.0–0.1)
Basophils Relative: 0.9 % (ref 0.0–3.0)
Eosinophils Absolute: 0.2 10*3/uL (ref 0.0–0.7)
Eosinophils Relative: 3 % (ref 0.0–5.0)
HEMATOCRIT: 42.6 % (ref 36.0–46.0)
Hemoglobin: 14.4 g/dL (ref 12.0–15.0)
LYMPHS PCT: 24.6 % (ref 12.0–46.0)
Lymphs Abs: 1.5 10*3/uL (ref 0.7–4.0)
MCHC: 33.8 g/dL (ref 30.0–36.0)
MCV: 94.1 fl (ref 78.0–100.0)
MONOS PCT: 9.2 % (ref 3.0–12.0)
Monocytes Absolute: 0.5 10*3/uL (ref 0.1–1.0)
NEUTROS ABS: 3.7 10*3/uL (ref 1.4–7.7)
Neutrophils Relative %: 62.3 % (ref 43.0–77.0)
PLATELETS: 281 10*3/uL (ref 150.0–400.0)
RBC: 4.53 Mil/uL (ref 3.87–5.11)
RDW: 12.5 % (ref 11.5–15.5)
WBC: 5.9 10*3/uL (ref 4.0–10.5)

## 2016-12-08 LAB — HEPATIC FUNCTION PANEL
ALT: 22 U/L (ref 0–35)
AST: 21 U/L (ref 0–37)
Albumin: 4.6 g/dL (ref 3.5–5.2)
Alkaline Phosphatase: 77 U/L (ref 39–117)
Bilirubin, Direct: 0.1 mg/dL (ref 0.0–0.3)
Total Bilirubin: 0.5 mg/dL (ref 0.2–1.2)
Total Protein: 7.5 g/dL (ref 6.0–8.3)

## 2016-12-08 LAB — LIPID PANEL
Cholesterol: 161 mg/dL (ref 0–200)
HDL: 41.6 mg/dL (ref >39.00)
LDL Cholesterol: 98 mg/dL (ref 0–99)
NONHDL: 119.79
Total CHOL/HDL Ratio: 4
Triglycerides: 111 mg/dL (ref 0.0–149.0)
VLDL: 22.2 mg/dL (ref 0.0–40.0)

## 2016-12-08 MED ORDER — IPRATROPIUM BROMIDE 0.06 % NA SOLN: NASAL | 99 refills | Status: DC

## 2016-12-08 MED ORDER — VALACYCLOVIR HCL 1 G PO TABS: 1000.0000 mg | ORAL_TABLET | Freq: Every day | ORAL | 11 refills | Status: DC | PRN

## 2016-12-08 MED ORDER — SIMVASTATIN 40 MG PO TABS: 40.0000 mg | ORAL_TABLET | Freq: Every day | ORAL | 3 refills | Status: DC

## 2016-12-08 NOTE — Patient Instructions (Addendum)
Will notify you  of labs when available. Let us know if progressing   Burning .   And we can  Get  Appt.  But could be a foot issues and not any disease state.   Let me know  About the  azelic acid    Sizing    For refills.    Consider counseling for anxiety strategies     Stress and Stress Management Stress is a normal reaction to life events. It is what you feel when life demands more than you are used to or more than you can handle. Some stress can be useful. For example, the stress reaction can help you catch the last bus of the day, study for a test, or meet a deadline at work. But stress that occurs too often or for too long can cause problems. It can affect your emotional health and interfere with relationships and normal daily activities. Too much stress can weaken your immune system and increase your risk for physical illness. If you already have a medical problem, stress can make it worse. What are the causes? All sorts of life events may cause stress. An event that causes stress for one person may not be stressful for another person. Major life events commonly cause stress. These may be positive or negative. Examples include losing your job, moving into a new home, getting married, having a baby, or losing a loved one. Less obvious life events may also cause stress, especially if they occur day after day or in combination. Examples include working long hours, driving in traffic, caring for children, being in debt, or being in a difficult relationship. What are the signs or symptoms? Stress may cause emotional symptoms including, the following:  Anxiety. This is feeling worried, afraid, on edge, overwhelmed, or out of control.  Anger. This is feeling irritated or impatient.  Depression. This is feeling sad, down, helpless, or guilty.  Difficulty focusing, remembering, or making decisions.  Stress may cause physical symptoms, including the following:  Aches and pains. These may  affect your head, neck, back, stomach, or other areas of your body.  Tight muscles or clenched jaw.  Low energy or trouble sleeping.  Stress may cause unhealthy behaviors, including the following:  Eating to feel better (overeating) or skipping meals.  Sleeping too little, too much, or both.  Working too much or putting off tasks (procrastination).  Smoking, drinking alcohol, or using drugs to feel better.  How is this diagnosed? Stress is diagnosed through an assessment by your health care provider. Your health care provider will ask questions about your symptoms and any stressful life events.Your health care provider will also ask about your medical history and may order blood tests or other tests. Certain medical conditions and medicine can cause physical symptoms similar to stress. Mental illness can cause emotional symptoms and unhealthy behaviors similar to stress. Your health care provider may refer you to a mental health professional for further evaluation. How is this treated? Stress management is the recommended treatment for stress.The goals of stress management are reducing stressful life events and coping with stress in healthy ways. Techniques for reducing stressful life events include the following:  Stress identification. Self-monitor for stress and identify what causes stress for you. These skills may help you to avoid some stressful events.  Time management. Set your priorities, keep a calendar of events, and learn to say "no." These tools can help you avoid making too many commitments.  Techniques for coping with  stress include the following:  Rethinking the problem. Try to think realistically about stressful events rather than ignoring them or overreacting. Try to find the positives in a stressful situation rather than focusing on the negatives.  Exercise. Physical exercise can release both physical and emotional tension. The key is to find a form of exercise you  enjoy and do it regularly.  Relaxation techniques. These relax the body and mind. Examples include yoga, meditation, tai chi, biofeedback, deep breathing, progressive muscle relaxation, listening to music, being out in nature, journaling, and other hobbies. Again, the key is to find one or more that you enjoy and can do regularly.  Healthy lifestyle. Eat a balanced diet, get plenty of sleep, and do not smoke. Avoid using alcohol or drugs to relax.  Strong support network. Spend time with family, friends, or other people you enjoy being around.Express your feelings and talk things over with someone you trust.  Counseling or talktherapy with a mental health professional may be helpful if you are having difficulty managing stress on your own. Medicine is typically not recommended for the treatment of stress.Talk to your health care provider if you think you need medicine for symptoms of stress. Follow these instructions at home:  Keep all follow-up visits as directed by your health care provider.  Take all medicines as directed by your health care provider. Contact a health care provider if:  Your symptoms get worse or you start having new symptoms.  You feel overwhelmed by your problems and can no longer manage them on your own. Get help right away if:  You feel like hurting yourself or someone else. This information is not intended to replace advice given to you by your health care provider. Make sure you discuss any questions you have with your health care provider. Document Released: 09/29/2000 Document Revised: 09/11/2015 Document Reviewed: 11/28/2012 Elsevier Interactive Patient Education  2017 [employer].

## 2016-12-14 ENCOUNTER — Telehealth: Payer: Self-pay | Admitting: Internal Medicine

## 2016-12-14 NOTE — Telephone Encounter (Signed)
Dr. Regis Bill received a fax from this pt's pharmacy requesting an rx for Finacea gel 50g tube. Dr. Regis Bill signed form and wrote ok prn. X 1 year. This was faxed to the pharmacy. Nothing further is needed

## 2016-12-28 ENCOUNTER — Other Ambulatory Visit (HOSPITAL_COMMUNITY)
Admission: RE | Admit: 2016-12-28 | Discharge: 2016-12-28 | Disposition: A | Discharge status: Home / Self Care | Payer: Medicare Other | Source: Ambulatory Visit | Attending: Obstetrics & Gynecology | Admitting: Obstetrics & Gynecology

## 2016-12-28 ENCOUNTER — Ambulatory Visit (INDEPENDENT_AMBULATORY_CARE_PROVIDER_SITE_OTHER): Payer: Medicare Other | Admitting: Obstetrics & Gynecology

## 2016-12-28 ENCOUNTER — Encounter: Payer: Self-pay | Admitting: Obstetrics & Gynecology

## 2016-12-28 VITALS — BP 136/62 | HR 64 | Resp 12 | Ht 65.5 in | Wt 145.0 lb

## 2016-12-28 DIAGNOSIS — R7309 Other abnormal glucose: Secondary | ICD-10-CM | POA: Diagnosis not present

## 2016-12-28 DIAGNOSIS — Z124 Encounter for screening for malignant neoplasm of cervix: Secondary | ICD-10-CM | POA: Diagnosis not present

## 2016-12-28 DIAGNOSIS — Z01419 Encounter for gynecological examination (general) (routine) without abnormal findings: Secondary | ICD-10-CM

## 2016-12-28 NOTE — Progress Notes (Signed)
71 y.o. G2P2 MarriedCaucasianF here for annual exam.  Doing well.  No vaginal bleeding.    Patient's last menstrual period was 04/19/1992.          Sexually active: Yes.    The current method of family planning is status post hysterectomy.   Exercising: Yes.    curves gym, walking Smoker:  no  Health Maintenance: Pap:  07/09/13 negative  History of abnormal Pap:  no MMG:  11/12/16 BIRADS 1 negative  Colonoscopy:  08/18/15 polyps- repeat 3 years. Dr. Hilarie Fredrickson BMD:   11/05/14 normal  TDaP:  11/13/13  Pneumonia vaccine(s):  11/01/11, 11/13/13  Zostavax:   10/29/09, shingrix done this year Hep C testing: 07/07/15 negative  Screening Labs: PCP, Hb today: PCP   reports that she has never smoked. She has never used smokeless tobacco. She reports that she drinks about 1.8 - 2.4 oz of alcohol per week . She reports that she does not use drugs.  Past Medical History:  Diagnosis Date  . Allergy   . Arthritis    knees, neck - no meds  . Cataract    bilateral - MD just watchning  . Colon polyps   . Crohn's   . Eczema   . Esophageal reflux   . Gastritis   . Hiatal hernia   . Hyperlipemia   . Ileitis   . Migraines    tx with naproxen - last one 4 yrs ago  . Rosacea   . Skin cancer of face    scca removed     Past Surgical History:  Procedure Laterality Date  . BREAST BIOPSY Left 1980  . COLONOSCOPY  08/2015   Pyrtle -polyp  . TONSILLECTOMY    . TOTAL VAGINAL HYSTERECTOMY  1994  . UPPER GI ENDOSCOPY     hiatal hernia  . WISDOM TOOTH EXTRACTION      Current Outpatient Prescriptions  Medication Sig Dispense Refill  . acetaminophen (TYLENOL) 500 MG tablet Take 500 mg by mouth.    . Azelaic Acid (FINACEA) 15 % cream After skin is thoroughly washed and patted dry, gently but thoroughly massage a thin film of azelaic acid cream into the affected area twice daily, in the morning and evening. 50 g 11  . Estradiol 10 MCG TABS vaginal tablet insert 1 tablet vaginally as directed 24 tablet 1  .  fluticasone (FLONASE) 50 MCG/ACT nasal spray Place 2 sprays into the nose daily. (Patient taking differently: Place 1 spray into the nose daily. ) 16 g 11  . ipratropium (ATROVENT) 0.06 % nasal spray instill 2 sprays into each nostril twice a day to three times a day 15 mL prn  . mesalamine (PENTASA) 500 MG CR capsule take 3 capsules by mouth twice a day 540 capsule 3  . MULTIPLE VITAMIN PO Take 1 tablet by mouth daily.     . naproxen (NAPROSYN) 500 MG tablet Take 500 mg by mouth 2 (two) times daily with a meal.    . omeprazole (PRILOSEC) 40 MG capsule Take 1 capsule (40 mg total) by mouth daily. 90 capsule 3  . SHINGRIX injection INJECT 0.5 MILLILITER INTRAMUSCULARLY  0  . simvastatin (ZOCOR) 40 MG tablet Take 1 tablet (40 mg total) by mouth at bedtime. 90 tablet 3  . valACYclovir (VALTREX) 1000 MG tablet Take 1 tablet (1,000 mg total) by mouth daily as needed. 30 tablet 11   No current facility-administered medications for this visit.     Family History  Problem Relation Age  of Onset  . Parkinsonism Mother   . Lung cancer Mother   . Heart disease Father        died chf in 33 s  . Hypertension Father   . Congestive Heart Failure Father   . Breast cancer Cousin   . Colon cancer Neg Hx   . Esophageal cancer Neg Hx   . Rectal cancer Neg Hx   . Stomach cancer Neg Hx     ROS:  Pertinent items are noted in HPI.  Otherwise, a comprehensive ROS was negative.  Exam:   BP 136/62 (BP Location: Right Arm, Patient Position: Sitting, Cuff Size: Normal)   Pulse 64   Resp 12   Ht 5' 5.5" (1.664 m)   Wt 145 lb (65.8 kg)   LMP 04/19/1992   BMI 23.76 kg/m    Height: 5' 5.5" (166.4 cm)  Ht Readings from Last 3 Encounters:  12/28/16 5' 5.5" (1.664 m)  12/08/16 5\' 5"  (1.651 m)  07/02/16 5' 5.25" (1.657 m)    General appearance: alert, cooperative and appears stated age Head: Normocephalic, without obvious abnormality, atraumatic Neck: no adenopathy, supple, symmetrical, trachea midline and  thyroid normal to inspection and palpation Lungs: clear to auscultation bilaterally Breasts: normal appearance, no masses or tenderness Heart: regular rate and rhythm Abdomen: soft, non-tender; bowel sounds normal; no masses,  no organomegaly Extremities: extremities normal, atraumatic, no cyanosis or edema Skin: Skin color, texture, turgor normal. No rashes or lesions Lymph nodes: Cervical, supraclavicular, and axillary nodes normal. No abnormal inguinal nodes palpated Neurologic: Grossly normal   Pelvic: External genitalia:  no lesions              Urethra:  normal appearing urethra with no masses, tenderness or lesions              Bartholins and Skenes: normal                 Vagina: are of posterior granulation tissue present, bled easily so pap obtained              Cervix: absent              Pap taken: yes Bimanual Exam:  Uterus:  uterus absent              Adnexa: normal adnexa and no mass, fullness, tenderness               Rectovaginal: Confirms               Anus:  normal sphincter tone, no lesions  Chaperone was present for exam.  A:  Well Woman with normal exam PMP H/O TVH H/O vaginal ulceration/granulation tissues after Estring use.  Treated with Josph [place]o Touch in 2016. Vaginal atrophic changes H/O Crohn's ileitis--follow by Dr. Hilarie Fredrickson Mildly elevated glucose Vaginal bleeding/granlation tissue  P:   Mammogram yearly.  Guidelines reviewed. pap smear obtained today.  Recheck 10 days. Rx for Vagifem 85meq pv twice weekly.  #8/12 RF. Checking HbA1C return annually or prn

## 2016-12-29 LAB — HEMOGLOBIN A1C
ESTIMATED AVERAGE GLUCOSE: 105 mg/dL
Hgb A1c MFr Bld: 5.3 % (ref 4.8–5.6)

## 2016-12-30 LAB — CYTOLOGY - PAP: Diagnosis: NEGATIVE

## 2017-01-06 ENCOUNTER — Telehealth: Payer: Self-pay | Admitting: Internal Medicine

## 2017-01-06 ENCOUNTER — Encounter: Payer: Self-pay | Admitting: Obstetrics & Gynecology

## 2017-01-06 ENCOUNTER — Ambulatory Visit (INDEPENDENT_AMBULATORY_CARE_PROVIDER_SITE_OTHER): Payer: Medicare Other | Admitting: Obstetrics & Gynecology

## 2017-01-06 VITALS — BP 124/62 | HR 70 | Resp 14 | Wt 143.5 lb

## 2017-01-06 DIAGNOSIS — N898 Other specified noninflammatory disorders of vagina: Secondary | ICD-10-CM | POA: Diagnosis not present

## 2017-01-06 MED ORDER — ESTRADIOL 10 MCG VA TABS: ORAL_TABLET | VAGINAL | 4 refills | Status: DC

## 2017-01-06 MED ORDER — AZELAIC ACID 15 % EX GEL: CUTANEOUS | 11 refills | Status: DC

## 2017-01-06 NOTE — Telephone Encounter (Signed)
LM x 1 to make patient aware that this has been taken care of already   Marin Roberts, [place]  12/14/16 8:05 AM   Note Dr. Regis Bill received a fax from this pt's pharmacy requesting an rx for Finacea gel 50g tube. Dr. Regis Bill signed form and wrote ok prn. X 1 year. This was faxed to the pharmacy. Nothing further is needed

## 2017-01-06 NOTE — Telephone Encounter (Signed)
Pt states Dr Regis Bill was going to call in a Rx FINACEA  But pt was to check on the size. Pt states it only comes in 50 g  Would like this called in to  [place], [place] AT [address]

## 2017-01-06 NOTE — Progress Notes (Signed)
GYNECOLOGY  VISIT  CC:   Treatment for vaginal granulation tissue  HPI: 71 y.o. G2P2 Married Caucasian female here for topical treatment of recurrent vaginal granulation tissue.  This started a few years ago with an Estring.  Tissue was biopsied showing granulation tissue.  Pt had topical treatment with silver nitrate but ultimately had the [facility] Touch performed which resolved the issue for about two years.  This was noted again with her physical exam in the same location as it was initially.  The area is smaller than originally.  Biopsy was not done but a pap smear including the area was performed.  This was negative.  She is here for treatment of the area with silver nitrate.  Denies vaginal bleeding or discharge  GYNECOLOGIC HISTORY: Patient's last menstrual period was 04/19/1992. Contraception: hysterectomy Menopausal hormone therapy: Vagifem  Patient Active Problem List   Diagnosis Date Noted  . Sensorineural hearing loss (SNHL), bilateral 12/02/2015  . Rosacea   . Hearing aid worn 11/09/2012  . Allergic urticaria 07/19/2012  . Nonmelanoma skin cancer 07/06/2012  . Medicare annual wellness visit, subsequent 11/07/2011  . Skin lesion 11/07/2011  . Palpitations 06/26/2011  . Shoulder pain, right 06/12/2011  . Elevated blood pressure reading 06/12/2011  . Leg cramps 10/30/2010  . Crohn's disease of both small and large intestine without complication ([place]) 31/54/0086  . Arthritis associated with inflammatory bowel disease 10/06/2010  . TINNITUS 10/29/2009  . HEADACHE 10/19/2008  . SLEEP DISORDER/DISTURBANCE 10/14/2008  . HIATAL HERNIA 07/22/2008  . HYPERLIPIDEMIA 07/05/2007  . MIGRAINE HEADACHE 07/05/2007  . ALLERGIC RHINITIS 07/05/2007  . GERD 07/05/2007  . 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 ECZEMA, HANDS 07/05/2007    Past Medical History:  Diagnosis Date  . Allergy   . Arthritis    knees, neck - no meds  . Cataract    bilateral - MD just watchning  . Colon  polyps   . Crohn's   . Eczema   . Esophageal reflux   . Gastritis   . Hiatal hernia   . Hyperlipemia   . Ileitis   . Migraines    tx with naproxen - last one 4 yrs ago  . Rosacea   . Skin cancer of face    scca removed     Past Surgical History:  Procedure Laterality Date  . BREAST BIOPSY Left 1980  . COLONOSCOPY  08/2015   Pyrtle -polyp  . TONSILLECTOMY    . TOTAL VAGINAL HYSTERECTOMY  1994  . UPPER GI ENDOSCOPY     hiatal hernia  . WISDOM TOOTH EXTRACTION      MEDS:   Current Outpatient Prescriptions on File Prior to Visit  Medication Sig Dispense Refill  . acetaminophen (TYLENOL) 500 MG tablet Take 500 mg by mouth.    . fluticasone (FLONASE) 50 MCG/ACT nasal spray Place 2 sprays into the nose daily. (Patient taking differently: Place 1 spray into the nose daily. ) 16 g 11  . ipratropium (ATROVENT) 0.06 % nasal spray instill 2 sprays into each nostril twice a day to three times a day 15 mL prn  . mesalamine (PENTASA) 500 MG CR capsule take 3 capsules by mouth twice a day 540 capsule 3  . MULTIPLE VITAMIN PO Take 1 tablet by mouth daily.     . naproxen (NAPROSYN) 500 MG tablet Take 500 mg by mouth 2 (two) times daily with a meal.    . omeprazole (PRILOSEC) 40 MG capsule Take 1 capsule (40 mg total)  by mouth daily. 90 capsule 3  . simvastatin (ZOCOR) 40 MG tablet Take 1 tablet (40 mg total) by mouth at bedtime. 90 tablet 3  . valACYclovir (VALTREX) 1000 MG tablet Take 1 tablet (1,000 mg total) by mouth daily as needed. 30 tablet 11   No current facility-administered medications on file prior to visit.     ALLERGIES: Sulfamethoxazole-trimethoprim; Amoxicillin; Bactrim [sulfamethoxazole-trimethoprim]; Lidocaine; Penicillins; Sertraline; Tetracaine; Topamax [topiramate]; and Remeron [mirtazapine]  Family History  Problem Relation Age of Onset  . Parkinsonism Mother   . Lung cancer Mother   . Heart disease Father        died chf in 48 s  . Hypertension Father   .  Congestive Heart Failure Father   . Breast cancer Cousin   . Colon cancer Neg Hx   . Esophageal cancer Neg Hx   . Rectal cancer Neg Hx   . Stomach cancer Neg Hx     SH:  Married, non smoker  Review of Systems  All other systems reviewed and are negative.   PHYSICAL EXAMINATION:    BP 124/62 (BP Location: Right Arm, Patient Position: Sitting, Cuff Size: Normal)   Pulse 70   Resp 14   Wt 143 lb 8 oz (65.1 kg)   LMP 04/19/1992   BMI 23.52 kg/m     General appearance: alert, cooperative and appears stated age Lymph:  No inguinal LAD  Pelvic: External genitalia:  no lesions              Urethra:  normal appearing urethra with no masses, tenderness or lesions              Bartholins and Skenes: normal                 Vagina: atrophic appearing vaginal mucosa with area of beefy red granulation tissue on the posterior side pf the vagina about mid position.  This area was treated with topical silver nitrate until the entire area was completely treated.  Pt tolerated this well.              Cervix: absent              Anus:  no lesions  Chaperone was present for exam.  Assessment: Vaginal granulation tissue s/p topical cauterization with silver nitrate  Plan: Return in one month May continue vagifem 74meq pv twice weekly.  #8/12 RF to pharmacy Pt also needs RF for Finacea 15% cream.  This was sent to pharmacy as well.

## 2017-01-07 ENCOUNTER — Encounter: Payer: Self-pay | Admitting: Internal Medicine

## 2017-01-18 DIAGNOSIS — Z23 Encounter for immunization: Secondary | ICD-10-CM | POA: Diagnosis not present

## 2017-02-01 ENCOUNTER — Encounter: Payer: Self-pay | Admitting: Obstetrics & Gynecology

## 2017-02-01 ENCOUNTER — Ambulatory Visit (INDEPENDENT_AMBULATORY_CARE_PROVIDER_SITE_OTHER): Payer: Medicare Other | Admitting: Obstetrics & Gynecology

## 2017-02-01 VITALS — BP 144/64 | HR 66 | Resp 14 | Wt 146.5 lb

## 2017-02-01 DIAGNOSIS — N898 Other specified noninflammatory disorders of vagina: Secondary | ICD-10-CM

## 2017-02-01 NOTE — Progress Notes (Signed)
GYNECOLOGY  VISIT   CC:   Recheck vaginal granulation tissue  HPI: 71 y.o. G2P2 Married Caucasian female here for recheck of granulation tissue that was present on prior exam.  She has been using vaginal estradiol tablets.  Does not really like the way she feels for a few hours after placing the estradiol.  Has some vaginal stinging and discomfort.  Would really like to stop using this.  Denies vaginal bleeding or discharge.  GYNECOLOGIC HISTORY: Patient's last menstrual period was 04/19/1992. Contraception: PMP Menopausal hormone therapy: vaginal estradiol tablet  Patient Active Problem List   Diagnosis Date Noted  . Sensorineural hearing loss (SNHL), bilateral 12/02/2015  . Rosacea   . Hearing aid worn 11/09/2012  . Allergic urticaria 07/19/2012  . Nonmelanoma skin cancer 07/06/2012  . Medicare annual wellness visit, subsequent 11/07/2011  . Skin lesion 11/07/2011  . Palpitations 06/26/2011  . Shoulder pain, right 06/12/2011  . Elevated blood pressure reading 06/12/2011  . Leg cramps 10/30/2010  . Crohn's disease of both small and large intestine without complication ([place]) 22/97/9892  . Arthritis associated with inflammatory bowel disease 10/06/2010  . TINNITUS 10/29/2009  . SLEEP DISORDER/DISTURBANCE 10/14/2008  . HIATAL HERNIA 07/22/2008  . HYPERLIPIDEMIA 07/05/2007  . MIGRAINE HEADACHE 07/05/2007  . GERD 07/05/2007  . 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 ECZEMA, HANDS 07/05/2007    Past Medical History:  Diagnosis Date  . Allergy   . Arthritis    knees, neck - no meds  . Cataract    bilateral - MD just watchning  . Colon polyps   . Crohn's   . Eczema   . Esophageal reflux   . Gastritis   . Hiatal hernia   . Hyperlipemia   . Ileitis   . Migraines    tx with naproxen - last one 4 yrs ago  . Rosacea   . Skin cancer of face    scca removed     Past Surgical History:  Procedure Laterality Date  . BREAST BIOPSY Left 1980  . COLONOSCOPY  08/2015   Pyrtle  -polyp  . TONSILLECTOMY    . TOTAL VAGINAL HYSTERECTOMY  1994  . UPPER GI ENDOSCOPY     hiatal hernia  . WISDOM TOOTH EXTRACTION      MEDS:   Current Outpatient Prescriptions on File Prior to Visit  Medication Sig Dispense Refill  . acetaminophen (TYLENOL) 500 MG tablet Take 500 mg by mouth.    . Azelaic Acid (FINACEA) 15 % cream After skin is thoroughly washed and patted dry, gently but thoroughly massage a thin film of azelaic acid cream into the affected area twice daily, in the morning and evening. 50 g 11  . Estradiol 10 MCG TABS vaginal tablet insert 1 tablet vaginally as directed 24 tablet 4  . fluticasone (FLONASE) 50 MCG/ACT nasal spray Place 2 sprays into the nose daily. (Patient taking differently: Place 1 spray into the nose daily. ) 16 g 11  . ipratropium (ATROVENT) 0.06 % nasal spray instill 2 sprays into each nostril twice a day to three times a day 15 mL prn  . mesalamine (PENTASA) 500 MG CR capsule take 3 capsules by mouth twice a day 540 capsule 3  . MULTIPLE VITAMIN PO Take 1 tablet by mouth daily.     . naproxen (NAPROSYN) 500 MG tablet Take 500 mg by mouth 2 (two) times daily with a meal.    . omeprazole (PRILOSEC) 40 MG capsule Take 1 capsule (40 mg total) by  mouth daily. 90 capsule 3  . simvastatin (ZOCOR) 40 MG tablet Take 1 tablet (40 mg total) by mouth at bedtime. 90 tablet 3  . valACYclovir (VALTREX) 1000 MG tablet Take 1 tablet (1,000 mg total) by mouth daily as needed. 30 tablet 11   No current facility-administered medications on file prior to visit.     ALLERGIES: Sulfamethoxazole-trimethoprim; Amoxicillin; Bactrim [sulfamethoxazole-trimethoprim]; Lidocaine; Penicillins; Sertraline; Tetracaine; Topamax [topiramate]; and Remeron [mirtazapine]  Family History  Problem Relation Age of Onset  . Parkinsonism Mother   . Lung cancer Mother   . Heart disease Father        died chf in 23 s  . Hypertension Father   . Congestive Heart Failure Father   . Breast  cancer Cousin   . Colon cancer Neg Hx   . Esophageal cancer Neg Hx   . Rectal cancer Neg Hx   . Stomach cancer Neg Hx     SH:  Married, non smoker  Review of Systems  All other systems reviewed and are negative.   PHYSICAL EXAMINATION:    BP (!) 144/64 (BP Location: Right Arm, Patient Position: Sitting, Cuff Size: Normal)   Pulse 66   Resp 14   Wt 146 lb 8 oz (66.5 kg)   LMP 04/19/1992   BMI 24.01 kg/m     General appearance: alert, cooperative and appears stated age  Pelvic: External genitalia:  no lesions              Urethra:  normal appearing urethra with no masses, tenderness or lesions              Bartholins and Skenes: normal                 Vagina: normal appearing vagina with normal color and discharge, no lesions, no granulation tissue noted              Cervix: absent              Bimanual Exam:  Uterus:  uterus absent              Anus:  no lesions  Chaperone was present for exam.  Assessment: Vaginal granulation tissue treated with silver nitrate 01/06/17.  Now resolved.  Plan: Pt will stop estradiol tablets and will return if has any bleeding or for AEX

## 2017-04-26 DIAGNOSIS — D2261 Melanocytic nevi of right upper limb, including shoulder: Secondary | ICD-10-CM | POA: Diagnosis not present

## 2017-04-26 DIAGNOSIS — D235 Other benign neoplasm of skin of trunk: Secondary | ICD-10-CM | POA: Diagnosis not present

## 2017-04-26 DIAGNOSIS — D2272 Melanocytic nevi of left lower limb, including hip: Secondary | ICD-10-CM | POA: Diagnosis not present

## 2017-04-26 DIAGNOSIS — D2271 Melanocytic nevi of right lower limb, including hip: Secondary | ICD-10-CM | POA: Diagnosis not present

## 2017-04-26 DIAGNOSIS — L821 Other seborrheic keratosis: Secondary | ICD-10-CM | POA: Diagnosis not present

## 2017-04-26 DIAGNOSIS — D2262 Melanocytic nevi of left upper limb, including shoulder: Secondary | ICD-10-CM | POA: Diagnosis not present

## 2017-05-17 DIAGNOSIS — T161XXA Foreign body in right ear, initial encounter: Secondary | ICD-10-CM | POA: Diagnosis not present

## 2017-05-17 DIAGNOSIS — H903 Sensorineural hearing loss, bilateral: Secondary | ICD-10-CM | POA: Diagnosis not present

## 2017-05-23 DIAGNOSIS — H903 Sensorineural hearing loss, bilateral: Secondary | ICD-10-CM | POA: Diagnosis not present

## 2017-06-03 DIAGNOSIS — H5203 Hypermetropia, bilateral: Secondary | ICD-10-CM | POA: Diagnosis not present

## 2017-06-03 DIAGNOSIS — H43813 Vitreous degeneration, bilateral: Secondary | ICD-10-CM | POA: Diagnosis not present

## 2017-06-03 DIAGNOSIS — H524 Presbyopia: Secondary | ICD-10-CM | POA: Diagnosis not present

## 2017-06-03 DIAGNOSIS — H25813 Combined forms of age-related cataract, bilateral: Secondary | ICD-10-CM | POA: Diagnosis not present

## 2017-06-03 DIAGNOSIS — H52223 Regular astigmatism, bilateral: Secondary | ICD-10-CM | POA: Diagnosis not present

## 2017-07-04 ENCOUNTER — Other Ambulatory Visit: Payer: Self-pay | Admitting: Internal Medicine

## 2017-07-05 ENCOUNTER — Other Ambulatory Visit: Payer: Self-pay | Admitting: Internal Medicine

## 2017-07-05 DIAGNOSIS — K219 Gastro-esophageal reflux disease without esophagitis: Secondary | ICD-10-CM

## 2017-07-12 ENCOUNTER — Encounter: Payer: Self-pay | Admitting: Internal Medicine

## 2017-07-12 ENCOUNTER — Ambulatory Visit (INDEPENDENT_AMBULATORY_CARE_PROVIDER_SITE_OTHER): Payer: Medicare Other | Admitting: Internal Medicine

## 2017-07-12 VITALS — BP 124/68 | HR 80 | Ht 65.25 in | Wt 150.5 lb

## 2017-07-12 DIAGNOSIS — K5000 Crohn's disease of small intestine without complications: Secondary | ICD-10-CM | POA: Diagnosis not present

## 2017-07-12 DIAGNOSIS — Z860100 Personal history of colon polyps, unspecified: Secondary | ICD-10-CM

## 2017-07-12 DIAGNOSIS — K219 Gastro-esophageal reflux disease without esophagitis: Secondary | ICD-10-CM

## 2017-07-12 MED ORDER — MESALAMINE ER 500 MG PO CPCR: ORAL_CAPSULE | ORAL | 3 refills | Status: DC

## 2017-07-12 MED ORDER — OMEPRAZOLE 40 MG PO CPDR: 40.0000 mg | DELAYED_RELEASE_CAPSULE | Freq: Every day | ORAL | 3 refills | Status: DC

## 2017-07-12 NOTE — Patient Instructions (Signed)
We have sent the following medications to your pharmacy for you to pick up at your convenience: Pentasa 1.5 grams twice daily Prilosec 40 grams once daily  Please follow up with Dr Hilarie Fredrickson in 1 year.  If you are age 73 or older, your body mass index should be between 23-30. Your Body mass index is 24.85 kg/m. If this is out of the aforementioned range listed, please consider follow up with your Primary Care Provider.  If you are age 11 or younger, your body mass index should be between 19-25. Your Body mass index is 24.85 kg/m. If this is out of the aformentioned range listed, please consider follow up with your Primary Care Provider.

## 2017-07-12 NOTE — Progress Notes (Signed)
Subjective:    Patient ID: Bridget Reyes, female    DOB: 05-03-1945, 72 y.o.   MRN: [id number]  HPI Bridget Reyes is a 72 year old female with a history of Crohn's ileitis, GERD with hiatal hernia with predominant LPR symptoms, hyperlipidemia and migraines who is here for follow-up.  She was last seen on 07/02/2016.  She reports she has had a very good year from a GI perspective.  Her GERD, though more specifically LPR symptoms such as throat clearing and cough with hoarseness remains well controlled with omeprazole.  She is taking 40 mg daily and denies side effect.  No trouble swallowing, nausea, vomiting.  Good appetite.  Stable weight.  She reports normal bowel habits and has not had any recurrent abdominal pain.  She continues Pentasa 1.5 g twice daily.  When this was weaned in the past she had a flare of her Crohn's pain.  Headaches have also not been a recent issue.  Her granddaughter ended up having a healthy baby girl about 9 months ago.  The situation seems a little better for her and her family.  There has been less stress with this issue in the past few months.   Review of Systems As per HPI, otherwise negative  Current Medications, Allergies, Past Medical History, Past Surgical History, Family History and Social History were reviewed in [employer] record.     Objective:   Physical Exam BP 124/68   Pulse 80   Ht 5' 5.25" (1.657 m)   Wt 150 lb 8 oz (68.3 kg)   LMP 04/19/1992   BMI 24.85 kg/m  Constitutional: Well-developed and well-nourished. No distress. HEENT: Normocephalic and atraumatic. Oropharynx is clear and moist. Conjunctivae are normal.  No scleral icterus. Neck: Neck supple. Trachea midline. Cardiovascular: Normal rate, regular rhythm and intact distal pulses. No M/R/G Pulmonary/chest: Effort normal and breath sounds normal. No wheezing, rales or rhonchi. Abdominal: Soft, nontender, nondistended. Bowel sounds active throughout. There  are no masses palpable. No hepatosplenomegaly. Extremities: no clubbing, cyanosis, or edema Neurological: Alert and oriented to person place and time. Skin: Skin is warm and dry. Psychiatric: Normal mood and affect. Behavior is normal.  CMP     Component Value Date/Time   NA 138 12/08/2016 1121   K 4.2 12/08/2016 1121   CL 100 12/08/2016 1121   CO2 32 12/08/2016 1121   GLUCOSE 103 (H) 12/08/2016 1121   GLUCOSE 91 04/27/2006 1058   BUN 14 12/08/2016 1121   CREATININE 0.80 12/08/2016 1121   CALCIUM 9.9 12/08/2016 1121   PROT 7.5 12/08/2016 1121   ALBUMIN 4.6 12/08/2016 1121   AST 21 12/08/2016 1121   ALT 22 12/08/2016 1121   ALKPHOS 77 12/08/2016 1121   BILITOT 0.5 12/08/2016 1121   GFRNONAA 80.08 10/10/2009 0811   GFRAA 82 06/08/2007 0831    CBC    Component Value Date/Time   WBC 5.9 12/08/2016 1121   RBC 4.53 12/08/2016 1121   HGB 14.4 12/08/2016 1121   HCT 42.6 12/08/2016 1121   PLT 281.0 12/08/2016 1121   MCV 94.1 12/08/2016 1121   MCHC 33.8 12/08/2016 1121   RDW 12.5 12/08/2016 1121   LYMPHSABS 1.5 12/08/2016 1121   MONOABS 0.5 12/08/2016 1121   EOSABS 0.2 12/08/2016 1121   BASOSABS 0.1 12/08/2016 1121        Assessment & Plan:  72 year old female with a history of Crohn's ileitis, GERD with hiatal hernia with predominant LPR symptoms, hyperlipidemia and migraines who is here  for follow-up.  1.  Ileal Crohn's disease --doing well with endoscopic remission at the time of her last colonoscopy.  Continue Pentasa 1.5 g twice daily.  I am hesitant to wean this medication as prior attempt to wean resulted in flare of Crohn's disease.  She is happy with this plan. --She is up-to-date with Pneumovax and influenza vaccine  2.  LPR --well-controlled laryngeal symptoms on omeprazole.  Continue omeprazole 40 mg daily.  3.  History of colon polyp --1 cm sessile serrated polyp at colonoscopy in May 2017, repeat in May 2020.  I will see her in 1 year and we can arrange  surveillance colonoscopy after that visit.  15 minutes spent with the patient today. Greater than 50% was spent in counseling and coordination of care with the patient

## 2017-07-18 DIAGNOSIS — Z974 Presence of external hearing-aid: Secondary | ICD-10-CM | POA: Diagnosis not present

## 2017-07-18 DIAGNOSIS — J310 Chronic rhinitis: Secondary | ICD-10-CM | POA: Diagnosis not present

## 2017-07-18 DIAGNOSIS — H903 Sensorineural hearing loss, bilateral: Secondary | ICD-10-CM | POA: Diagnosis not present

## 2017-07-18 DIAGNOSIS — K219 Gastro-esophageal reflux disease without esophagitis: Secondary | ICD-10-CM | POA: Diagnosis not present

## 2017-07-18 DIAGNOSIS — H6241 Otitis externa in other diseases classified elsewhere, right ear: Secondary | ICD-10-CM | POA: Diagnosis not present

## 2017-07-18 DIAGNOSIS — H6121 Impacted cerumen, right ear: Secondary | ICD-10-CM | POA: Diagnosis not present

## 2017-07-18 DIAGNOSIS — B369 Superficial mycosis, unspecified: Secondary | ICD-10-CM | POA: Diagnosis not present

## 2017-10-01 ENCOUNTER — Other Ambulatory Visit: Payer: Self-pay | Admitting: Internal Medicine

## 2017-10-05 ENCOUNTER — Other Ambulatory Visit: Payer: Self-pay | Admitting: Obstetrics & Gynecology

## 2017-10-05 DIAGNOSIS — Z1231 Encounter for screening mammogram for malignant neoplasm of breast: Secondary | ICD-10-CM

## 2017-11-14 ENCOUNTER — Ambulatory Visit
Admission: RE | Admit: 2017-11-14 | Discharge: 2017-11-14 | Disposition: A | Discharge status: Home / Self Care | Payer: Medicare Other | Source: Ambulatory Visit | Attending: Obstetrics & Gynecology | Admitting: Obstetrics & Gynecology

## 2017-11-14 DIAGNOSIS — Z1231 Encounter for screening mammogram for malignant neoplasm of breast: Secondary | ICD-10-CM

## 2017-11-17 DIAGNOSIS — M79671 Pain in right foot: Secondary | ICD-10-CM | POA: Diagnosis not present

## 2017-12-12 NOTE — Progress Notes (Signed)
Chief Complaint  Patient presents with  . Annual Exam    recently dx with gout  . Medication Management  . Hyperlipidemia    HPI: Bridget Reyes 72 y.o. comes in today for yearly visit  meds  .  LIPIDS  July had sudden onset of foot pain right and dr G orthosis gout and rx prednisone and then a few days of colcrys  May have had an episode similar over a year ago  Hard to decide on side  IBD stable no change in meds   Atrovent ns very helpful for her drippy nose   Dec hearing righ aid   Asks for refill of flexeril to yuse as needed neck and upper back spasm  Only ocass use.    Health Maintenance  Topic Date Due  . INFLUENZA VACCINE  11/17/2017  . COLONOSCOPY  08/18/2018  . MAMMOGRAM  11/15/2019  . TETANUS/TDAP  11/14/2023  . DEXA SCAN  Completed  . Hepatitis C Screening  Completed  . PNA vac Low Risk Adult  Completed   Health Maintenance Review LIFESTYLE:  Exercise:  Yes curves Tobacco/ETS:n Alcohol: ocass Sugar beverages: diet  Sleep:  5-6  Wakening   Problematic as in past  Drug use: no HH: 2 no pets  Some volunteer     Hearing: aid  righ ear   To go to left   Vision:  No limitations at present . Last eye check UTD g;lasses cataracts   Safety:  Has smoke detector and wears seat belts. . No excess sun exposure. Sees dentist regularly.  Falls:n  Memory: Felt to be good  , no concern from her or her family.  Depression: No anhedonia unusual crying or depressive symptoms  Nutrition: Eats well balanced diet; adequate calcium and vitamin D. No swallowing chewing problems.  Injury: no major injuries in the last six months.  Other healthcare providers:  Reviewed today .  Preventive parameters: up-to-date  Reviewed   ADLS:   There are no problems or need for assistance  driving, feeding, obtaining food, dressing, toileting and bathing, managing money using phone. She is independent.   ROS:  Burns ball of feet  No change only at night  No  claudication neuropathysx  Last b12 level 2017 nl GEN/ HEENT: No fever, significant weight changes sweats headaches vision problems hearing changes, CV/ PULM; No chest pain shortness of breath cough, syncope,edema  change in exercise tolerance. GI /GU: No adominal pain, vomiting, change in bowel habits. No blood in the stool. No significant GU symptoms. SKIN/HEME: ,no acute skin rashes suspicious lesions or bleeding. No lymphadenopathy, nodules, masses.  NEURO/ PSYCH:  No neurologic signs such as weakness numbness. No depression anxiety. IMM/ Allergy: No unusual infections.  Allergy .   REST of 12 system review negative except as per HPI   Past Medical History:  Diagnosis Date  . Allergy   . Arthritis    knees, neck - no meds  . Cataract    bilateral - MD just watchning  . Colon polyps   . Crohn's   . Eczema   . Esophageal reflux   . Gastritis   . Hiatal hernia   . Hyperlipemia   . Ileitis   . Migraines    tx with naproxen - last one 4 yrs ago  . Rosacea   . Skin cancer of face    scca removed     Family History  Problem Relation Age of Onset  . Parkinsonism Mother   .  Lung cancer Mother   . Heart disease Father        died chf in 59 s  . Hypertension Father   . Congestive Heart Failure Father   . Breast cancer Cousin   . Colon cancer Neg Hx   . Esophageal cancer Neg Hx   . Rectal cancer Neg Hx   . Stomach cancer Neg Hx     Social History   Socioeconomic History  . Marital status: Married    Spouse name: Not on file  . Number of children: 2  . Years of education: Not on file  . Highest education level: Not on file  Occupational History  . Occupation: retired     Comment: Games developer: UNEMPLOYED  Social Needs  . Financial resource strain: Not on file  . Food insecurity:    Worry: Not on file    Inability: Not on file  . Transportation needs:    Medical: Not on file    Non-medical: Not on file  Tobacco Use  . Smoking status: Never  Smoker  . Smokeless tobacco: Never Used  Substance and Sexual Activity  . Alcohol use: Yes    Alcohol/week: 3.0 - 4.0 standard drinks    Types: 3 - 4 Glasses of wine per week    Comment: occ  . Drug use: No  . Sexual activity: Yes    Partners: Male    Birth control/protection: Surgical    Comment: hysterectomy  Lifestyle  . Physical activity:    Days per week: Not on file    Minutes per session: Not on file  . Stress: Not on file  Relationships  . Social connections:    Talks on phone: Not on file    Gets together: Not on file    Attends religious service: Not on file    Active member of club or organization: Not on file    Attends meetings of clubs or organizations: Not on file    Relationship status: Not on file  Other Topics Concern  . Not on file  Social History Narrative   hh  Of 2       Dog    Married    G2P2    Outpatient Encounter Medications as of 12/13/2017  Medication Sig  . acetaminophen (TYLENOL) 500 MG tablet Take 500 mg by mouth.  . Azelaic Acid (FINACEA) 15 % cream After skin is thoroughly washed and patted dry, gently but thoroughly massage a thin film of azelaic acid cream into the affected area twice daily, in the morning and evening.  . Estradiol 10 MCG TABS vaginal tablet insert 1 tablet vaginally as directed  . fluticasone (FLONASE) 50 MCG/ACT nasal spray Place 2 sprays into the nose daily. (Patient taking differently: Place 1 spray into the nose daily. )  . ipratropium (ATROVENT) 0.06 % nasal spray instill 2 sprays into each nostril twice a day to three times a day  . mesalamine (PENTASA) 500 MG CR capsule TAKE 3 CAPSULES BY MOUTH TWICE DAILY  . MULTIPLE VITAMIN PO Take 1 tablet by mouth daily.   . naproxen (NAPROSYN) 500 MG tablet Take 500 mg by mouth 2 (two) times daily with a meal.  . omeprazole (PRILOSEC) 40 MG capsule Take 1 capsule (40 mg total) by mouth daily.  . simvastatin (ZOCOR) 40 MG tablet TAKE 1 TABLET(40 MG) BY MOUTH AT BEDTIME  .  valACYclovir (VALTREX) 1000 MG tablet Take 1 tablet (1,000 mg total) by mouth  daily as needed.  . [DISCONTINUED] ipratropium (ATROVENT) 0.06 % nasal spray instill 2 sprays into each nostril twice a day to three times a day  . [DISCONTINUED] simvastatin (ZOCOR) 40 MG tablet TAKE 1 TABLET(40 MG) BY MOUTH AT BEDTIME  . [DISCONTINUED] valACYclovir (VALTREX) 1000 MG tablet Take 1 tablet (1,000 mg total) by mouth daily as needed.  . cyclobenzaprine (FLEXERIL) 5 MG tablet Take 1 tablet (5 mg total) by mouth 2 (two) times daily as needed for muscle spasms.   No facility-administered encounter medications on file as of 12/13/2017.     EXAM:  BP 140/68 (BP Location: Right Arm, Patient Position: Sitting, Cuff Size: Normal)   Pulse 70   Temp 97.7 F (36.5 C) (Oral)   Ht 5\' 5"  (1.651 m)   Wt 152 lb 2 oz (69 kg)   LMP 04/19/1992   SpO2 98%   BMI 25.31 kg/m   Body mass index is 25.31 kg/m.  Physical Exam: Vital signs reviewed [id number] is a well-developed well-nourished alert cooperative   who appears stated age in no acute distress.  HEENT: normocephalic atraumatic , Eyes: PERRL EOM's full, conjunctiva clear, Nares: paten,t no deformity discharge or tenderness., Ears: no deformity EAC's clear TMs with normal landmarks. Mouth: clear OP, no lesions, edema.  Moist mucous membranes. Dentition in adequate repair. NECK: supple without masses, thyromegaly or bruits. CHEST/PULM:  Clear to auscultation and percussion breath sounds equal no wheeze , rales or rhonchi. No chest wall deformities or tenderness. Breast: normal by inspection . No dimpling, discharge, masses, tenderness or discharge . CV: PMI is nondisplaced, S1 S2 no gallops, murmurs, rubs. Peripheral pulses are full without delay.No JVD .  ABDOMEN: Bowel sounds normal nontender  No guard or rebound, no hepato splenomegal no CVA tenderness.   Extremtities:  No clubbing cyanosis or edema, no acute joint swelling or redness no focal atrophy NEURO:   Oriented x3, cranial nerves 3-12 appear to be intact, no obvious focal weakness,gait within normal limits no abnormal reflexes or asymmetrical SKIN: No acute rashes normal turgor, color, no bruising or petechiae. PSYCH: Oriented, good eye contact, no obvious depression anxiety, cognition and judgment appear normal. LN: no cervical axillary inguinal adenopathy No noted deficits in memory, attention, and speech.   Lab Results  Component Value Date   WBC 5.2 12/13/2017   HGB 14.4 12/13/2017   HCT 41.5 12/13/2017   PLT 244.0 12/13/2017   GLUCOSE 94 12/13/2017   CHOL 162 12/13/2017   TRIG 156.0 (H) 12/13/2017   HDL 46.10 12/13/2017   LDLCALC 85 12/13/2017   ALT 26 12/13/2017   AST 20 12/13/2017   NA 138 12/13/2017   K 4.9 12/13/2017   CL 101 12/13/2017   CREATININE 0.82 12/13/2017   BUN 19 12/13/2017   CO2 32 12/13/2017   TSH 3.56 12/13/2017   HGBA1C 5.6 12/13/2017    ASSESSMENT AND PLAN:  Discussed the following assessment and plan:  Hyperlipidemia, unspecified hyperlipidemia type - Plan: Basic metabolic panel, CBC with Differential/Platelet, Hepatic function panel, Hemoglobin A1c, Lipid panel, TSH, Uric Acid  Medication management - Plan: Basic metabolic panel, CBC with Differential/Platelet, Hepatic function panel, Hemoglobin A1c, Lipid panel, TSH, Uric Acid  Crohn's disease of both small and large intestine without complication ([place]) - Plan: Basic metabolic panel, CBC with Differential/Platelet, Hepatic function panel, Hemoglobin A1c, Lipid panel, TSH, Uric Acid  Hearing aid worn  Fasting hyperglycemia - Plan: Basic metabolic panel, CBC with Differential/Platelet, Hepatic function panel, Hemoglobin A1c, Lipid panel, TSH, Uric  Acid  History of gout - Plan: Basic metabolic panel, CBC with Differential/Platelet, Hepatic function panel, Hemoglobin A1c, Lipid panel, TSH, Uric Acid Lab monitoring    Expectant management. About gout and  If recurrent intervention etc  Caution  with meds   High risk  Patient Care Team: Abdulloh Ullom, Standley Brooking, MD as PCP - General Pedro Earls, MD (Family Medicine) [place], Amy, MD (Dermatology) Megan Salon, MD (Gynecology) Orie Rout, MD as Referring Physician (Specialist) Marygrace Drought, MD (Ophthalmology) Jerrell Belfast, MD as Consulting Physician (Otolaryngology) Marica Otter, OD (Optometry) Pyrtle, Lajuan Lines, MD as Consulting Physician (Gastroenterology)  Patient Instructions   Continue lifestyle intervention healthy eating and exercise . Will notify you  of labs when available.  Caution with  muscle relaxants   Will refill the meds discussed . Plan fu  In a year or if recurrant problems .    Low-Purine Diet Purines are compounds that affect the level of uric acid in your body. A low-purine diet is a diet that is low in purines. Eating a low-purine diet can prevent the level of uric acid in your body from getting too high and causing gout or kidney stones or both. What do I need to know about this diet?  Choose low-purine foods. Examples of low-purine foods are listed in the next section.  Drink plenty of fluids, especially water. Fluids can help remove uric acid from your body. Try to drink 8-16 cups (1.9-3.8 L) a day.  Limit foods high in fat, especially saturated fat, as fat makes it harder for the body to get rid of uric acid. Foods high in saturated fat include pizza, cheese, ice cream, whole milk, fried foods, and gravies. Choose foods that are lower in fat and lean sources of protein. Use olive oil when cooking as it contains healthy fats that are not high in saturated fat.  Limit alcohol. Alcohol interferes with the elimination of uric acid from your body. If you are having a gout attack, avoid all alcohol.  Keep in mind that different people's bodies react differently to different foods. You will probably learn over time which foods do or do not affect you. If you discover that a food tends to cause your  gout to flare up, avoid eating that food. You can more freely enjoy foods that do not cause problems. If you have any questions about a food item, talk to your dietitian or health care provider. Which foods are low, moderate, and high in purines? The following is a list of foods that are low, moderate, and high in purines. You can eat any amount of the foods that are low in purines. You may be able to have small amounts of foods that are moderate in purines. Ask your health care provider how much of a food moderate in purines you can have. Avoid foods high in purines. Grains  Foods low in purines: Enriched white bread, pasta, rice, cake, cornbread, popcorn.  Foods moderate in purines: Whole-grain breads and cereals, wheat germ, bran, oatmeal. Uncooked oatmeal. Dry wheat bran or wheat germ.  Foods high in purines: Pancakes, [place] toast, biscuits, muffins. Vegetables  Foods low in purines: All vegetables, except those that are moderate in purines.  Foods moderate in purines: Asparagus, cauliflower, spinach, mushrooms, green peas. Fruits  All fruits are low in purines. Meats and other Protein Foods  Foods low in purines: Eggs, nuts, peanut butter.  Foods moderate in purines: 80-90% lean beef, lamb, veal, pork, poultry, fish, eggs, peanut  butter, nuts. Crab, lobster, oysters, and shrimp. Cooked dried beans, peas, and lentils.  Foods high in purines: Anchovies, sardines, herring, mussels, tuna, codfish, scallops, trout, and haddock. Berniece Salines. Organ meats (such as liver or kidney). Tripe. Game meat. Goose. Sweetbreads. Dairy  All dairy foods are low in purines. Low-fat and fat-free dairy products are best because they are low in saturated fat. Beverages  Drinks low in purines: Water, carbonated beverages, tea, coffee, cocoa.  Drinks moderate in purines: Soft drinks and other drinks sweetened with high-fructose corn syrup. Juices. To find whether a food or drink is sweetened with high-fructose  corn syrup, look at the ingredients list.  Drinks high in purines: Alcoholic beverages (such as beer). Condiments  Foods low in purines: Salt, herbs, olives, pickles, relishes, vinegar.  Foods moderate in purines: Butter, margarine, oils, mayonnaise. Fats and Oils  Foods low in purines: All types, except gravies and sauces made with meat.  Foods high in purines: Gravies and sauces made with meat. Other Foods  Foods low in purines: Sugars, sweets, gelatin. Cake. Soups made without meat.  Foods moderate in purines: Meat-based or fish-based soups, broths, or bouillons. Foods and drinks sweetened with high-fructose corn syrup.  Foods high in purines: High-fat desserts (such as ice cream, cookies, cakes, pies, doughnuts, and chocolate). Contact your dietitian for more information on foods that are not listed here. This information is not intended to replace advice given to you by your health care provider. Make sure you discuss any questions you have with your health care provider. Document Released: 07/31/2010 Document Revised: 09/11/2015 Document Reviewed: 03/12/2013 Elsevier Interactive Patient Education  2017 [place] K. Nikesh Teschner M.D.

## 2017-12-13 ENCOUNTER — Encounter: Payer: Self-pay | Admitting: Internal Medicine

## 2017-12-13 ENCOUNTER — Ambulatory Visit (INDEPENDENT_AMBULATORY_CARE_PROVIDER_SITE_OTHER): Payer: Medicare Other | Admitting: Internal Medicine

## 2017-12-13 VITALS — BP 140/68 | HR 70 | Temp 97.7°F | Ht 65.0 in | Wt 152.1 lb

## 2017-12-13 DIAGNOSIS — E785 Hyperlipidemia, unspecified: Secondary | ICD-10-CM

## 2017-12-13 DIAGNOSIS — Z8739 Personal history of other diseases of the musculoskeletal system and connective tissue: Secondary | ICD-10-CM | POA: Diagnosis not present

## 2017-12-13 DIAGNOSIS — K5080 Crohn's disease of both small and large intestine without complications: Secondary | ICD-10-CM

## 2017-12-13 DIAGNOSIS — Z974 Presence of external hearing-aid: Secondary | ICD-10-CM | POA: Diagnosis not present

## 2017-12-13 DIAGNOSIS — R7301 Impaired fasting glucose: Secondary | ICD-10-CM | POA: Diagnosis not present

## 2017-12-13 DIAGNOSIS — Z79899 Other long term (current) drug therapy: Secondary | ICD-10-CM | POA: Diagnosis not present

## 2017-12-13 LAB — CBC WITH DIFFERENTIAL/PLATELET
BASOS ABS: 0 10*3/uL (ref 0.0–0.1)
Basophils Relative: 0.7 % (ref 0.0–3.0)
EOS PCT: 1.7 % (ref 0.0–5.0)
Eosinophils Absolute: 0.1 10*3/uL (ref 0.0–0.7)
HCT: 41.5 % (ref 36.0–46.0)
HEMOGLOBIN: 14.4 g/dL (ref 12.0–15.0)
Lymphocytes Relative: 22.4 % (ref 12.0–46.0)
Lymphs Abs: 1.2 10*3/uL (ref 0.7–4.0)
MCHC: 34.7 g/dL (ref 30.0–36.0)
MCV: 93.1 fl (ref 78.0–100.0)
Monocytes Absolute: 0.5 10*3/uL (ref 0.1–1.0)
Monocytes Relative: 9.3 % (ref 3.0–12.0)
Neutro Abs: 3.4 10*3/uL (ref 1.4–7.7)
Neutrophils Relative %: 65.9 % (ref 43.0–77.0)
Platelets: 244 10*3/uL (ref 150.0–400.0)
RBC: 4.46 Mil/uL (ref 3.87–5.11)
RDW: 12.7 % (ref 11.5–15.5)
WBC: 5.2 10*3/uL (ref 4.0–10.5)

## 2017-12-13 LAB — BASIC METABOLIC PANEL
BUN: 19 mg/dL (ref 6–23)
CALCIUM: 10.2 mg/dL (ref 8.4–10.5)
CO2: 32 mEq/L (ref 19–32)
Chloride: 101 mEq/L (ref 96–112)
Creatinine, Ser: 0.82 mg/dL (ref 0.40–1.20)
GFR: 72.69 mL/min (ref >60.00)
Glucose, Bld: 94 mg/dL (ref 70–99)
POTASSIUM: 4.9 meq/L (ref 3.5–5.1)
SODIUM: 138 meq/L (ref 135–145)

## 2017-12-13 LAB — LIPID PANEL
CHOL/HDL RATIO: 4
Cholesterol: 162 mg/dL (ref 0–200)
HDL: 46.1 mg/dL (ref >39.00)
LDL CALC: 85 mg/dL (ref 0–99)
NONHDL: 116.09
Triglycerides: 156 mg/dL — ABNORMAL HIGH (ref 0.0–149.0)
VLDL: 31.2 mg/dL (ref 0.0–40.0)

## 2017-12-13 LAB — TSH: TSH: 3.56 u[IU]/mL (ref 0.35–4.50)

## 2017-12-13 LAB — HEPATIC FUNCTION PANEL
ALK PHOS: 83 U/L (ref 39–117)
ALT: 26 U/L (ref 0–35)
AST: 20 U/L (ref 0–37)
Albumin: 4.5 g/dL (ref 3.5–5.2)
BILIRUBIN DIRECT: 0.1 mg/dL (ref 0.0–0.3)
BILIRUBIN TOTAL: 0.5 mg/dL (ref 0.2–1.2)
Total Protein: 7.4 g/dL (ref 6.0–8.3)

## 2017-12-13 LAB — HEMOGLOBIN A1C: HEMOGLOBIN A1C: 5.6 % (ref 4.6–6.5)

## 2017-12-13 LAB — URIC ACID: Uric Acid, Serum: 3.7 mg/dL (ref 2.4–7.0)

## 2017-12-13 MED ORDER — VALACYCLOVIR HCL 1 G PO TABS: 1000.0000 mg | ORAL_TABLET | Freq: Every day | ORAL | 11 refills | Status: DC | PRN

## 2017-12-13 MED ORDER — CYCLOBENZAPRINE HCL 5 MG PO TABS: 5.0000 mg | ORAL_TABLET | Freq: Two times a day (BID) | ORAL | 1 refills | Status: DC | PRN

## 2017-12-13 MED ORDER — IPRATROPIUM BROMIDE 0.06 % NA SOLN: NASAL | 99 refills | Status: DC

## 2017-12-13 MED ORDER — SIMVASTATIN 40 MG PO TABS: ORAL_TABLET | ORAL | 3 refills | Status: DC

## 2017-12-13 NOTE — Patient Instructions (Signed)
Continue lifestyle intervention healthy eating and exercise . Will notify you  of labs when available.  Caution with  muscle relaxants   Will refill the meds discussed . Plan fu  In a year or if recurrant problems .    Low-Purine Diet Purines are compounds that affect the level of uric acid in your body. A low-purine diet is a diet that is low in purines. Eating a low-purine diet can prevent the level of uric acid in your body from getting too high and causing gout or kidney stones or both. What do I need to know about this diet?  Choose low-purine foods. Examples of low-purine foods are listed in the next section.  Drink plenty of fluids, especially water. Fluids can help remove uric acid from your body. Try to drink 8-16 cups (1.9-3.8 L) a day.  Limit foods high in fat, especially saturated fat, as fat makes it harder for the body to get rid of uric acid. Foods high in saturated fat include pizza, cheese, ice cream, whole milk, fried foods, and gravies. Choose foods that are lower in fat and lean sources of protein. Use olive oil when cooking as it contains healthy fats that are not high in saturated fat.  Limit alcohol. Alcohol interferes with the elimination of uric acid from your body. If you are having a gout attack, avoid all alcohol.  Keep in mind that different people's bodies react differently to different foods. You will probably learn over time which foods do or do not affect you. If you discover that a food tends to cause your gout to flare up, avoid eating that food. You can more freely enjoy foods that do not cause problems. If you have any questions about a food item, talk to your dietitian or health care provider. Which foods are low, moderate, and high in purines? The following is a list of foods that are low, moderate, and high in purines. You can eat any amount of the foods that are low in purines. You may be able to have small amounts of foods that are moderate in  purines. Ask your health care provider how much of a food moderate in purines you can have. Avoid foods high in purines. Grains  Foods low in purines: Enriched white bread, pasta, rice, cake, cornbread, popcorn.  Foods moderate in purines: Whole-grain breads and cereals, wheat germ, bran, oatmeal. Uncooked oatmeal. Dry wheat bran or wheat germ.  Foods high in purines: Pancakes, [place] toast, biscuits, muffins. Vegetables  Foods low in purines: All vegetables, except those that are moderate in purines.  Foods moderate in purines: Asparagus, cauliflower, spinach, mushrooms, green peas. Fruits  All fruits are low in purines. Meats and other Protein Foods  Foods low in purines: Eggs, nuts, peanut butter.  Foods moderate in purines: 80-90% lean beef, lamb, veal, pork, poultry, fish, eggs, peanut butter, nuts. Crab, lobster, oysters, and shrimp. Cooked dried beans, peas, and lentils.  Foods high in purines: Anchovies, sardines, herring, mussels, tuna, codfish, scallops, trout, and haddock. Berniece Salines. Organ meats (such as liver or kidney). [place]. Game meat. Goose. Sweetbreads. Dairy  All dairy foods are low in purines. Low-fat and fat-free dairy products are best because they are low in saturated fat. Beverages  Drinks low in purines: Water, carbonated beverages, tea, coffee, cocoa.  Drinks moderate in purines: Soft drinks and other drinks sweetened with high-fructose corn syrup. Juices. To find whether a food or drink is sweetened with high-fructose corn syrup, look at the ingredients  list.  Drinks high in purines: Alcoholic beverages (such as beer). Condiments  Foods low in purines: Salt, herbs, olives, pickles, relishes, vinegar.  Foods moderate in purines: Butter, margarine, oils, mayonnaise. Fats and Oils  Foods low in purines: All types, except gravies and sauces made with meat.  Foods high in purines: Gravies and sauces made with meat. Other Foods  Foods low in purines:  Sugars, sweets, gelatin. Cake. Soups made without meat.  Foods moderate in purines: Meat-based or fish-based soups, broths, or bouillons. Foods and drinks sweetened with high-fructose corn syrup.  Foods high in purines: High-fat desserts (such as ice cream, cookies, cakes, pies, doughnuts, and chocolate). Contact your dietitian for more information on foods that are not listed here. This information is not intended to replace advice given to you by your health care provider. Make sure you discuss any questions you have with your health care provider. Document Released: 07/31/2010 Document Revised: 09/11/2015 Document Reviewed: 03/12/2013 Elsevier Interactive Patient Education  2017 [employer].

## 2017-12-21 ENCOUNTER — Telehealth: Payer: Self-pay | Admitting: *Deleted

## 2017-12-21 NOTE — Telephone Encounter (Signed)
Prior auth for Cyclobenzaprine 102m sent to [email or web address].

## 2017-12-22 ENCOUNTER — Telehealth: Payer: Self-pay | Admitting: Internal Medicine

## 2017-12-22 NOTE — Telephone Encounter (Signed)
Pt aware. Nothing further needed 

## 2017-12-22 NOTE — Telephone Encounter (Signed)
Copied from [place] [id number]. Topic: General - Other >> Dec 22, 2017  1:56 PM Lennox Solders wrote: Reason for CRM: rhea [facility] PA dept is calling to let dr Regis Bill know the cyclobenzaprine has been approve from 12-21-17 to 12-22-2018

## 2017-12-26 NOTE — Telephone Encounter (Signed)
See note from 9/5.  Rx was approved and pt was informed.

## 2018-01-04 DIAGNOSIS — Z23 Encounter for immunization: Secondary | ICD-10-CM | POA: Diagnosis not present

## 2018-01-22 ENCOUNTER — Other Ambulatory Visit: Payer: Self-pay | Admitting: Obstetrics & Gynecology

## 2018-01-23 NOTE — Telephone Encounter (Signed)
Medication refill request: Estradiol Last AEX:  12/28/2016 Next AEX: 03/07/2018 Last MMG (if hormonal medication request): 11/14/2017 BI-RADS CATEGORY  1: Negative. Refill authorized: #[id number], 3 refills

## 2018-03-07 ENCOUNTER — Other Ambulatory Visit: Payer: Self-pay

## 2018-03-07 ENCOUNTER — Ambulatory Visit (INDEPENDENT_AMBULATORY_CARE_PROVIDER_SITE_OTHER): Payer: Medicare Other | Admitting: Obstetrics & Gynecology

## 2018-03-07 ENCOUNTER — Encounter

## 2018-03-07 ENCOUNTER — Encounter: Payer: Self-pay | Admitting: Obstetrics & Gynecology

## 2018-03-07 VITALS — BP 138/70 | HR 68 | Resp 16 | Ht 65.25 in | Wt 150.8 lb

## 2018-03-07 DIAGNOSIS — Z124 Encounter for screening for malignant neoplasm of cervix: Secondary | ICD-10-CM

## 2018-03-07 DIAGNOSIS — M10071 Idiopathic gout, right ankle and foot: Secondary | ICD-10-CM | POA: Diagnosis not present

## 2018-03-07 DIAGNOSIS — M109 Gout, unspecified: Secondary | ICD-10-CM | POA: Insufficient documentation

## 2018-03-07 DIAGNOSIS — Z01419 Encounter for gynecological examination (general) (routine) without abnormal findings: Secondary | ICD-10-CM | POA: Diagnosis not present

## 2018-03-07 MED ORDER — NONFORMULARY OR COMPOUNDED ITEM: 4 refills | Status: DC

## 2018-03-07 NOTE — Progress Notes (Signed)
72 y.o. G2P2 Married White or Caucasian female here for annual exam.  Doing well except for tinnitus.  Wears hearing aids.  Is seeing by audiology.  Denies vaginal bleeding.  Reports having a little brownish discharge after she placed the estradiol vaginally.  This does not happen every time.  Feels irritated after use.  Did have reaction with premarin in the past.  Patient's last menstrual period was 04/19/1992.          Sexually active: Yes.    The current method of family planning is status post hysterectomy.    Exercising: Yes.    [employer] Smoker:  no  Health Maintenance: Pap:  12/28/16 Neg   07/09/13 Neg  History of abnormal Pap:  no MMG:  11/14/17 BIRADS1:neg  Colonoscopy:  08/18/15 polyp. F/u 3 years  BMD:   11/05/14 Normal  TDaP:  2015 Pneumonia vaccine(s):  2015 and 2013 Shingrix:   Did have Zostavax and Shingrix  Hep C testing: 07/07/15 Neg  Screening Labs: PCP   reports that she has never smoked. She has never used smokeless tobacco. She reports that she drinks about 1.0 - 2.0 standard drinks of alcohol per week. She reports that she does not use drugs.  Past Medical History:  Diagnosis Date  . Allergy   . Arthritis    knees, neck - no meds  . Cataract    bilateral - MD just watchning  . Colon polyps   . Crohn's   . Eczema   . Esophageal reflux   . Gastritis   . Hiatal hernia   . Hyperlipemia   . Ileitis   . Migraines    tx with naproxen - last one 4 yrs ago  . Rosacea   . Skin cancer of face    scca removed     Past Surgical History:  Procedure Laterality Date  . BREAST BIOPSY Left 1980  . BREAST EXCISIONAL BIOPSY Left   . COLONOSCOPY  08/2015   Pyrtle -polyp  . TONSILLECTOMY    . TOTAL VAGINAL HYSTERECTOMY  1994  . UPPER GI ENDOSCOPY     hiatal hernia  . WISDOM TOOTH EXTRACTION      Current Outpatient Medications  Medication Sig Dispense Refill  . acetaminophen (TYLENOL) 500 MG tablet Take 500 mg by mouth.    . Azelaic Acid (FINACEA) 15 % cream  After skin is thoroughly washed and patted dry, gently but thoroughly massage a thin film of azelaic acid cream into the affected area twice daily, in the morning and evening. 50 g 11  . cyclobenzaprine (FLEXERIL) 5 MG tablet Take 1 tablet (5 mg total) by mouth 2 (two) times daily as needed for muscle spasms. 20 tablet 1  . Estradiol 10 MCG TABS vaginal tablet INSERT ONE TABLET IN THE VAGINA AS DIRECTED 24 tablet 0  . fluticasone (FLONASE) 50 MCG/ACT nasal spray Place 2 sprays into the nose daily. (Patient taking differently: Place 1 spray into the nose daily. ) 16 g 11  . ipratropium (ATROVENT) 0.06 % nasal spray instill 2 sprays into each nostril twice a day to three times a day 15 mL prn  . mesalamine (PENTASA) 500 MG CR capsule TAKE 3 CAPSULES BY MOUTH TWICE DAILY 540 capsule 3  . MULTIPLE VITAMIN PO Take 1 tablet by mouth daily.     . naproxen (NAPROSYN) 500 MG tablet Take 500 mg by mouth 2 (two) times daily with a meal.    . Omega-3 1000 MG CAPS Take by  mouth daily.    Marland Kitchen omeprazole (PRILOSEC) 40 MG capsule Take 1 capsule (40 mg total) by mouth daily. 90 capsule 3  . simvastatin (ZOCOR) 40 MG tablet TAKE 1 TABLET(40 MG) BY MOUTH AT BEDTIME 90 tablet 3  . valACYclovir (VALTREX) 1000 MG tablet Take 1 tablet (1,000 mg total) by mouth daily as needed. 30 tablet 11   No current facility-administered medications for this visit.     Family History  Problem Relation Age of Onset  . Parkinsonism Mother   . Lung cancer Mother   . Heart disease Father        died chf in 73 s  . Hypertension Father   . Congestive Heart Failure Father   . Breast cancer Cousin   . Colon cancer Neg Hx   . Esophageal cancer Neg Hx   . Rectal cancer Neg Hx   . Stomach cancer Neg Hx     Review of Systems  All other systems reviewed and are negative.   Exam:   BP 138/70 (BP Location: Right Arm, Patient Position: Sitting, Cuff Size: Normal)   Pulse 68   Resp 16   Ht 5' 5.25" (1.657 m)   Wt 150 lb 12.8 oz  (68.4 kg)   LMP 04/19/1992   BMI 24.90 kg/m     Height: 5' 5.25" (165.7 cm)  Ht Readings from Last 3 Encounters:  03/07/18 5' 5.25" (1.657 m)  12/13/17 5\' 5"  (1.651 m)  07/12/17 5' 5.25" (1.657 m)    General appearance: alert, cooperative and appears stated age Head: Normocephalic, without obvious abnormality, atraumatic Neck: no adenopathy, supple, symmetrical, trachea midline and thyroid normal to inspection and palpation Lungs: clear to auscultation bilaterally Breasts: normal appearance, no masses or tenderness Heart: regular rate and rhythm Abdomen: soft, non-tender; bowel sounds normal; no masses,  no organomegaly Extremities: extremities normal, atraumatic, no cyanosis or edema Skin: Skin color, texture, turgor normal. No rashes or lesions Lymph nodes: Cervical, supraclavicular, and axillary nodes normal. No abnormal inguinal nodes palpated Neurologic: Grossly normal  Pelvic: External genitalia:  no lesions              Urethra:  normal appearing urethra with no masses, tenderness or lesions              Bartholins and Skenes: normal                 Vagina: normal appearing vagina with normal color and discharge, no lesions              Cervix: absent              Pap taken: No. Bimanual Exam:  Uterus:  uterus absent              Adnexa: no mass, fullness, tenderness               Rectovaginal: Confirms               Anus:  normal sphincter tone, no lesions  Chaperone was present for exam.  A:  Well Woman with normal exam PMP, no HRT H/o TVH H/o vaginal ulceration with granulation tissue after Estring use--normal appearance today H/O crohn's ileitis, followed by Dr. Hilarie Fredrickson.  Colonoscopy due next year.  P:   Mammogram guidelines reviewed.  Doing 3D. MMG. pap smear neg 2018.  Not indicated today Will try Vit E vaginal suppositories, 200u/ml one pv three times weekly.  #36/4RF Lab work done with Dr. Regis Bill Colonoscopy next  year BMD UTD return annually or prn

## 2018-03-22 ENCOUNTER — Other Ambulatory Visit: Payer: Self-pay | Admitting: Internal Medicine

## 2018-05-30 DIAGNOSIS — L603 Nail dystrophy: Secondary | ICD-10-CM | POA: Diagnosis not present

## 2018-05-30 DIAGNOSIS — D235 Other benign neoplasm of skin of trunk: Secondary | ICD-10-CM | POA: Diagnosis not present

## 2018-05-30 DIAGNOSIS — L918 Other hypertrophic disorders of the skin: Secondary | ICD-10-CM | POA: Diagnosis not present

## 2018-05-30 DIAGNOSIS — L814 Other melanin hyperpigmentation: Secondary | ICD-10-CM | POA: Diagnosis not present

## 2018-05-30 DIAGNOSIS — L821 Other seborrheic keratosis: Secondary | ICD-10-CM | POA: Diagnosis not present

## 2018-05-30 DIAGNOSIS — D2272 Melanocytic nevi of left lower limb, including hip: Secondary | ICD-10-CM | POA: Diagnosis not present

## 2018-05-30 DIAGNOSIS — L218 Other seborrheic dermatitis: Secondary | ICD-10-CM | POA: Diagnosis not present

## 2018-06-05 ENCOUNTER — Telehealth: Payer: Self-pay | Admitting: Obstetrics & Gynecology

## 2018-06-05 ENCOUNTER — Encounter: Payer: Self-pay | Admitting: Obstetrics & Gynecology

## 2018-06-05 NOTE — Telephone Encounter (Signed)
Yes, she can use both and alternate.  Thanks.

## 2018-06-05 NOTE — Telephone Encounter (Signed)
Patient sent the following correspondence through [facility]. Routing to triage to assist patient with request.  I am now using vitamin e suppositories, I really like them. I have a two month supply of Estradiol inserts remaining, can I use them on alternate nights from vitamin e suppositories until I have used them all?   Thank you,  Bridget Reyes

## 2018-06-05 NOTE — Telephone Encounter (Signed)
MyChart message to patient.  ° °Encounter closed.  °

## 2018-06-05 NOTE — Telephone Encounter (Signed)
Routing to Dr. Miller to review and advise.  

## 2018-06-08 ENCOUNTER — Ambulatory Visit (INDEPENDENT_AMBULATORY_CARE_PROVIDER_SITE_OTHER): Payer: Medicare Other | Admitting: Internal Medicine

## 2018-06-08 ENCOUNTER — Encounter: Payer: Self-pay | Admitting: Internal Medicine

## 2018-06-08 VITALS — BP 132/70 | HR 78 | Ht 65.5 in | Wt 152.2 lb

## 2018-06-08 DIAGNOSIS — K219 Gastro-esophageal reflux disease without esophagitis: Secondary | ICD-10-CM | POA: Diagnosis not present

## 2018-06-08 DIAGNOSIS — K5000 Crohn's disease of small intestine without complications: Secondary | ICD-10-CM | POA: Diagnosis not present

## 2018-06-08 DIAGNOSIS — Z86010 Personal history of colonic polyps: Secondary | ICD-10-CM

## 2018-06-08 MED ORDER — OMEPRAZOLE 40 MG PO CPDR: 40.0000 mg | DELAYED_RELEASE_CAPSULE | Freq: Every day | ORAL | 3 refills | Status: DC

## 2018-06-08 MED ORDER — MESALAMINE ER 500 MG PO CPCR: ORAL_CAPSULE | ORAL | 3 refills | Status: DC

## 2018-06-08 NOTE — Patient Instructions (Addendum)
We have sent the following medications to your pharmacy for you to pick up at your convenience: Pentasa 3 tablets twice daily Prilosec 40 mg daily  Please follow up with Dr Hilarie Fredrickson 1 year after colonoscopy.  You will be due for a recall colonoscopy in 08/2018. We will send you a reminder in the mail when it gets closer to that time.  If you are age 75 or older, your body mass index should be between 23-30. Your Body mass index is 24.95 kg/m. If this is out of the aforementioned range listed, please consider follow up with your Primary Care Provider.  If you are age 50 or younger, your body mass index should be between 19-25. Your Body mass index is 24.95 kg/m. If this is out of the aformentioned range listed, please consider follow up with your Primary Care Provider.

## 2018-06-08 NOTE — Progress Notes (Signed)
   Subjective:    Patient ID: Bridget Reyes, female    DOB: 13-May-1945, 73 y.o.   MRN: [id number]  HPI Bridget Reyes is a 73 year old female with a history of Crohn's ileitis, GERD with hiatal hernia with predominant LPR symptoms, hyperlipidemia and migraines here for follow-up.  She was last seen on 07/12/2017.  She reports she has been doing very well from a GI perspective.  Her GERD though more specifically LPR symptoms such as throat clearing, cough and hoarseness remains well controlled on omeprazole.  She is taking 40 mg daily.  Denies any issues with dysphagia, odynophagia, nausea, vomiting, early satiety.  She continues Pentasa 3 tablets twice daily or 3 g daily.  In the past when this was weaned off she had a flare of her Crohn's related pain.  No abdominal pain recently, change in bowel habits, diarrhea, constipation or blood in stool.   Review of Systems As per HPI, otherwise negative  Current Medications, Allergies, Past Medical History, Past Surgical History, Family History and Social History were reviewed in [employer] record.     Objective:   Physical Exam BP 132/70   Pulse 78   Ht 5' 5.5" (1.664 m)   Wt 152 lb 4 oz (69.1 kg)   LMP 04/19/1992   BMI 24.95 kg/m  Gen: awake, alert, NAD HEENT: anicteric, op clear CV: RRR, no mrg Pulm: CTA b/l Abd: soft, NT/ND, +BS throughout Ext: no c/c/e Neuro: nonfocal     Assessment & Plan:   73 year old female with a history of Crohn's ileitis, GERD with hiatal hernia with predominant LPR symptoms, hyperlipidemia and migraines here for follow-up.   1.  GERD with LPR --controlled on PPI.  Continue omeprazole 40 mg daily.  We discussed the risk, benefits and alternatives to PPI therapy.  2.  Ileal Crohn's disease --doing well clinically and endoscopically at last colonoscopy in 2017.  We discussed the data that mesalamine is often not helpful in ileal Crohn's disease.  That said she certainly feels that it  has been beneficial and when attempt was made to discontinue the medication or reduce the dose she had worsening right lower abdominal pain.  For this reason given the safety of the medication and the fact that for her it is affordable, we will continue mesalamine 1.5 g twice daily.  3.  History of sessile serrated polyp, 10 mm --surveillance colonoscopy recommended in May 2020.  We discussed the risk, benefits and alternatives to repeat colonoscopy and she is agreeable and wishes to proceed.  25 minutes spent today. Greater than 50% was spent in counseling and coordination of care with the patient

## 2018-06-20 DIAGNOSIS — H524 Presbyopia: Secondary | ICD-10-CM | POA: Diagnosis not present

## 2018-06-20 DIAGNOSIS — H5203 Hypermetropia, bilateral: Secondary | ICD-10-CM | POA: Diagnosis not present

## 2018-06-20 DIAGNOSIS — H52223 Regular astigmatism, bilateral: Secondary | ICD-10-CM | POA: Diagnosis not present

## 2018-06-20 DIAGNOSIS — H25813 Combined forms of age-related cataract, bilateral: Secondary | ICD-10-CM | POA: Diagnosis not present

## 2018-07-17 ENCOUNTER — Other Ambulatory Visit: Payer: Self-pay | Admitting: Internal Medicine

## 2018-07-17 DIAGNOSIS — K219 Gastro-esophageal reflux disease without esophagitis: Secondary | ICD-10-CM

## 2018-07-25 ENCOUNTER — Other Ambulatory Visit: Payer: Self-pay | Admitting: Internal Medicine

## 2018-07-28 ENCOUNTER — Other Ambulatory Visit: Payer: Self-pay | Admitting: Internal Medicine

## 2018-09-04 ENCOUNTER — Encounter: Payer: Self-pay | Admitting: Internal Medicine

## 2018-09-06 ENCOUNTER — Encounter: Payer: Self-pay | Admitting: Internal Medicine

## 2018-09-07 ENCOUNTER — Encounter: Payer: Self-pay | Admitting: Internal Medicine

## 2018-09-26 ENCOUNTER — Ambulatory Visit (AMBULATORY_SURGERY_CENTER): Payer: Medicare Other | Admitting: *Deleted

## 2018-09-26 ENCOUNTER — Other Ambulatory Visit: Payer: Self-pay

## 2018-09-26 VITALS — Ht 65.5 in | Wt 150.0 lb

## 2018-09-26 DIAGNOSIS — K50018 Crohn's disease of small intestine with other complication: Secondary | ICD-10-CM

## 2018-09-26 DIAGNOSIS — Z86010 Personal history of colonic polyps: Secondary | ICD-10-CM

## 2018-09-26 MED ORDER — NA SULFATE-K SULFATE-MG SULF 17.5-3.13-1.6 GM/177ML PO SOLN: 1.0000 | Freq: Once | ORAL | 0 refills | Status: AC

## 2018-09-26 MED ORDER — METOCLOPRAMIDE HCL 10 MG PO TABS: 10.0000 mg | ORAL_TABLET | ORAL | 0 refills | Status: DC

## 2018-09-26 NOTE — Progress Notes (Signed)
No egg or soy allergy known to patient  No issues with past sedation with any surgeries  or procedures, no intubation problems  No diet pills per patient No home 02 use per patient  No blood thinners per patient  Pt denies issues with constipation  No A fib or A flutter  EMMI video sent to pt's e mail  Pt states she was told Dr Hilarie Fredrickson said she could have anti nausea meds for colon  Prep- Verbal order from Dr Hilarie Fredrickson Reglan 10 mg 1 hour prior to each prep dose   Pt verified name, DOB, address and insurance during PV today. Pt mailed instruction packet to included paper to complete and mail back to [facility] with addressed and stamped envelope, Emmi video, copy of consent form to read and not return, and instructions.  PV completed over the phone. Pt encouraged to call with questions or issues    Pt is aware that care partner will wait in the car during parking lot; if they feel like they will be too hot to wait in the car; they may wait in the lobby.  We want them to wear a mask (we do not have any that we can provide them), practice social distancing, and we will check their temperatures when they get here.  I did remind patient that their care partner needs to stay in the parking lot the entire time. Pt will wear mask into building

## 2018-10-09 ENCOUNTER — Telehealth: Payer: Self-pay | Admitting: Internal Medicine

## 2018-10-09 NOTE — Telephone Encounter (Signed)
Covid-19 Screening Questions Do you now or have you had a fever in the last 14 days? No Do you have any respiratory symptoms of shortness of breath or cough now or in the last 14 days? No Do you have any family members or close contacts with diagnosed or suspected Covid-19 in the past 14 days? No Have you been tested for Covid-19 and found to be positive? No Pt made aware of that care partner may come to the lobby during the procedure but will need to provide their own mask and was asked to bring one if available

## 2018-10-10 ENCOUNTER — Encounter: Payer: Self-pay | Admitting: Internal Medicine

## 2018-10-10 ENCOUNTER — Ambulatory Visit (AMBULATORY_SURGERY_CENTER): Payer: Medicare Other | Admitting: Internal Medicine

## 2018-10-10 ENCOUNTER — Other Ambulatory Visit: Payer: Self-pay

## 2018-10-10 VITALS — BP 141/69 | HR 70 | Temp 98.2°F | Resp 21 | Ht 65.5 in | Wt 152.0 lb

## 2018-10-10 DIAGNOSIS — K5000 Crohn's disease of small intestine without complications: Secondary | ICD-10-CM

## 2018-10-10 DIAGNOSIS — Z86010 Personal history of colonic polyps: Secondary | ICD-10-CM

## 2018-10-10 DIAGNOSIS — E7800 Pure hypercholesterolemia, unspecified: Secondary | ICD-10-CM | POA: Diagnosis not present

## 2018-10-10 MED ORDER — SODIUM CHLORIDE 0.9 % IV SOLN: 500.0000 mL | Freq: Once | INTRAVENOUS | Status: DC

## 2018-10-10 NOTE — Progress Notes (Signed)
Pt. Reports no change in her medical or surgical history since her pre-visit 09/26/2018.  Temp checked by [place].  VS by JB.

## 2018-10-10 NOTE — Patient Instructions (Signed)
YOU HAD AN ENDOSCOPIC PROCEDURE TODAY AT [facility]:   Refer to the procedure report that was given to you for any specific questions about what was found during the examination.  If the procedure report does not answer your questions, please call your gastroenterologist to clarify.  If you requested that your care partner not be given the details of your procedure findings, then the procedure report has been included in a sealed envelope for you to review at your convenience later.  YOU SHOULD EXPECT: Some feelings of bloating in the abdomen. Passage of more gas than usual.  Walking can help get rid of the air that was put into your GI tract during the procedure and reduce the bloating. If you had a lower endoscopy (such as a colonoscopy or flexible sigmoidoscopy) you may notice spotting of blood in your stool or on the toilet paper. If you underwent a bowel prep for your procedure, you may not have a normal bowel movement for a few days.  Please Note:  You might notice some irritation and congestion in your nose or some drainage.  This is from the oxygen used during your procedure.  There is no need for concern and it should clear up in a day or so.  SYMPTOMS TO REPORT IMMEDIATELY:   Following lower endoscopy (colonoscopy or flexible sigmoidoscopy):  Excessive amounts of blood in the stool  Significant tenderness or worsening of abdominal pains  Swelling of the abdomen that is new, acute  Fever of 100F or higher   For urgent or emergent issues, a gastroenterologist can be reached at any hour by calling [phone].   DIET:  We do recommend a small meal at first, but then you may proceed to your regular diet.  Drink plenty of fluids but you should avoid alcoholic beverages for 24 hours.  ACTIVITY:  You should plan to take it easy for the rest of today and you should NOT DRIVE or use heavy machinery until tomorrow (because of the sedation medicines used during the test).     FOLLOW UP: Our staff will call the number listed on your records 48-72 hours following your procedure to check on you and address any questions or concerns that you may have regarding the information given to you following your procedure. If we do not reach you, we will leave a message.  We will attempt to reach you two times.  During this call, we will ask if you have developed any symptoms of COVID 19. If you develop any symptoms (ie: fever, flu-like symptoms, shortness of breath, cough etc.) before then, please call [phone].  If you test positive for Covid 19 in the 2 weeks post procedure, please call and report this information to [place].    If any biopsies were taken you will be contacted by phone or by letter within the next 1-3 weeks.  Please call us at [phone] if you have not heard about the biopsies in 3 weeks.    SIGNATURES/CONFIDENTIALITY: You and/or your care partner have signed paperwork which will be entered into your electronic medical record.  These signatures attest to the fact that that the information above on your After Visit Summary has been reviewed and is understood.  Full responsibility of the confidentiality of this discharge information lies with you and/or your care-partner.    Handout was given to you on diverticulosis. You may resume your current medications today. Repeat colonoscopy in 5 years for surveillance. Please call if  any questions or concerns.   

## 2018-10-10 NOTE — Op Note (Signed)
[place] Patient Name: Bridget Reyes Procedure Date: 10/10/2018 10:27 AM MRN: [id number] Endoscopist: Jerene Bears , MD Age: 73 Referring MD:  Date of Birth: June 15, 1945 Gender: Female Account #: [id number] Procedure:                Colonoscopy Indications:              High risk colon cancer surveillance: Personal                            history of sessile serrated colon polyp (10 mm or                            greater in size), Last colonoscopy 3 years ago Medicines:                Monitored Anesthesia Care Procedure:                Pre-Anesthesia Assessment:                           - Prior to the procedure, a History and Physical                            was performed, and patient medications and                            allergies were reviewed. The patient's tolerance of                            previous anesthesia was also reviewed. The risks                            and benefits of the procedure and the sedation                            options and risks were discussed with the patient.                            All questions were answered, and informed consent                            was obtained. Prior Anticoagulants: The patient has                            taken no previous anticoagulant or antiplatelet                            agents. ASA Grade Assessment: II - A patient with                            mild systemic disease. After reviewing the risks                            and benefits, the patient was deemed in  satisfactory condition to undergo the procedure.                           After obtaining informed consent, the colonoscope                            was passed under direct vision. Throughout the                            procedure, the patient's blood pressure, pulse, and                            oxygen saturations were monitored continuously. The                            Colonoscope was  introduced through the anus and                            advanced to the cecum, identified by appendiceal                            orifice and ileocecal valve. The colonoscopy was                            performed without difficulty. The patient tolerated                            the procedure well. The quality of the bowel                            preparation was good. The terminal ileum, ileocecal                            valve, appendiceal orifice, and rectum were                            photographed. Scope In: 10:34:47 AM Scope Out: 10:52:18 AM Scope Withdrawal Time: 0 hours 13 minutes 0 seconds  Total Procedure Duration: 0 hours 17 minutes 31 seconds  Findings:                 The terminal ileum contained a few localized                            erosions. Given history, likely mild Crohn's                            inflammation.                           Multiple medium-mouthed diverticula were found in                            the sigmoid colon.  The exam was otherwise without abnormality on                            direct and retroflexion views. No polyps seen. Complications:            No immediate complications. Estimated Blood Loss:     Estimated blood loss: none. Impression:               - A few erosions in the terminal ileum consistent                            with very mild ileal Crohn's disease.                           - Diverticulosis in the sigmoid colon.                           - The examination was otherwise normal on direct                            and retroflexion views.                           - No specimens collected. Recommendation:           - Patient has a contact number available for                            emergencies. The signs and symptoms of potential                            delayed complications were discussed with the                            patient. Return to normal activities tomorrow.                             Written discharge instructions were provided to the                            patient.                           - Resume previous diet.                           - Continue present medications.                           - Repeat colonoscopy in 5 years for surveillance. Jerene Bears, MD 10/10/2018 11:00:34 AM This report has been signed electronically.

## 2018-10-10 NOTE — Progress Notes (Signed)
Report given to PACU, vss 

## 2018-10-10 NOTE — Progress Notes (Signed)
No problems noted in the recovery room. maw 

## 2018-10-11 ENCOUNTER — Other Ambulatory Visit: Payer: Self-pay | Admitting: Obstetrics & Gynecology

## 2018-10-11 DIAGNOSIS — Z9289 Personal history of other medical treatment: Secondary | ICD-10-CM

## 2018-10-12 ENCOUNTER — Telehealth: Payer: Self-pay

## 2018-10-12 NOTE — Telephone Encounter (Signed)
  Follow up Call-  Call back number 10/10/2018  Post procedure Call Back phone  # [phone]  Permission to leave phone message Yes  Some recent data might be hidden    1. Have you developed a fever since your procedure? no  2.   Have you had an respiratory symptoms (SOB or cough) since your procedure? no  3.   Have you tested positive for COVID 19 since your procedure no  4.   Have you had any family members/close contacts diagnosed with the COVID 19 since your procedure?  no   If yes to any of these questions please route to Joylene John, RN and Alphonsa Gin, Therapist, sports.  Patient questions:  Do you have a fever, pain , or abdominal swelling? No. Pain Score  0 *  Have you tolerated food without any problems? Yes.  Have you been able to return to your normal activities? Yes.    Do you have any questions about your discharge instructions: Diet   No. Medications  No. Follow up visit  No.  Do you have questions or concerns about your Care? No.  Actions: * If pain score is 4 or above: No action needed, pain <4.

## 2018-11-23 ENCOUNTER — Ambulatory Visit
Admission: RE | Admit: 2018-11-23 | Discharge: 2018-11-23 | Disposition: A | Discharge status: Home / Self Care | Payer: Medicare Other | Source: Ambulatory Visit | Attending: Obstetrics & Gynecology | Admitting: Obstetrics & Gynecology

## 2018-11-23 ENCOUNTER — Other Ambulatory Visit: Payer: Self-pay

## 2018-11-23 DIAGNOSIS — Z9289 Personal history of other medical treatment: Secondary | ICD-10-CM

## 2018-11-23 DIAGNOSIS — Z1231 Encounter for screening mammogram for malignant neoplasm of breast: Secondary | ICD-10-CM | POA: Diagnosis not present

## 2018-12-18 NOTE — Progress Notes (Signed)
Chief Complaint  Patient presents with  . Annual Exam    Pt has no concerns today     HPI: Bridget Reyes 73 y.o. come in for Chronic disease management  Yearly visit   Concern about anxiety   Family issues  Daughter has breast cancer  Age 65  3grand  kids with toyurettes .   Sleep ongoing issues from anxiety  Trying all the sleep hygiene   fungus right ear .   Reoccurs  .    Hearing aids   Triggers? Sometimes makes feel bad cause of claustrophobia  noit sure but felt sertraline could have cause a hiar shedding but happens without sertraline     HH of  2 no pets  TAD:  No td  One a week  Sleep  4-5  hours Exercise treadmill not as good.  ROS: See pertinent positives and negatives per HPI. Gets burning balls of feet at times  Hx gout  No falre but arch of foot  Is sore  Past Medical History:  Diagnosis Date  . Allergy   . Anxiety    not treated   . Arthritis    knees, neck - no meds  . Cataract    bilateral - MD just watchning  . Colon polyps   . Crohn's   . Eczema   . Esophageal reflux   . Gastritis   . Gout   . Hiatal hernia   . Hyperlipemia   . Ileitis   . Migraines    tx with naproxen - last one 4 yrs ago  . Rosacea   . Skin cancer of face    scca removed     Family History  Problem Relation Age of Onset  . Parkinsonism Mother   . Lung cancer Mother   . Heart disease Father        died chf in 17 s  . Hypertension Father   . Congestive Heart Failure Father   . Breast cancer Cousin   . Colon polyps Sister   . Colon cancer Neg Hx   . Esophageal cancer Neg Hx   . Rectal cancer Neg Hx   . Stomach cancer Neg Hx     Social History   Socioeconomic History  . Marital status: Married    Spouse name: Not on file  . Number of children: 2  . Years of education: Not on file  . Highest education level: Not on file  Occupational History  . Occupation: retired     Comment: Games developer: UNEMPLOYED  Social Needs  . Financial resource  strain: Not on file  . Food insecurity    Worry: Not on file    Inability: Not on file  . Transportation needs    Medical: Not on file    Non-medical: Not on file  Tobacco Use  . Smoking status: Never Smoker  . Smokeless tobacco: Never Used  Substance and Sexual Activity  . Alcohol use: Yes    Alcohol/week: 1.0 - 2.0 standard drinks    Types: 1 - 2 Glasses of wine per week    Comment: occ has a mixed drink, and wine   . Drug use: No  . Sexual activity: Yes    Partners: Male    Birth control/protection: Surgical    Comment: hysterectomy  Lifestyle  . Physical activity    Days per week: Not on file    Minutes per session: Not on file  .  Stress: Not on file  Relationships  . Social Herbalist on phone: Not on file    Gets together: Not on file    Attends religious service: Not on file    Active member of club or organization: Not on file    Attends meetings of clubs or organizations: Not on file    Relationship status: Not on file  Other Topics Concern  . Not on file  Social History Narrative   hh  Of 2       Dog    Married    G2P2    Outpatient Medications Prior to Visit  Medication Sig Dispense Refill  . acetaminophen (TYLENOL) 500 MG tablet Take 500 mg by mouth.    Marland Kitchen acetic acid-hydrocortisone (VOSOL-HC) OTIC solution Pt uses as needed    . fluticasone (FLONASE) 50 MCG/ACT nasal spray Place 2 sprays into the nose daily. 16 g 11  . mesalamine (PENTASA) 500 MG CR capsule TAKE 3 CAPSULES BY MOUTH TWICE DAILY 540 capsule 3  . metoCLOPramide (REGLAN) 10 MG tablet Take 1 tablet (10 mg total) by mouth as directed. 2 tablet 0  . Misc. Devices (VAGINAL SUPPOSITORY APPLICATOR) MISC by Does not apply route. Vitamin E As needed    . MULTIPLE VITAMIN PO Take 1 tablet by mouth daily.     . NON FORMULARY as needed. CBD Cream- applies prn to muscles, joints    . NONFORMULARY OR COMPOUNDED ITEM 200u/ml.  One pv three times weekly.k  #36/4RF 36 each 4  . Omega-3 1000 MG  CAPS Take by mouth daily.    Marland Kitchen omeprazole (PRILOSEC) 40 MG capsule Take 1 capsule (40 mg total) by mouth daily. 90 capsule 3  . Probiotic Product (CULTURELLE PROBIOTICS PO) Take by mouth daily.    . cyclobenzaprine (FLEXERIL) 5 MG tablet Take 1 tablet (5 mg total) by mouth 2 (two) times daily as needed for muscle spasms. 20 tablet 1  . simvastatin (ZOCOR) 40 MG tablet TAKE 1 TABLET(40 MG) BY MOUTH AT BEDTIME 90 tablet 3  . valACYclovir (VALTREX) 1000 MG tablet Take 1 tablet (1,000 mg total) by mouth daily as needed. 30 tablet 11  . Azelaic Acid (FINACEA) 15 % cream After skin is thoroughly washed and patted dry, gently but thoroughly massage a thin film of azelaic acid cream into the affected area twice daily, in the morning and evening. (Patient not taking: Reported on 10/10/2018) 50 g 11  . Estradiol 10 MCG TABS vaginal tablet INSERT ONE TABLET IN THE VAGINA AS DIRECTED (Patient not taking: Reported on 09/26/2018) 24 tablet 0  . ipratropium (ATROVENT) 0.06 % nasal spray instill 2 sprays into each nostril twice a day to three times a day (Patient not taking: Reported on 12/19/2018) 15 mL prn   No facility-administered medications prior to visit.      EXAM:  BP 124/62 (BP Location: Right Arm, Patient Position: Sitting, Cuff Size: Normal)   Pulse 77   Temp (!) 97.4 F (36.3 C) (Temporal)   Ht 5' 6"  (1.676 m)   Wt 154 lb 12.8 oz (70.2 kg)   LMP 04/19/1992   SpO2 97%   BMI 24.99 kg/m   Body mass index is 24.99 kg/m.  GENERAL: vitals reviewed and listed above, alert, oriented, appears well hydrated and in no acute distress HEENT: atraumatic, conjunctiva  clear, no obvious abnormalities on inspection of external nose and earstms clear  Min debri in righ tcanal  OP : masked  NECK:  no obvious masses on inspection palpation  LUNGS: clear to auscultation bilaterally, no wheezes, rales or rhonchi, good air movement CV: HRRR, no clubbing cyanosis or  peripheral edema nl cap refill  Abdomen:  Sof,t  normal bowel sounds without hepatosplenomegaly, no guarding rebound or masses no CVA tenderness  MS: moves all extremities without noticeable focal  abnormality PSYCH: pleasant and cooperative, no obvious depression or anxiety Lab Results  Component Value Date   WBC 5.9 12/19/2018   HGB 14.1 12/19/2018   HCT 41.2 12/19/2018   PLT 219.0 12/19/2018   GLUCOSE 90 12/19/2018   CHOL 155 12/19/2018   TRIG 147.0 12/19/2018   HDL 43.30 12/19/2018   LDLCALC 82 12/19/2018   ALT 25 12/19/2018   AST 21 12/19/2018   NA 139 12/19/2018   K 4.3 12/19/2018   CL 101 12/19/2018   CREATININE 0.79 12/19/2018   BUN 17 12/19/2018   CO2 30 12/19/2018   TSH 4.47 12/19/2018   HGBA1C 5.6 12/19/2018   BP Readings from Last 3 Encounters:  12/19/18 124/62  10/10/18 (!) 141/69  06/08/18 132/70   PHQ-SADS Somatic:8   Tired  GAD7: 12 no attacks  worry  irritable PHQ9:12  Tired and sleep  Interest  Difficulty : not x sleep Last b12 was nl 2017   ASSESSMENT AND PLAN:  Discussed the following assessment and plan:  Hyperlipidemia, unspecified hyperlipidemia type - Plan: Basic metabolic panel, CBC with Differential/Platelet, TSH, T4, free, Lipid panel, Hepatic function panel  Medication management - Plan: Basic metabolic panel, CBC with Differential/Platelet, TSH, T4, free, Lipid panel, Hepatic function panel  Fasting hyperglycemia - Plan: Basic metabolic panel, CBC with Differential/Platelet, TSH, T4, free, Lipid panel, Hepatic function panel, Hemoglobin A1c  Hearing aid worn - Plan: Basic metabolic panel, CBC with Differential/Platelet, TSH, T4, free, Lipid panel, Hepatic function panel  History of gout - Plan: Basic metabolic panel, CBC with Differential/Platelet, TSH, T4, free, Lipid panel, Hepatic function panel, Uric Acid  Crohn's disease of both small and large intestine without complication (HCC) - Plan: Basic metabolic panel, CBC with Differential/Platelet, TSH, T4, free, Lipid panel, Hepatic  function panel Anxiety worry external triggers adding to habit sleep problems   Willing to try a different med  Will follow .   consider other meds if needed such as silenor ?  But for now add low dose citalopram   No sig ia at low dose ( prilosec)  -Patient advised to return or notify health care team  if  new concerns arise.  Patient Instructions  Will notify you  of labs when available.   Suggest specific counseling  Related to the worry and  External concerns and anxiety . I suggest we try anoither controller medication also in the interim .  Plan  rov virtual visit ok in about 3 -4 weeks into medication.  Then go from there.   Try to get some activity exercise to help with the night sleep wakening .     Living With Anxiety  After being diagnosed with an anxiety disorder, you may be relieved to know why you have felt or behaved a certain way. It is natural to also feel overwhelmed about the treatment ahead and what it will mean for your life. With care and support, you can manage this condition and recover from it. How to cope with anxiety Dealing with stress Stress is your body's reaction to life changes and events, both good and bad. Stress can last just a few hours or  it can be ongoing. Stress can play a major role in anxiety, so it is important to learn both how to cope with stress and how to think about it differently. Talk with your health care provider or a counselor to learn more about stress reduction. He or she may suggest some stress reduction techniques, such as:  Music therapy. This can include creating or listening to music that you enjoy and that inspires you.  Mindfulness-based meditation. This involves being aware of your normal breaths, rather than trying to control your breathing. It can be done while sitting or walking.  Centering prayer. This is a kind of meditation that involves focusing on a word, phrase, or sacred image that is meaningful to you and that  brings you peace.  Deep breathing. To do this, expand your stomach and inhale slowly through your nose. Hold your breath for 3-5 seconds. Then exhale slowly, allowing your stomach muscles to relax.  Self-talk. This is a skill where you identify thought patterns that lead to anxiety reactions and correct those thoughts.  Muscle relaxation. This involves tensing muscles then relaxing them. Choose a stress reduction technique that fits your lifestyle and personality. Stress reduction techniques take time and practice. Set aside 5-15 minutes a day to do them. Therapists can offer training in these techniques. The training may be covered by some insurance plans. Other things you can do to manage stress include:  Keeping a stress diary. This can help you learn what triggers your stress and ways to control your response.  Thinking about how you respond to certain situations. You may not be able to control everything, but you can control your reaction.  Making time for activities that help you relax, and not feeling guilty about spending your time in this way. Therapy combined with coping and stress-reduction skills provides the best chance for successful treatment. Medicines Medicines can help ease symptoms. Medicines for anxiety include:  Anti-anxiety drugs.  Antidepressants.  Beta-blockers. Medicines may be used as the main treatment for anxiety disorder, along with therapy, or if other treatments are not working. Medicines should be prescribed by a health care provider. Relationships Relationships can play a big part in helping you recover. Try to spend more time connecting with trusted friends and family members. Consider going to couples counseling, taking family education classes, or going to family therapy. Therapy can help you and others better understand the condition. How to recognize changes in your condition Everyone has a different response to treatment for anxiety. Recovery from  anxiety happens when symptoms decrease and stop interfering with your daily activities at home or work. This may mean that you will start to:  Have better concentration and focus.  Sleep better.  Be less irritable.  Have more energy.  Have improved memory. It is important to recognize when your condition is getting worse. Contact your health care provider if your symptoms interfere with home or work and you do not feel like your condition is improving. Where to find help and support: You can get help and support from these sources:  Self-help groups.  Online and [employer].  A trusted spiritual leader.  Couples counseling.  Family education classes.  Family therapy. Follow these instructions at home:  Eat a healthy diet that includes plenty of vegetables, fruits, whole grains, low-fat dairy products, and lean protein. Do not eat a lot of foods that are high in solid fats, added sugars, or salt.  Exercise. Most adults should do the following: ?  Exercise for at least 150 minutes each week. The exercise should increase your heart rate and make you sweat (moderate-intensity exercise). ? Strengthening exercises at least twice a week.  Cut down on caffeine, tobacco, alcohol, and other potentially harmful substances.  Get the right amount and quality of sleep. Most adults need 7-9 hours of sleep each night.  Make choices that simplify your life.  Take over-the-counter and prescription medicines only as told by your health care provider.  Avoid caffeine, alcohol, and certain over-the-counter cold medicines. These may make you feel worse. Ask your pharmacist which medicines to avoid.  Keep all follow-up visits as told by your health care provider. This is important. Questions to ask your health care provider  Would I benefit from therapy?  How often should I follow up with a health care provider?  How long do I need to take medicine?  Are there any long-term  side effects of my medicine?  Are there any alternatives to taking medicine? Contact a health care provider if:  You have a hard time staying focused or finishing daily tasks.  You spend many hours a day feeling worried about everyday life.  You become exhausted by worry.  You start to have headaches, feel tense, or have nausea.  You urinate more than normal.  You have diarrhea. Get help right away if:  You have a racing heart and shortness of breath.  You have thoughts of hurting yourself or others. If you ever feel like you may hurt yourself or others, or have thoughts about taking your own life, get help right away. You can go to your nearest emergency department or call:  Your local emergency services (911 in the [place]  A suicide crisis helpline, such as the [facility] at 270-841-3667. This is open 24-hours a day. Summary  Taking steps to deal with stress can help calm you.  Medicines cannot cure anxiety disorders, but they can help ease symptoms.  Family, friends, and partners can play a big part in helping you recover from an anxiety disorder. This information is not intended to replace advice given to you by your health care provider. Make sure you discuss any questions you have with your health care provider. Document Released: 03/30/2016 Document Revised: 03/18/2017 Document Reviewed: 03/30/2016 Elsevier Patient Education  2020 [employer]leton Panosh M.D.

## 2018-12-19 ENCOUNTER — Other Ambulatory Visit: Payer: Self-pay

## 2018-12-19 ENCOUNTER — Encounter: Payer: Self-pay | Admitting: Internal Medicine

## 2018-12-19 ENCOUNTER — Ambulatory Visit (INDEPENDENT_AMBULATORY_CARE_PROVIDER_SITE_OTHER): Payer: Medicare Other | Admitting: Internal Medicine

## 2018-12-19 VITALS — BP 124/62 | HR 77 | Temp 97.4°F | Ht 66.0 in | Wt 154.8 lb

## 2018-12-19 DIAGNOSIS — Z79899 Other long term (current) drug therapy: Secondary | ICD-10-CM

## 2018-12-19 DIAGNOSIS — Z8739 Personal history of other diseases of the musculoskeletal system and connective tissue: Secondary | ICD-10-CM

## 2018-12-19 DIAGNOSIS — K5080 Crohn's disease of both small and large intestine without complications: Secondary | ICD-10-CM

## 2018-12-19 DIAGNOSIS — E785 Hyperlipidemia, unspecified: Secondary | ICD-10-CM | POA: Diagnosis not present

## 2018-12-19 DIAGNOSIS — Z974 Presence of external hearing-aid: Secondary | ICD-10-CM

## 2018-12-19 DIAGNOSIS — R7301 Impaired fasting glucose: Secondary | ICD-10-CM | POA: Diagnosis not present

## 2018-12-19 LAB — HEPATIC FUNCTION PANEL
ALT: 25 U/L (ref 0–35)
AST: 21 U/L (ref 0–37)
Albumin: 4.5 g/dL (ref 3.5–5.2)
Alkaline Phosphatase: 79 U/L (ref 39–117)
Bilirubin, Direct: 0.1 mg/dL (ref 0.0–0.3)
Total Bilirubin: 0.4 mg/dL (ref 0.2–1.2)
Total Protein: 7.2 g/dL (ref 6.0–8.3)

## 2018-12-19 LAB — CBC WITH DIFFERENTIAL/PLATELET
Basophils Absolute: 0.1 10*3/uL (ref 0.0–0.1)
Basophils Relative: 1 % (ref 0.0–3.0)
Eosinophils Absolute: 0.1 10*3/uL (ref 0.0–0.7)
Eosinophils Relative: 2.5 % (ref 0.0–5.0)
HCT: 41.2 % (ref 36.0–46.0)
Hemoglobin: 14.1 g/dL (ref 12.0–15.0)
Lymphocytes Relative: 21.3 % (ref 12.0–46.0)
Lymphs Abs: 1.3 10*3/uL (ref 0.7–4.0)
MCHC: 34.3 g/dL (ref 30.0–36.0)
MCV: 93.5 fl (ref 78.0–100.0)
Monocytes Absolute: 0.5 10*3/uL (ref 0.1–1.0)
Monocytes Relative: 8.8 % (ref 3.0–12.0)
Neutro Abs: 3.9 10*3/uL (ref 1.4–7.7)
Neutrophils Relative %: 66.4 % (ref 43.0–77.0)
Platelets: 219 10*3/uL (ref 150.0–400.0)
RBC: 4.41 Mil/uL (ref 3.87–5.11)
RDW: 12.3 % (ref 11.5–15.5)
WBC: 5.9 10*3/uL (ref 4.0–10.5)

## 2018-12-19 LAB — BASIC METABOLIC PANEL
BUN: 17 mg/dL (ref 6–23)
CO2: 30 mEq/L (ref 19–32)
Calcium: 9.6 mg/dL (ref 8.4–10.5)
Chloride: 101 mEq/L (ref 96–112)
Creatinine, Ser: 0.79 mg/dL (ref 0.40–1.20)
GFR: 71.2 mL/min (ref >60.00)
Glucose, Bld: 90 mg/dL (ref 70–99)
Potassium: 4.3 mEq/L (ref 3.5–5.1)
Sodium: 139 mEq/L (ref 135–145)

## 2018-12-19 LAB — LIPID PANEL
Cholesterol: 155 mg/dL (ref 0–200)
HDL: 43.3 mg/dL (ref >39.00)
LDL Cholesterol: 82 mg/dL (ref 0–99)
NonHDL: 111.89
Total CHOL/HDL Ratio: 4
Triglycerides: 147 mg/dL (ref 0.0–149.0)
VLDL: 29.4 mg/dL (ref 0.0–40.0)

## 2018-12-19 LAB — TSH: TSH: 4.47 u[IU]/mL (ref 0.35–4.50)

## 2018-12-19 LAB — T4, FREE: Free T4: 0.71 ng/dL (ref 0.60–1.60)

## 2018-12-19 LAB — HEMOGLOBIN A1C: Hgb A1c MFr Bld: 5.6 % (ref 4.6–6.5)

## 2018-12-19 LAB — URIC ACID: Uric Acid, Serum: 3.9 mg/dL (ref 2.4–7.0)

## 2018-12-19 MED ORDER — IPRATROPIUM BROMIDE 0.06 % NA SOLN: NASAL | 99 refills | Status: DC

## 2018-12-19 MED ORDER — CYCLOBENZAPRINE HCL 5 MG PO TABS: 5.0000 mg | ORAL_TABLET | Freq: Two times a day (BID) | ORAL | 0 refills | Status: DC | PRN

## 2018-12-19 MED ORDER — VALACYCLOVIR HCL 1 G PO TABS: 1000.0000 mg | ORAL_TABLET | Freq: Every day | ORAL | 11 refills | Status: DC | PRN

## 2018-12-19 MED ORDER — CITALOPRAM HYDROBROMIDE 10 MG PO TABS: 10.0000 mg | ORAL_TABLET | Freq: Every day | ORAL | 2 refills | Status: DC

## 2018-12-19 MED ORDER — SIMVASTATIN 40 MG PO TABS: ORAL_TABLET | ORAL | 3 refills | Status: DC

## 2018-12-19 NOTE — Patient Instructions (Addendum)
Will notify you  of labs when available.   Suggest specific counseling  Related to the worry and  External concerns and anxiety . I suggest we try anoither controller medication also in the interim .  Plan  rov virtual visit ok in about 3 -4 weeks into medication.  Then go from there.   Try to get some activity exercise to help with the night sleep wakening .     Living With Anxiety  After being diagnosed with an anxiety disorder, you may be relieved to know why you have felt or behaved a certain way. It is natural to also feel overwhelmed about the treatment ahead and what it will mean for your life. With care and support, you can manage this condition and recover from it. How to cope with anxiety Dealing with stress Stress is your body's reaction to life changes and events, both good and bad. Stress can last just a few hours or it can be ongoing. Stress can play a major role in anxiety, so it is important to learn both how to cope with stress and how to think about it differently. Talk with your health care provider or a counselor to learn more about stress reduction. He or she may suggest some stress reduction techniques, such as:  Music therapy. This can include creating or listening to music that you enjoy and that inspires you.  Mindfulness-based meditation. This involves being aware of your normal breaths, rather than trying to control your breathing. It can be done while sitting or walking.  Centering prayer. This is a kind of meditation that involves focusing on a word, phrase, or sacred image that is meaningful to you and that brings you peace.  Deep breathing. To do this, expand your stomach and inhale slowly through your nose. Hold your breath for 3-5 seconds. Then exhale slowly, allowing your stomach muscles to relax.  Self-talk. This is a skill where you identify thought patterns that lead to anxiety reactions and correct those thoughts.  Muscle relaxation. This involves  tensing muscles then relaxing them. Choose a stress reduction technique that fits your lifestyle and personality. Stress reduction techniques take time and practice. Set aside 5-15 minutes a day to do them. Therapists can offer training in these techniques. The training may be covered by some insurance plans. Other things you can do to manage stress include:  Keeping a stress diary. This can help you learn what triggers your stress and ways to control your response.  Thinking about how you respond to certain situations. You may not be able to control everything, but you can control your reaction.  Making time for activities that help you relax, and not feeling guilty about spending your time in this way. Therapy combined with coping and stress-reduction skills provides the best chance for successful treatment. Medicines Medicines can help ease symptoms. Medicines for anxiety include:  Anti-anxiety drugs.  Antidepressants.  Beta-blockers. Medicines may be used as the main treatment for anxiety disorder, along with therapy, or if other treatments are not working. Medicines should be prescribed by a health care provider. Relationships Relationships can play a big part in helping you recover. Try to spend more time connecting with trusted friends and family members. Consider going to couples counseling, taking family education classes, or going to family therapy. Therapy can help you and others better understand the condition. How to recognize changes in your condition Everyone has a different response to treatment for anxiety. Recovery from anxiety happens when symptoms  decrease and stop interfering with your daily activities at home or work. This may mean that you will start to:  Have better concentration and focus.  Sleep better.  Be less irritable.  Have more energy.  Have improved memory. It is important to recognize when your condition is getting worse. Contact your health care  provider if your symptoms interfere with home or work and you do not feel like your condition is improving. Where to find help and support: You can get help and support from these sources:  Self-help groups.  Online and [employer].  A trusted spiritual leader.  Couples counseling.  Family education classes.  Family therapy. Follow these instructions at home:  Eat a healthy diet that includes plenty of vegetables, fruits, whole grains, low-fat dairy products, and lean protein. Do not eat a lot of foods that are high in solid fats, added sugars, or salt.  Exercise. Most adults should do the following: ? Exercise for at least 150 minutes each week. The exercise should increase your heart rate and make you sweat (moderate-intensity exercise). ? Strengthening exercises at least twice a week.  Cut down on caffeine, tobacco, alcohol, and other potentially harmful substances.  Get the right amount and quality of sleep. Most adults need 7-9 hours of sleep each night.  Make choices that simplify your life.  Take over-the-counter and prescription medicines only as told by your health care provider.  Avoid caffeine, alcohol, and certain over-the-counter cold medicines. These may make you feel worse. Ask your pharmacist which medicines to avoid.  Keep all follow-up visits as told by your health care provider. This is important. Questions to ask your health care provider  Would I benefit from therapy?  How often should I follow up with a health care provider?  How long do I need to take medicine?  Are there any long-term side effects of my medicine?  Are there any alternatives to taking medicine? Contact a health care provider if:  You have a hard time staying focused or finishing daily tasks.  You spend many hours a day feeling worried about everyday life.  You become exhausted by worry.  You start to have headaches, feel tense, or have nausea.  You urinate more  than normal.  You have diarrhea. Get help right away if:  You have a racing heart and shortness of breath.  You have thoughts of hurting yourself or others. If you ever feel like you may hurt yourself or others, or have thoughts about taking your own life, get help right away. You can go to your nearest emergency department or call:  Your local emergency services (911 in the [place]  A suicide crisis helpline, such as the [facility] at (772)823-6900. This is open 24-hours a day. Summary  Taking steps to deal with stress can help calm you.  Medicines cannot cure anxiety disorders, but they can help ease symptoms.  Family, friends, and partners can play a big part in helping you recover from an anxiety disorder. This information is not intended to replace advice given to you by your health care provider. Make sure you discuss any questions you have with your health care provider. Document Released: 03/30/2016 Document Revised: 03/18/2017 Document Reviewed: 03/30/2016 Elsevier Patient Education  2020 [employer].

## 2019-01-09 DIAGNOSIS — H6240 Otitis externa in other diseases classified elsewhere, unspecified ear: Secondary | ICD-10-CM | POA: Diagnosis not present

## 2019-01-09 DIAGNOSIS — B369 Superficial mycosis, unspecified: Secondary | ICD-10-CM | POA: Diagnosis not present

## 2019-01-09 DIAGNOSIS — H903 Sensorineural hearing loss, bilateral: Secondary | ICD-10-CM | POA: Diagnosis not present

## 2019-01-09 DIAGNOSIS — J310 Chronic rhinitis: Secondary | ICD-10-CM | POA: Diagnosis not present

## 2019-01-12 DIAGNOSIS — Z23 Encounter for immunization: Secondary | ICD-10-CM | POA: Diagnosis not present

## 2019-01-16 ENCOUNTER — Other Ambulatory Visit: Payer: Self-pay

## 2019-01-16 ENCOUNTER — Telehealth (INDEPENDENT_AMBULATORY_CARE_PROVIDER_SITE_OTHER): Payer: Medicare Other | Admitting: Internal Medicine

## 2019-01-16 ENCOUNTER — Encounter: Payer: Self-pay | Admitting: Internal Medicine

## 2019-01-16 DIAGNOSIS — Z79899 Other long term (current) drug therapy: Secondary | ICD-10-CM

## 2019-01-16 DIAGNOSIS — F419 Anxiety disorder, unspecified: Secondary | ICD-10-CM

## 2019-01-16 DIAGNOSIS — Z634 Disappearance and death of family member: Secondary | ICD-10-CM

## 2019-01-16 MED ORDER — CITALOPRAM HYDROBROMIDE 10 MG PO TABS: 15.0000 mg | ORAL_TABLET | Freq: Every day | ORAL | 1 refills | Status: DC

## 2019-01-16 NOTE — Progress Notes (Signed)
Virtual Visit via Video Note  I connected with@ on 01/16/19 at  1:30 PM EDT by a video enabled telemedicine application and verified that I am speaking with the correct person using two identifiers. Location patient: home Location provider:work  office Persons participating in the virtual visit: patient, provider  [place] national recommendations  regarding COVID 19 pandemic   video visit is advised over in office visit for this patient.  Patient aware  of the limitations of evaluation and management by telemedicine and  availability of in person appointments. and agreed to proceed.   HPI: Bridget Reyes presents for video visit for fu her med rx for anxiety worry and mood   Has been on 10 mg  For 3 + weeks and  Last week had more eneery but this week not so much  Sleep disruption   However death in family in [place] adding the stress. Nose of med encountered and no worse.   Has a glass of wine at night    Can she have this ? ROS: See pertinent positives and negatives per HPI.  Past Medical History:  Diagnosis Date  . Allergy   . Anxiety    not treated   . Arthritis    knees, neck - no meds  . Cataract    bilateral - MD just watchning  . Colon polyps   . Crohn's   . Eczema   . Esophageal reflux   . Gastritis   . Gout   . Hiatal hernia   . Hyperlipemia   . Ileitis   . Migraines    tx with naproxen - last one 4 yrs ago  . Rosacea   . Skin cancer of face    scca removed     Past Surgical History:  Procedure Laterality Date  . BREAST BIOPSY Left 1980  . COLONOSCOPY  08/2015   Pyrtle -polyp  . DILATION AND CURETTAGE OF UTERUS    . POLYPECTOMY    . TONSILLECTOMY    . TOTAL VAGINAL HYSTERECTOMY  1994  . UPPER GASTROINTESTINAL ENDOSCOPY    . UPPER GI ENDOSCOPY     hiatal hernia  . WISDOM TOOTH EXTRACTION      Family History  Problem Relation Age of Onset  . Parkinsonism Mother   . Lung cancer Mother   . Heart disease Father        died chf in 28 s  . Hypertension  Father   . Congestive Heart Failure Father   . Breast cancer Cousin   . Colon polyps Sister   . Colon cancer Neg Hx   . Esophageal cancer Neg Hx   . Rectal cancer Neg Hx   . Stomach cancer Neg Hx     Social History   Tobacco Use  . Smoking status: Never Smoker  . Smokeless tobacco: Never Used  Substance Use Topics  . Alcohol use: Yes    Alcohol/week: 1.0 - 2.0 standard drinks    Types: 1 - 2 Glasses of wine per week    Comment: occ has a mixed drink, and wine   . Drug use: No      Current Outpatient Medications:  .  acetaminophen (TYLENOL) 500 MG tablet, Take 500 mg by mouth., Disp: , Rfl:  .  acetic acid-hydrocortisone (VOSOL-HC) OTIC solution, Pt uses as needed, Disp: , Rfl:  .  Azelaic Acid (FINACEA) 15 % cream, After skin is thoroughly washed and patted dry, gently but thoroughly massage a thin film of azelaic  acid cream into the affected area twice daily, in the morning and evening. (Patient not taking: Reported on 10/10/2018), Disp: 50 g, Rfl: 11 .  citalopram (CELEXA) 10 MG tablet, Take 1.5 tablets (15 mg total) by mouth daily. May Increase to 20 mg per day after 1 week as directed, Disp: 60 tablet, Rfl: 1 .  cyclobenzaprine (FLEXERIL) 5 MG tablet, Take 1 tablet (5 mg total) by mouth 2 (two) times daily as needed for muscle spasms., Disp: 20 tablet, Rfl: 0 .  Estradiol 10 MCG TABS vaginal tablet, INSERT ONE TABLET IN THE VAGINA AS DIRECTED (Patient not taking: Reported on 09/26/2018), Disp: 24 tablet, Rfl: 0 .  fluticasone (FLONASE) 50 MCG/ACT nasal spray, Place 2 sprays into the nose daily., Disp: 16 g, Rfl: 11 .  ipratropium (ATROVENT) 0.06 % nasal spray, instill 2 sprays into each nostril twice a day to three times a day, Disp: 15 mL, Rfl: prn .  mesalamine (PENTASA) 500 MG CR capsule, TAKE 3 CAPSULES BY MOUTH TWICE DAILY, Disp: 540 capsule, Rfl: 3 .  metoCLOPramide (REGLAN) 10 MG tablet, Take 1 tablet (10 mg total) by mouth as directed., Disp: 2 tablet, Rfl: 0 .  Misc.  Devices (VAGINAL SUPPOSITORY APPLICATOR) MISC, by Does not apply route. Vitamin E As needed, Disp: , Rfl:  .  MULTIPLE VITAMIN PO, Take 1 tablet by mouth daily. , Disp: , Rfl:  .  NON FORMULARY, as needed. CBD Cream- applies prn to muscles, joints, Disp: , Rfl:  .  NONFORMULARY OR COMPOUNDED ITEM, 200u/ml.  One pv three times weekly.k  #36/4RF, Disp: 36 each, Rfl: 4 .  Omega-3 1000 MG CAPS, Take by mouth daily., Disp: , Rfl:  .  omeprazole (PRILOSEC) 40 MG capsule, Take 1 capsule (40 mg total) by mouth daily., Disp: 90 capsule, Rfl: 3 .  Probiotic Product (CULTURELLE PROBIOTICS PO), Take by mouth daily., Disp: , Rfl:  .  simvastatin (ZOCOR) 40 MG tablet, TAKE 1 TABLET(40 MG) BY MOUTH AT BEDTIME, Disp: 90 tablet, Rfl: 3 .  valACYclovir (VALTREX) 1000 MG tablet, Take 1 tablet (1,000 mg total) by mouth daily as needed., Disp: 30 tablet, Rfl: 11  EXAM: BP Readings from Last 3 Encounters:  12/19/18 124/62  10/10/18 (!) 141/69  06/08/18 132/70    VITALS per patient if applicable: nl speech looks well    GENERAL: alert, oriented, appears well and in no acute distress HEENT: atraumatic, conjunttiva clear, no obvious abnormalities on inspection of external nose and ears NECK: normal movements of the head and neck LUNGS: on inspection no signs of respiratory distress, breathing rate appears normal, no obvious gross SOB, gasping or wheezing CV: no obvious cyanosi PSYCH/NEURO: pleasant and cooperative, no obvious depression or anxiety, speech and thought processing grossly intact  Lab Results  Component Value Date   WBC 5.9 12/19/2018   HGB 14.1 12/19/2018   HCT 41.2 12/19/2018   PLT 219.0 12/19/2018   GLUCOSE 90 12/19/2018   CHOL 155 12/19/2018   TRIG 147.0 12/19/2018   HDL 43.30 12/19/2018   LDLCALC 82 12/19/2018   ALT 25 12/19/2018   AST 21 12/19/2018   NA 139 12/19/2018   K 4.3 12/19/2018   CL 101 12/19/2018   CREATININE 0.79 12/19/2018   BUN 17 12/19/2018   CO2 30 12/19/2018    TSH 4.47 12/19/2018   HGBA1C 5.6 12/19/2018    ASSESSMENT AND PLAN:  Discussed the following assessment and plan:    ICD-10-CM   1. Anxiety  F41.9  2. Medication management  Z79.899   3. Death of family member  Z63.4    No se  And may have some minor good effect but interim external stressors  Inc to 15 mg per day with option to  increase to 20 mg per day Counseled.   Expectant management and discussion of plan and treatment with opportunity to ask questions and all were answered. The patient agreed with the plan and demonstrated an understanding of the instructions.  Return in about 3 weeks (around 02/06/2019) for 3-4 weeks  for med check . Virtual ok  Advised to call back or seek an in-person evaluation if worsening  or having  further concerns . In interim     Shanon Ace, MD

## 2019-01-24 DIAGNOSIS — H903 Sensorineural hearing loss, bilateral: Secondary | ICD-10-CM | POA: Diagnosis not present

## 2019-02-06 ENCOUNTER — Encounter: Payer: Self-pay | Admitting: Internal Medicine

## 2019-02-06 ENCOUNTER — Other Ambulatory Visit: Payer: Self-pay

## 2019-02-06 ENCOUNTER — Telehealth (INDEPENDENT_AMBULATORY_CARE_PROVIDER_SITE_OTHER): Payer: Medicare Other | Admitting: Internal Medicine

## 2019-02-06 DIAGNOSIS — Z79899 Other long term (current) drug therapy: Secondary | ICD-10-CM

## 2019-02-06 DIAGNOSIS — F419 Anxiety disorder, unspecified: Secondary | ICD-10-CM | POA: Diagnosis not present

## 2019-02-06 MED ORDER — CITALOPRAM HYDROBROMIDE 10 MG PO TABS: 15.0000 mg | ORAL_TABLET | Freq: Every day | ORAL | 0 refills | Status: DC

## 2019-02-06 NOTE — Progress Notes (Signed)
Virtual Visit via Video Note  I connected with@ on 02/06/19 at  3:00 PM EDT by a video enabled telemedicine application and verified that I am speaking with the correct person using two identifiers. Location patient: home Location provider:work  office Persons participating in the virtual visit: patient, provider  [place] national recommendations  regarding COVID 19 pandemic   video visit is advised over in office visit for this patient.  Patient aware  of the limitations of evaluation and management by telemedicine and  availability of in person appointments. and agreed to proceed.   HPI: Bridget Reyes presents for video visit in regard to medication evaluation. She has been on 15 mg of citalopram since her last visit about a month ago.  She states that she feels a lot better and feels more like herself less anxiety.  No untoward side effects reported. In regard to her sleep she still wakes up at night and is awake for almost 2 hours goes back to sleep but is able to sleep and now which is a better scenario.  She had used melatonin in the past without help nothing recently.   No panic or anxiety attacks. ROS: See pertinent positives and negatives per HPI.  Past Medical History:  Diagnosis Date  . Allergy   . Anxiety    not treated   . Arthritis    knees, neck - no meds  . Cataract    bilateral - MD just watchning  . Colon polyps   . Crohn's   . Eczema   . Esophageal reflux   . Gastritis   . Gout   . Hiatal hernia   . Hyperlipemia   . Ileitis   . Migraines    tx with naproxen - last one 4 yrs ago  . Rosacea   . Skin cancer of face    scca removed     Past Surgical History:  Procedure Laterality Date  . BREAST BIOPSY Left 1980  . COLONOSCOPY  08/2015   Pyrtle -polyp  . DILATION AND CURETTAGE OF UTERUS    . POLYPECTOMY    . TONSILLECTOMY    . TOTAL VAGINAL HYSTERECTOMY  1994  . UPPER GASTROINTESTINAL ENDOSCOPY    . UPPER GI ENDOSCOPY     hiatal hernia  .  WISDOM TOOTH EXTRACTION      Family History  Problem Relation Age of Onset  . Parkinsonism Mother   . Lung cancer Mother   . Heart disease Father        died chf in 19 s  . Hypertension Father   . Congestive Heart Failure Father   . Breast cancer Cousin   . Colon polyps Sister   . Colon cancer Neg Hx   . Esophageal cancer Neg Hx   . Rectal cancer Neg Hx   . Stomach cancer Neg Hx     Social History   Tobacco Use  . Smoking status: Never Smoker  . Smokeless tobacco: Never Used  Substance Use Topics  . Alcohol use: Yes    Alcohol/week: 1.0 - 2.0 standard drinks    Types: 1 - 2 Glasses of wine per week    Comment: occ has a mixed drink, and wine   . Drug use: No      Current Outpatient Medications:  .  acetaminophen (TYLENOL) 500 MG tablet, Take 500 mg by mouth., Disp: , Rfl:  .  acetic acid-hydrocortisone (VOSOL-HC) OTIC solution, Pt uses as needed, Disp: , Rfl:  .  Azelaic Acid (FINACEA) 15 % cream, After skin is thoroughly washed and patted dry, gently but thoroughly massage a thin film of azelaic acid cream into the affected area twice daily, in the morning and evening., Disp: 50 g, Rfl: 11 .  citalopram (CELEXA) 10 MG tablet, Take 1.5 tablets (15 mg total) by mouth daily. May Increase to 20 mg per day, Disp: 135 tablet, Rfl: 0 .  cyclobenzaprine (FLEXERIL) 5 MG tablet, Take 1 tablet (5 mg total) by mouth 2 (two) times daily as needed for muscle spasms., Disp: 20 tablet, Rfl: 0 .  Estradiol 10 MCG TABS vaginal tablet, INSERT ONE TABLET IN THE VAGINA AS DIRECTED (Patient not taking: Reported on 09/26/2018), Disp: 24 tablet, Rfl: 0 .  fluticasone (FLONASE) 50 MCG/ACT nasal spray, Place 2 sprays into the nose daily., Disp: 16 g, Rfl: 11 .  ipratropium (ATROVENT) 0.06 % nasal spray, instill 2 sprays into each nostril twice a day to three times a day, Disp: 15 mL, Rfl: prn .  mesalamine (PENTASA) 500 MG CR capsule, TAKE 3 CAPSULES BY MOUTH TWICE DAILY, Disp: 540 capsule, Rfl: 3 .   metoCLOPramide (REGLAN) 10 MG tablet, Take 1 tablet (10 mg total) by mouth as directed., Disp: 2 tablet, Rfl: 0 .  Misc. Devices (VAGINAL SUPPOSITORY APPLICATOR) MISC, by Does not apply route. Vitamin E As needed, Disp: , Rfl:  .  MULTIPLE VITAMIN PO, Take 1 tablet by mouth daily. , Disp: , Rfl:  .  NON FORMULARY, as needed. CBD Cream- applies prn to muscles, joints, Disp: , Rfl:  .  NONFORMULARY OR COMPOUNDED ITEM, 200u/ml.  One pv three times weekly.k  #36/4RF, Disp: 36 each, Rfl: 4 .  Omega-3 1000 MG CAPS, Take by mouth daily., Disp: , Rfl:  .  omeprazole (PRILOSEC) 40 MG capsule, Take 1 capsule (40 mg total) by mouth daily., Disp: 90 capsule, Rfl: 3 .  Probiotic Product (CULTURELLE PROBIOTICS PO), Take by mouth daily., Disp: , Rfl:  .  simvastatin (ZOCOR) 40 MG tablet, TAKE 1 TABLET(40 MG) BY MOUTH AT BEDTIME, Disp: 90 tablet, Rfl: 3 .  valACYclovir (VALTREX) 1000 MG tablet, Take 1 tablet (1,000 mg total) by mouth daily as needed., Disp: 30 tablet, Rfl: 11  EXAM: BP Readings from Last 3 Encounters:  12/19/18 124/62  10/10/18 (!) 141/69  06/08/18 132/70    VITALS per patient if applicable:  GENERAL: alert, oriented, appears well and in no acute distress  HEENT: atraumatic, conjunttiva clear, no obvious abnormalities on inspection of external nose and ears NECK: normal movements of the head and neck LUNGS: on inspection no signs of respiratory distress, breathing rate appears normal, no obvious gross SOB, gasping or wheezing  CV: no obvious cyanosis  PSYCH/NEURO: pleasant and cooperative, no obvious depression or anxiety, speech and thought processing grossly intact Lab Results  Component Value Date   WBC 5.9 12/19/2018   HGB 14.1 12/19/2018   HCT 41.2 12/19/2018   PLT 219.0 12/19/2018   GLUCOSE 90 12/19/2018   CHOL 155 12/19/2018   TRIG 147.0 12/19/2018   HDL 43.30 12/19/2018   LDLCALC 82 12/19/2018   ALT 25 12/19/2018   AST 21 12/19/2018   NA 139 12/19/2018   K 4.3  12/19/2018   CL 101 12/19/2018   CREATININE 0.79 12/19/2018   BUN 17 12/19/2018   CO2 30 12/19/2018   TSH 4.47 12/19/2018   HGBA1C 5.6 12/19/2018    ASSESSMENT AND PLAN:  Discussed the following assessment and plan:  ICD-10-CM   1. Anxiety  F41.9   2. Medication management  Z79.899    She appears to be doing much better and having a positive result to 15 mg of citalopram.  At this time continue same dosing have pharmacy contact us for refills 90 days requested. She could go back to trying some melatonin to see if it helps her go back to sleep easier discussed dosing over-the-counter up to 1 mg or other. Plan our OV virtual visit okay in about 3 months.  Do this for med check earlier if needed.  Glad she is doing better. Counseled.   Expectant management and discussion of plan and treatment with opportunity to ask questions and all were answered. The patient agreed with the plan and demonstrated an understanding of the instructions.   Advised to call back or seek an in-person evaluation if worsening  or having  further concerns .  Return in about 3 months (around 05/09/2019) for medication virtual ok . Marland Kitchen   Shanon Ace, MD

## 2019-04-07 ENCOUNTER — Other Ambulatory Visit: Payer: Self-pay | Admitting: Internal Medicine

## 2019-05-09 ENCOUNTER — Other Ambulatory Visit: Payer: Self-pay

## 2019-05-09 ENCOUNTER — Telehealth (INDEPENDENT_AMBULATORY_CARE_PROVIDER_SITE_OTHER): Payer: Medicare Other | Admitting: Internal Medicine

## 2019-05-09 ENCOUNTER — Encounter: Payer: Self-pay | Admitting: Internal Medicine

## 2019-05-09 DIAGNOSIS — Z79899 Other long term (current) drug therapy: Secondary | ICD-10-CM

## 2019-05-09 DIAGNOSIS — F419 Anxiety disorder, unspecified: Secondary | ICD-10-CM

## 2019-05-09 MED ORDER — CITALOPRAM HYDROBROMIDE 10 MG PO TABS: 10.0000 mg | ORAL_TABLET | Freq: Two times a day (BID) | ORAL | 2 refills | Status: DC

## 2019-05-09 NOTE — Progress Notes (Signed)
Virtual Visit via Video Note  I connected with@ on 05/09/19 at 10:00 AM EST by a video enabled telemedicine application and verified that I am speaking with the correct person using two identifiers. Location patient: home Location provider:work  office Persons participating in the virtual visit: patient, provider  [place] national recommendations  regarding COVID 19 pandemic   video visit is advised over in office visit for this patient.  Patient aware  of the limitations of evaluation and management by telemedicine and  availability of in person appointments. and agreed to proceed.   HPI: Bridget Reyes 74 y.o. video visit   for fu    Anxiety and meds  Had tele visit in October  And was much improved on  Citalopram 15 mg  Per day    Says doing fine with his regimen but consider inc dose if needed  ( pill splitting)      Had appt for  covid vaccine this week   Asks about autoimmune and contraindications  ROS: See pertinent positives and negatives per HPI.  Past Medical History:  Diagnosis Date  . Allergy   . Anxiety    not treated   . Arthritis    knees, neck - no meds  . Cataract    bilateral - MD just watchning  . Colon polyps   . Crohn's   . Eczema   . Esophageal reflux   . Gastritis   . Gout   . Hiatal hernia   . Hyperlipemia   . Ileitis   . Migraines    tx with naproxen - last one 4 yrs ago  . Rosacea   . Skin cancer of face    scca removed     Past Surgical History:  Procedure Laterality Date  . BREAST BIOPSY Left 1980  . COLONOSCOPY  08/2015   Pyrtle -polyp  . DILATION AND CURETTAGE OF UTERUS    . POLYPECTOMY    . TONSILLECTOMY    . TOTAL VAGINAL HYSTERECTOMY  1994  . UPPER GASTROINTESTINAL ENDOSCOPY    . UPPER GI ENDOSCOPY     hiatal hernia  . WISDOM TOOTH EXTRACTION      Family History  Problem Relation Age of Onset  . Parkinsonism Mother   . Lung cancer Mother   . Heart disease Father        died chf in 16 s  . Hypertension Father   .  Congestive Heart Failure Father   . Breast cancer Cousin   . Colon polyps Sister   . Colon cancer Neg Hx   . Esophageal cancer Neg Hx   . Rectal cancer Neg Hx   . Stomach cancer Neg Hx         Current Outpatient Medications:  .  acetaminophen (TYLENOL) 500 MG tablet, Take 500 mg by mouth., Disp: , Rfl:  .  acetic acid-hydrocortisone (VOSOL-HC) OTIC solution, Pt uses as needed, Disp: , Rfl:  .  Azelaic Acid (FINACEA) 15 % cream, After skin is thoroughly washed and patted dry, gently but thoroughly massage a thin film of azelaic acid cream into the affected area twice daily, in the morning and evening., Disp: 50 g, Rfl: 11 .  citalopram (CELEXA) 10 MG tablet, Take 1 tablet (10 mg total) by mouth 2 (two) times daily. Or as directed, Disp: 180 tablet, Rfl: 2 .  cyclobenzaprine (FLEXERIL) 5 MG tablet, Take 1 tablet (5 mg total) by mouth 2 (two) times daily as needed for muscle spasms., Disp: 20  tablet, Rfl: 0 .  Estradiol 10 MCG TABS vaginal tablet, INSERT ONE TABLET IN THE VAGINA AS DIRECTED (Patient not taking: Reported on 09/26/2018), Disp: 24 tablet, Rfl: 0 .  fluticasone (FLONASE) 50 MCG/ACT nasal spray, Place 2 sprays into the nose daily., Disp: 16 g, Rfl: 11 .  ipratropium (ATROVENT) 0.06 % nasal spray, instill 2 sprays into each nostril twice a day to three times a day, Disp: 15 mL, Rfl: prn .  mesalamine (PENTASA) 500 MG CR capsule, TAKE 3 CAPSULES BY MOUTH TWICE DAILY, Disp: 540 capsule, Rfl: 3 .  metoCLOPramide (REGLAN) 10 MG tablet, Take 1 tablet (10 mg total) by mouth as directed., Disp: 2 tablet, Rfl: 0 .  Misc. Devices (VAGINAL SUPPOSITORY APPLICATOR) MISC, by Does not apply route. Vitamin E As needed, Disp: , Rfl:  .  MULTIPLE VITAMIN PO, Take 1 tablet by mouth daily. , Disp: , Rfl:  .  NON FORMULARY, as needed. CBD Cream- applies prn to muscles, joints, Disp: , Rfl:  .  NONFORMULARY OR COMPOUNDED ITEM, 200u/ml.  One pv three times weekly.k  #36/4RF, Disp: 36 each, Rfl: 4 .   Omega-3 1000 MG CAPS, Take by mouth daily., Disp: , Rfl:  .  omeprazole (PRILOSEC) 40 MG capsule, Take 1 capsule (40 mg total) by mouth daily., Disp: 90 capsule, Rfl: 3 .  Probiotic Product (CULTURELLE PROBIOTICS PO), Take by mouth daily., Disp: , Rfl:  .  simvastatin (ZOCOR) 40 MG tablet, TAKE 1 TABLET(40 MG) BY MOUTH AT BEDTIME, Disp: 90 tablet, Rfl: 3 .  valACYclovir (VALTREX) 1000 MG tablet, Take 1 tablet (1,000 mg total) by mouth daily as needed., Disp: 30 tablet, Rfl: 11  EXAM: BP Readings from Last 3 Encounters:  12/19/18 124/62  10/10/18 (!) 141/69  06/08/18 132/70    VITALS per patient if applicable:  GENERAL: alert, oriented, appears well and in no acute distress HEENT: atraumatic, conjunttiva clear, no obvious abnormalities on inspection of external nose and ears NECK: normal movements of the head and neck LUNGS: on inspection no signs of respiratory distress, breathing rate appears normal, no obvious gross SOB, gasping or wheezing  CV: no obvious cyanosis PSYCH/NEURO: pleasant and cooperative, no obvious depression or anxiety, speech and thought processing grossly intact   ASSESSMENT AND PLAN:  Discussed the following assessment and plan:    ICD-10-CM   1. Anxiety  F41.9   2. Medication management  Z79.899    doing much better maintaining    Ok to try 20 mg 10 bid depending on se of convenience of  Pill splitting  Asks for 90 days   Continue med  Plan med check at her next cpx  Planned September  Counseled.  About covid vaccine and CI  She should proceed   Expectant management and discussion of plan and treatment with opportunity to ask questions and all were answered. The patient agreed with the plan and demonstrated an understanding of the instructions.  Total time on date  of service including record review ordering and plan of care:  Counseling and med  Adjustment  22 mintues    Advised to call back or seek an in-person evaluation if worsening  or having   further concerns . Return for cpx and labs  med check in september or as needed .   Shanon Ace, MD

## 2019-05-11 ENCOUNTER — Ambulatory Visit: Payer: Medicare Other | Attending: Internal Medicine

## 2019-05-11 DIAGNOSIS — Z23 Encounter for immunization: Secondary | ICD-10-CM

## 2019-05-17 ENCOUNTER — Other Ambulatory Visit: Payer: Self-pay | Admitting: Internal Medicine

## 2019-06-01 ENCOUNTER — Ambulatory Visit: Payer: Medicare Other | Attending: Internal Medicine

## 2019-06-01 DIAGNOSIS — Z23 Encounter for immunization: Secondary | ICD-10-CM | POA: Insufficient documentation

## 2019-06-01 NOTE — Progress Notes (Signed)
   Covid-19 Vaccination Clinic  Name:  Bridget Reyes    MRN: [id number] DOB: 1946/03/13  06/01/2019  Bridget Reyes was observed post Covid-19 immunization for 15 minutes without incidence. She was provided with Vaccine Information Sheet and instruction to access the V-Safe system.   Bridget Reyes was instructed to call 911 with any severe reactions post vaccine: Marland Kitchen Difficulty breathing  . Swelling of your face and throat  . A fast heartbeat  . A bad rash all over your body  . Dizziness and weakness    Immunizations Administered    Name Date Dose VIS Date Route   Pfizer COVID-19 Vaccine 06/01/2019 10:01 AM 0.3 mL 03/30/2019 Intramuscular   Manufacturer: [place]   Lot: [id number]   [place]: [id number]

## 2019-07-06 ENCOUNTER — Other Ambulatory Visit: Payer: Self-pay | Admitting: Internal Medicine

## 2019-07-06 DIAGNOSIS — K219 Gastro-esophageal reflux disease without esophagitis: Secondary | ICD-10-CM

## 2019-07-12 ENCOUNTER — Ambulatory Visit: Payer: Medicare Other | Admitting: Internal Medicine

## 2019-07-12 ENCOUNTER — Other Ambulatory Visit: Payer: Self-pay

## 2019-07-12 ENCOUNTER — Encounter: Payer: Self-pay | Admitting: Internal Medicine

## 2019-07-12 VITALS — BP 146/62 | HR 67 | Temp 98.5°F | Ht 65.5 in | Wt 144.0 lb

## 2019-07-12 DIAGNOSIS — K5000 Crohn's disease of small intestine without complications: Secondary | ICD-10-CM | POA: Diagnosis not present

## 2019-07-12 DIAGNOSIS — Z86010 Personal history of colonic polyps: Secondary | ICD-10-CM

## 2019-07-12 DIAGNOSIS — K219 Gastro-esophageal reflux disease without esophagitis: Secondary | ICD-10-CM | POA: Diagnosis not present

## 2019-07-12 MED ORDER — OMEPRAZOLE 40 MG PO CPDR: DELAYED_RELEASE_CAPSULE | ORAL | 3 refills | Status: DC

## 2019-07-12 MED ORDER — MESALAMINE ER 500 MG PO CPCR: ORAL_CAPSULE | ORAL | 3 refills | Status: DC

## 2019-07-12 NOTE — Progress Notes (Signed)
   Subjective:    Patient ID: Bridget Reyes, female    DOB: [birth year], 74 y.o.   MRN: [id number]  HPI Bridget Reyes is a 74 year old female with PMH of ileal Crohn's disease, GERD with hiatal hernia with predominant LPR symptoms, history of migraines and hyperlipidemia who is here for follow-up.  She was last seen in the office in February 2020 and for surveillance colonoscopy in June 2020.  She is doing well.  She is taking Pentasa 1.5 g twice daily.  This seems to be controlling her Crohn's symptoms as she is not having any abdominal pain.  Regular bowel habits.  No blood in stool or melena.  Omeprazole works well for her reflux and LPR symptoms at 40 mg a day.  No dysphagia or odynophagia.  She completed her second COVID-19 vaccination and did not have any trouble.  She did have questions about vaccination and her immune system.  Her colonoscopy in June 2020 was normal in the colon but she did have few localized erosions in the terminal ileum.  This was felt consistent with very mild Crohn's.  There was diverticulosis in the sigmoid colon.  No polyps.  Review of Systems As per HPI, otherwise negative  Current Medications, Allergies, Past Medical History, Past Surgical History, Family History and Social History were reviewed in [employer] record.     Objective:   Physical Exam BP (!) 146/62   Pulse 67   Temp 98.5 F (36.9 C)   Ht 5' 5.5" (1.664 m)   Wt 144 lb (65.3 kg)   LMP 04/19/1992   BMI 23.60 kg/m   Gen: awake, alert, NAD HEENT: anicteric CV: RRR, no mrg Pulm: CTA b/l Abd: soft, NT/ND, +BS throughout Ext: no c/c/e Neuro: nonfocal      Assessment & Plan:  74 year old female with PMH of ileal Crohn's disease, GERD with hiatal hernia with predominant LPR symptoms, history of migraines and hyperlipidemia who is here for follow-up.    1.  Crohn's ileitis --erosions at last colonoscopy but no symptoms.  We discussed Pentasa and the safety of  this medication.  I am unsure how much this is helping with her Crohn's but certainly can be beneficial in ileal disease.  We discussed given the low risk nature of this medication and how well she is doing we will continue current therapy --Continue Pentasa 1.5 g twice daily  2.  GERD with LPR --controlled on PPI.  We discussed the risk, benefits and alternatives to chronic PPI therapy and she wishes to continue.  I think this is reasonable --Continue omeprazole 40 mg daily; prior attempts to discontinue this medication resulted in flare of symptoms  3.  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

## 2019-07-12 NOTE — Patient Instructions (Signed)
We have sent the following medications to your pharmacy for you to pick up at your convenience: Pentasa  Omeprazole  If you are age 75 or older, your body mass index should be between 23-30. Your Body mass index is 23.6 kg/m. If this is out of the aforementioned range listed, please consider follow up with your Primary Care Provider.  If you are age 61 or younger, your body mass index should be between 19-25. Your Body mass index is 23.6 kg/m. If this is out of the aformentioned range listed, please consider follow up with your Primary Care Provider.

## 2019-07-13 ENCOUNTER — Ambulatory Visit (INDEPENDENT_AMBULATORY_CARE_PROVIDER_SITE_OTHER): Payer: Medicare Other | Admitting: Obstetrics & Gynecology

## 2019-07-13 ENCOUNTER — Encounter: Payer: Self-pay | Admitting: Obstetrics & Gynecology

## 2019-07-13 ENCOUNTER — Other Ambulatory Visit: Payer: Self-pay

## 2019-07-13 ENCOUNTER — Other Ambulatory Visit (HOSPITAL_COMMUNITY)
Admission: RE | Admit: 2019-07-13 | Discharge: 2019-07-13 | Disposition: A | Discharge status: Home / Self Care | Payer: Medicare Other | Source: Ambulatory Visit | Attending: Obstetrics & Gynecology | Admitting: Obstetrics & Gynecology

## 2019-07-13 VITALS — BP 122/72 | HR 70 | Temp 97.7°F | Resp 16 | Ht 65.25 in | Wt 143.0 lb

## 2019-07-13 DIAGNOSIS — Z124 Encounter for screening for malignant neoplasm of cervix: Secondary | ICD-10-CM

## 2019-07-13 DIAGNOSIS — E2839 Other primary ovarian failure: Secondary | ICD-10-CM

## 2019-07-13 DIAGNOSIS — Z01419 Encounter for gynecological examination (general) (routine) without abnormal findings: Secondary | ICD-10-CM | POA: Diagnosis not present

## 2019-07-13 MED ORDER — NONFORMULARY OR COMPOUNDED ITEM: 4 refills | Status: DC

## 2019-07-13 NOTE — Progress Notes (Signed)
74 y.o. G2P2 Married White or Caucasian female here for annual exam.  Doing well.  Denies vaginal bleeding.  Has completed the Covid vaccination series.  Vit E suppositories have worked well for her.    Saw Dr. Hilarie Fredrickson yesterday.  Sees him due to GERD and Crohn's disease.  Patient's last menstrual period was 04/19/1992.          Sexually active: Yes.    The current method of family planning is status post hysterectomy.    Exercising: Yes.    walking, weights Smoker:  no  Health Maintenance: Pap:  12-28-16 neg History of abnormal Pap:  no MMG:  11-23-2018 category c density birads 1:neg Colonoscopy:  10/10/2018 BMD:   2016 normal TDaP:  2015 Pneumonia vaccine(s):  2015 Shingrix:   done Hep C testing: 2017 neg Screening Labs: 12/2018   reports that she has never smoked. She has never used smokeless tobacco. She reports current alcohol use of about 1.0 - 2.0 standard drinks of alcohol per week. She reports that she does not use drugs.  Past Medical History:  Diagnosis Date  . Allergy   . Anxiety    not treated   . Arthritis    knees, neck - no meds  . Cataract    bilateral - MD just watchning  . Colon polyps   . Crohn's   . Eczema   . Esophageal reflux   . Gastritis   . Gout   . Hiatal hernia   . Hyperlipemia   . Ileitis   . Migraines    tx with naproxen - last one 4 yrs ago  . Rosacea   . Skin cancer of face    scca removed     Past Surgical History:  Procedure Laterality Date  . BREAST BIOPSY Left 1980  . COLONOSCOPY  08/2015   Pyrtle -polyp  . DILATION AND CURETTAGE OF UTERUS    . POLYPECTOMY    . TONSILLECTOMY    . TOTAL VAGINAL HYSTERECTOMY  1994  . UPPER GASTROINTESTINAL ENDOSCOPY    . UPPER GI ENDOSCOPY     hiatal hernia  . WISDOM TOOTH EXTRACTION      Current Outpatient Medications  Medication Sig Dispense Refill  . acetaminophen (TYLENOL) 500 MG tablet Take 500 mg by mouth.    Marland Kitchen acetic acid-hydrocortisone (VOSOL-HC) OTIC solution Pt uses as needed     . citalopram (CELEXA) 10 MG tablet TAKE 1 AND 1/2 TABLETS(15 MG) BY MOUTH DAILY. MAY INCREASE TO 20 MG PER DAY AFTER 1 WEEK AS DIRECTED (Patient taking differently: 20 mg. ) 60 tablet 1  . cyclobenzaprine (FLEXERIL) 5 MG tablet Take 1 tablet (5 mg total) by mouth 2 (two) times daily as needed for muscle spasms. 20 tablet 0  . fluticasone (FLONASE) 50 MCG/ACT nasal spray Place 2 sprays into the nose daily. 16 g 11  . ipratropium (ATROVENT) 0.06 % nasal spray instill 2 sprays into each nostril twice a day to three times a day 15 mL prn  . mesalamine (PENTASA) 500 MG CR capsule TAKE 3 CAPSULES BY MOUTH TWICE DAILY 540 capsule 3  . Misc. Devices (VAGINAL SUPPOSITORY APPLICATOR) MISC by Does not apply route. Vitamin E As needed    . MULTIPLE VITAMIN PO Take 1 tablet by mouth daily.     . NON FORMULARY as needed. CBD Cream- applies prn to muscles, joints    . omeprazole (PRILOSEC) 40 MG capsule TAKE 1 CAPSULE(40 MG) BY MOUTH DAILY 90 capsule 3  .  simvastatin (ZOCOR) 40 MG tablet TAKE 1 TABLET(40 MG) BY MOUTH AT BEDTIME 90 tablet 3  . valACYclovir (VALTREX) 1000 MG tablet Take 1 tablet (1,000 mg total) by mouth daily as needed. 30 tablet 11  . Azelaic Acid (FINACEA) 15 % cream After skin is thoroughly washed and patted dry, gently but thoroughly massage a thin film of azelaic acid cream into the affected area twice daily, in the morning and evening. (Patient not taking: Reported on 07/13/2019) 50 g 11   No current facility-administered medications for this visit.    Family History  Problem Relation Age of Onset  . Parkinsonism Mother   . Lung cancer Mother   . Heart disease Father        died chf in 68 s  . Hypertension Father   . Congestive Heart Failure Father   . Breast cancer Cousin   . Colon polyps Sister   . Colon cancer Neg Hx   . Esophageal cancer Neg Hx   . Rectal cancer Neg Hx   . Stomach cancer Neg Hx     Review of Systems  Constitutional: Negative.   HENT: Negative.   Eyes:  Negative.   Respiratory: Negative.   Cardiovascular: Negative.   Gastrointestinal: Negative.   Endocrine: Negative.   Genitourinary: Negative.   Musculoskeletal: Negative.   Skin: Negative.   Allergic/Immunologic: Negative.   Neurological: Negative.   Psychiatric/Behavioral: Negative.     Exam:   BP 122/72   Pulse 70   Temp 97.7 F (36.5 C) (Skin)   Resp 16   Ht 5' 5.25" (1.657 m)   Wt 143 lb (64.9 kg)   LMP 04/19/1992   BMI 23.61 kg/m     Height: 5' 5.25" (165.7 cm)  Ht Readings from Last 3 Encounters:  07/13/19 5' 5.25" (1.657 m)  07/12/19 5' 5.5" (1.664 m)  12/19/18 5' 6"  (1.676 m)   General appearance: alert, cooperative and appears stated age Head: Normocephalic, without obvious abnormality, atraumatic Neck: no adenopathy, supple, symmetrical, trachea midline and thyroid normal to inspection and palpation Lungs: clear to auscultation bilaterally Breasts: normal appearance, no masses or tenderness Heart: regular rate and rhythm Abdomen: soft, non-tender; bowel sounds normal; no masses,  no organomegaly Extremities: extremities normal, atraumatic, no cyanosis or edema Skin: Skin color, texture, turgor normal. No rashes or lesions Lymph nodes: Cervical, supraclavicular, and axillary nodes normal. No abnormal inguinal nodes palpated Neurologic: Grossly normal   Pelvic: External genitalia:  no lesions              Urethra:  normal appearing urethra with no masses, tenderness or lesions              Bartholins and Skenes: normal                 Vagina: normal appearing vagina with normal color and discharge, no lesions              Cervix: no lesions              Pap taken: Yes.   Bimanual Exam:  Uterus:  normal size, contour, position, consistency, mobility, non-tender              Adnexa: normal adnexa and no mass, fullness, tenderness               Rectovaginal: Confirms               Anus:  normal sphincter tone, no lesions  Chaperone, Terence Lux,  CMA,  was present for exam.  A:  Well Woman with normal exam PMP, no HRT H/O TVH H/o vaginal ulceration with granulation tissue after Estring use--again is normal again H/o crohn's ileitis and GERD, followed by Dr. Hilarie Fredrickson H/o gout Elevated lipids  P:   Mammogram guidelines reviewed.  Doing 3D MMG pap smear obtained today Colonoscopy UTD BMD order placed for pt to do with MMG this year.  Pt aware. RF for Vit E vaginal suppositories 200u/ml, one pv three times weekly.  #36/4RF Return annually or prn

## 2019-07-17 LAB — CYTOLOGY - PAP: Diagnosis: NEGATIVE

## 2019-07-20 ENCOUNTER — Other Ambulatory Visit: Payer: Self-pay | Admitting: Obstetrics & Gynecology

## 2019-07-20 DIAGNOSIS — E2839 Other primary ovarian failure: Secondary | ICD-10-CM

## 2019-07-23 DIAGNOSIS — D2272 Melanocytic nevi of left lower limb, including hip: Secondary | ICD-10-CM | POA: Diagnosis not present

## 2019-07-23 DIAGNOSIS — D2271 Melanocytic nevi of right lower limb, including hip: Secondary | ICD-10-CM | POA: Diagnosis not present

## 2019-07-23 DIAGNOSIS — D2261 Melanocytic nevi of right upper limb, including shoulder: Secondary | ICD-10-CM | POA: Diagnosis not present

## 2019-07-23 DIAGNOSIS — L218 Other seborrheic dermatitis: Secondary | ICD-10-CM | POA: Diagnosis not present

## 2019-07-23 DIAGNOSIS — L590 Erythema ab igne [dermatitis ab igne]: Secondary | ICD-10-CM | POA: Diagnosis not present

## 2019-07-31 DIAGNOSIS — H2513 Age-related nuclear cataract, bilateral: Secondary | ICD-10-CM | POA: Diagnosis not present

## 2019-10-15 ENCOUNTER — Other Ambulatory Visit: Payer: Self-pay | Admitting: Obstetrics & Gynecology

## 2019-10-15 DIAGNOSIS — Z1231 Encounter for screening mammogram for malignant neoplasm of breast: Secondary | ICD-10-CM

## 2019-12-14 ENCOUNTER — Other Ambulatory Visit: Payer: Self-pay | Admitting: Internal Medicine

## 2019-12-20 NOTE — Progress Notes (Signed)
Chief Complaint  Patient presents with  . Annual Exam    Doing well    HPI: Bridget Reyes 74 y.o. comes in today for Preventive Medicare exam/ wellness visit .Since last visit.  HLD taking med no se needs refill GI quiescent  utd on  Colon recurrent cold sores a bit more since masking  Needs refill valtrex Mood Citalopram very helpful   Taking 20 mg per day wishes tpo stau on meds Had renet sciatica  Right back leg getting better   Backed off some activity   Health Maintenance  Topic Date Due  . INFLUENZA VACCINE  11/18/2019  . MAMMOGRAM  11/22/2020  . COLONOSCOPY  10/10/2023  . TETANUS/TDAP  11/14/2023  . DEXA SCAN  Completed  . COVID-19 Vaccine  Completed  . Hepatitis C Screening  Completed  . PNA vac Low Risk Adult  Completed   Health Maintenance Review LIFESTYLE:  Exercise:   Tobacco/ETS: no Alcohol:  ocass not daily. Sugar beverages:    Sleep:   Better   Fit bit  6.5- 7  Drug use: no HH:  2  No pets  supp  cbd   For  Sleep       Hearing:  R  hearing  Aid had fungal inf in r eac  Bothers her some  Vision:  No limitations at present . Last eye check UTD  Safety:  Has smoke detector and wears seat belts.No excess sun exposure. Sees dentist regularly.  Memory: Felt to be good  , no concern from her or her family.  Depression: No anhedonia unusual crying or depressive symptoms  Med helps   Nutrition: Eats well balanced diet; adequate calcium and vitamin D. No swallowing chewing problems.  Injury: no major injuries in the last six months.  Other healthcare providers:  Reviewed todayPreventive parameters: up-to-date  Reviewed   ADLS:   There are no problems or need for assistance  driving, feeding, obtaining food, dressing, toileting and bathing, managing money using phone. She is independent.    ROS:  GEN/ HEENT: No fever, significant weight changes sweats headaches vision problems hearing changes, CV/ PULM; No chest pain shortness of breath  cough, syncope,edema  change in exercise tolerance. GI /GU: No adominal pain, vomiting, change in bowel habits. No blood in the stool. No significant GU symptoms. SKIN/HEME: ,no acute skin rashes suspicious lesions or bleeding. No lymphadenopathy, nodules, masses.  NEURO/ PSYCH:  No weakness  See hpi balls fo feet feel numb numbness. No depression anxiety. Controlled bu meds  IMM/ Allergy: No unusual infections.  Allergy .   Takes atrovent needs refill  REST of 12 system review negative except as per HPI   Past Medical History:  Diagnosis Date  . Allergy   . Anxiety    not treated   . Arthritis    knees, neck - no meds  . Cataract    bilateral - MD just watchning  . Colon polyps   . Crohn's   . Eczema   . Esophageal reflux   . Gastritis   . Gout   . Hiatal hernia   . Hyperlipemia   . Ileitis   . Migraines    tx with naproxen - last one 4 yrs ago  . Rosacea   . Skin cancer of face    scca removed     Family History  Problem Relation Age of Onset  . Parkinsonism Mother   . Lung cancer Mother   . Heart disease  Father        died chf in 55 s  . Hypertension Father   . Congestive Heart Failure Father   . Breast cancer Cousin   . Colon polyps Sister   . Colon cancer Neg Hx   . Esophageal cancer Neg Hx   . Rectal cancer Neg Hx   . Stomach cancer Neg Hx     Social History   Socioeconomic History  . Marital status: Married    Spouse name: Not on file  . Number of children: 2  . Years of education: Not on file  . Highest education level: Not on file  Occupational History  . Occupation: retired     Comment: Games developer: UNEMPLOYED  Tobacco Use  . Smoking status: Never Smoker  . Smokeless tobacco: Never Used  Vaping Use  . Vaping Use: Never used  Substance and Sexual Activity  . Alcohol use: Yes    Alcohol/week: 1.0 - 2.0 standard drink    Types: 1 - 2 Glasses of wine per week  . Drug use: No  . Sexual activity: Yes    Partners: Male     Birth control/protection: Surgical    Comment: hysterectomy  Other Topics Concern  . Not on file  Social History Narrative   hh  Of 2       Dog    Married    G2P2   Social Determinants of Health   Financial Resource Strain:   . Difficulty of Paying Living Expenses: Not on file  Food Insecurity:   . Worried About Charity fundraiser in the Last Year: Not on file  . Ran Out of Food in the Last Year: Not on file  Transportation Needs:   . Lack of Transportation (Medical): Not on file  . Lack of Transportation (Non-Medical): Not on file  Physical Activity:   . Days of Exercise per Week: Not on file  . Minutes of Exercise per Session: Not on file  Stress:   . Feeling of Stress : Not on file  Social Connections:   . Frequency of Communication with Friends and Family: Not on file  . Frequency of Social Gatherings with Friends and Family: Not on file  . Attends Religious Services: Not on file  . Active Member of Clubs or Organizations: Not on file  . Attends Archivist Meetings: Not on file  . Marital Status: Not on file    Outpatient Encounter Medications as of 12/21/2019  Medication Sig  . acetaminophen (TYLENOL) 500 MG tablet Take 500 mg by mouth.  . citalopram (CELEXA) 20 MG tablet Take 1 tablet (20 mg total) by mouth daily.  . cyclobenzaprine (FLEXERIL) 5 MG tablet Take 1 tablet (5 mg total) by mouth 2 (two) times daily as needed for muscle spasms.  . fluticasone (FLONASE) 50 MCG/ACT nasal spray Place 2 sprays into the nose daily.  Marland Kitchen ipratropium (ATROVENT) 0.06 % nasal spray instill 2 sprays into each nostril twice a day to three times a day  . mesalamine (PENTASA) 500 MG CR capsule TAKE 3 CAPSULES BY MOUTH TWICE DAILY  . Misc. Devices (VAGINAL SUPPOSITORY APPLICATOR) MISC by Does not apply route. Vitamin E As needed  . MULTIPLE VITAMIN PO Take 1 tablet by mouth daily.   . NON FORMULARY as needed. CBD Cream- applies prn to muscles, joints  . NONFORMULARY OR  COMPOUNDED ITEM 200u/ml.  One pv three times weekly.k  #36/4RF  . omeprazole (PRILOSEC) 40  MG capsule TAKE 1 CAPSULE(40 MG) BY MOUTH DAILY  . simvastatin (ZOCOR) 40 MG tablet Take 1 tablet (40 mg total) by mouth daily.  . valACYclovir (VALTREX) 1000 MG tablet Take 2 tablets (2,000 mg total) by mouth 2 (two) times daily as needed.  . [DISCONTINUED] citalopram (CELEXA) 10 MG tablet TAKE 1 AND 1/2 TABLETS(15 MG) BY MOUTH DAILY. MAY INCREASE TO 20 MG PER DAY AFTER 1 WEEK AS DIRECTED (Patient taking differently: 20 mg. )  . [DISCONTINUED] ipratropium (ATROVENT) 0.06 % nasal spray instill 2 sprays into each nostril twice a day to three times a day  . [DISCONTINUED] simvastatin (ZOCOR) 40 MG tablet TAKE 1 TABLET(40 MG) BY MOUTH AT BEDTIME  . [DISCONTINUED] valACYclovir (VALTREX) 1000 MG tablet Take 1 tablet (1,000 mg total) by mouth daily as needed.  Marland Kitchen acetic acid-hydrocortisone (VOSOL-HC) OTIC solution Pt uses as needed (Patient not taking: Reported on 12/21/2019)  . Azelaic Acid (FINACEA) 15 % cream After skin is thoroughly washed and patted dry, gently but thoroughly massage a thin film of azelaic acid cream into the affected area twice daily, in the morning and evening. (Patient not taking: Reported on 07/13/2019)   No facility-administered encounter medications on file as of 12/21/2019.    EXAM:  BP (!) 144/78   Pulse 64   Temp 97.7 F (36.5 C) (Oral)   Ht 5' 5.25" (1.657 m)   Wt 149 lb 3.2 oz (67.7 kg)   LMP 04/19/1992   SpO2 99%   BMI 24.64 kg/m   Body mass index is 24.64 kg/m.  Physical Exam: Vital signs reviewed ZOX:WRUE is a well-developed well-nourished alert cooperative   who appears stated age in no acute distress.  HEENT: normocephalic atraumatic , Eyes: PERRL EOM's full, conjunctiva clear, Nares: paten,t no deformity discharge or tenderness., Ears: no deformity EAC's clear TMs with normal landmarks. Mouth: masked  NECK: supple without masses, thyromegaly or bruits. CHEST/PULM:   Clear to auscultation and percussion breath sounds equal no wheeze , rales or rhonchi. No chest wall deformities or tenderness. CV: PMI is nondisplaced, S1 S2 no gallops, murmurs, rubs. Peripheral pulses are full without delay.No JVD .  ABDOMEN: Bowel sounds normal nontender  No guard or rebound, no hepato splenomegal no CVA tenderness.   Extremtities:  No clubbing cyanosis or edema, no acute joint swelling or redness no focal atrophy NEURO:  Oriented x3, cranial nerves 3-12 appear to be intact, no obvious focal weakness,gait within normal limits no abnormal reflexes or asymmetrical SKIN: No acute rashes normal turgor, color, no bruising or petechiae. PSYCH: Oriented, good eye contact, no obvious depression anxiety, cognition and judgment appear normal. LN: no cervical axillary inguinal adenopathy No noted deficits in memory, attention, and speech.   Lab Results  Component Value Date   WBC 5.9 12/19/2018   HGB 14.1 12/19/2018   HCT 41.2 12/19/2018   PLT 219.0 12/19/2018   GLUCOSE 90 12/19/2018   CHOL 155 12/19/2018   TRIG 147.0 12/19/2018   HDL 43.30 12/19/2018   LDLCALC 82 12/19/2018   ALT 25 12/19/2018   AST 21 12/19/2018   NA 139 12/19/2018   K 4.3 12/19/2018   CL 101 12/19/2018   CREATININE 0.79 12/19/2018   BUN 17 12/19/2018   CO2 30 12/19/2018   TSH 4.47 12/19/2018   HGBA1C 5.6 12/19/2018   Record review  ASSESSMENT AND PLAN:  Discussed the following assessment and plan:  Visit for preventive health examination  Medication management - Plan: CBC with Differential/Platelet, Hepatic function  panel, Lipid panel, TSH, BASIC METABOLIC PANEL WITH GFR, Vitamin B12, Hemoglobin A1c  Hyperlipidemia, unspecified hyperlipidemia type - Plan: CBC with Differential/Platelet, Hepatic function panel, Lipid panel, TSH, BASIC METABOLIC PANEL WITH GFR, Vitamin B12, Hemoglobin A1c  Fasting hyperglycemia - Plan: CBC with Differential/Platelet, Hepatic function panel, Lipid panel, TSH,  BASIC METABOLIC PANEL WITH GFR, Vitamin B12, Hemoglobin A1c  Crohn's disease of both small and large intestine without complication (HCC) - Plan: CBC with Differential/Platelet, Hepatic function panel, Lipid panel, TSH, BASIC METABOLIC PANEL WITH GFR, Vitamin B12, Hemoglobin A1c  Tingling of both feet - poss mechanical  r/o low b12 because of risk with RE - Plan: CBC with Differential/Platelet, Hepatic function panel, Lipid panel, TSH, BASIC METABOLIC PANEL WITH GFR, Vitamin B12, Hemoglobin A1c  Recurrent cold sores  Counseled about COVID-19 virus infection Lab monitoring   b12   Plan follow up depending   discc   Flu vaccine  And covid "booster" when info available  Risk benefit  Patient Care Team: Dazja Houchin, Standley Brooking, MD as PCP - General Pedro Earls, MD (Family Medicine) Martinique, Amy, MD (Dermatology) Megan Salon, MD (Gynecology) Orie Rout, MD as Referring Physician (Specialist) Marygrace Drought, MD (Ophthalmology) Jerrell Belfast, MD as Consulting Physician (Otolaryngology) Marica Otter, OD (Optometry) Pyrtle, Lajuan Lines, MD as Consulting Physician (Gastroenterology)  Patient Instructions  Glad you are doing well.   Lab today  Refill medications.   Yearly flu vaccine   Health Maintenance, Female Adopting a healthy lifestyle and getting preventive care are important in promoting health and wellness. Ask your health care provider about:  The right schedule for you to have regular tests and exams.  Things you can do on your own to prevent diseases and keep yourself healthy. What should I know about diet, weight, and exercise? Eat a healthy diet   Eat a diet that includes plenty of vegetables, fruits, low-fat dairy products, and lean protein.  Do not eat a lot of foods that are high in solid fats, added sugars, or sodium. Maintain a healthy weight Body mass index (BMI) is used to identify weight problems. It estimates body fat based on height and weight. Your health  care provider can help determine your BMI and help you achieve or maintain a healthy weight. Get regular exercise Get regular exercise. This is one of the most important things you can do for your health. Most adults should:  Exercise for at least 150 minutes each week. The exercise should increase your heart rate and make you sweat (moderate-intensity exercise).  Do strengthening exercises at least twice a week. This is in addition to the moderate-intensity exercise.  Spend less time sitting. Even light physical activity can be beneficial. Watch cholesterol and blood lipids Have your blood tested for lipids and cholesterol at 75 years of age, then have this test every 5 years. Have your cholesterol levels checked more often if:  Your lipid or cholesterol levels are high.  You are older than 75 years of age.  You are at high risk for heart disease. What should I know about cancer screening? Depending on your health history and family history, you may need to have cancer screening at various ages. This may include screening for:  Breast cancer.  Cervical cancer.  Colorectal cancer.  Skin cancer.  Lung cancer. What should I know about heart disease, diabetes, and high blood pressure? Blood pressure and heart disease  High blood pressure causes heart disease and increases the risk of stroke. This is  more likely to develop in people who have high blood pressure readings, are of African descent, or are overweight.  Have your blood pressure checked: ? Every 3-5 years if you are 79-68 years of age. ? Every year if you are 21 years old or older. Diabetes Have regular diabetes screenings. This checks your fasting blood sugar level. Have the screening done:  Once every three years after age 90 if you are at a normal weight and have a low risk for diabetes.  More often and at a younger age if you are overweight or have a high risk for diabetes. What should I know about preventing  infection? Hepatitis B If you have a higher risk for hepatitis B, you should be screened for this virus. Talk with your health care provider to find out if you are at risk for hepatitis B infection. Hepatitis C Testing is recommended for:  Everyone born from [birth year] through 1966.  Anyone with known risk factors for hepatitis C. Sexually transmitted infections (STIs)  Get screened for STIs, including gonorrhea and chlamydia, if: ? You are sexually active and are younger than 75 years of age. ? You are older than 75 years of age and your health care provider tells you that you are at risk for this type of infection. ? Your sexual activity has changed since you were last screened, and you are at increased risk for chlamydia or gonorrhea. Ask your health care provider if you are at risk.  Ask your health care provider about whether you are at high risk for HIV. Your health care provider may recommend a prescription medicine to help prevent HIV infection. If you choose to take medicine to prevent HIV, you should first get tested for HIV. You should then be tested every 3 months for as long as you are taking the medicine. Pregnancy  If you are about to stop having your period (premenopausal) and you may become pregnant, seek counseling before you get pregnant.  Take 400 to 800 micrograms (mcg) of folic acid every day if you become pregnant.  Ask for birth control (contraception) if you want to prevent pregnancy. Osteoporosis and menopause Osteoporosis is a disease in which the bones lose minerals and strength with aging. This can result in bone fractures. If you are 58 years old or older, or if you are at risk for osteoporosis and fractures, ask your health care provider if you should:  Be screened for bone loss.  Take a calcium or vitamin D supplement to lower your risk of fractures.  Be given hormone replacement therapy (HRT) to treat symptoms of menopause. Follow these instructions at  home: Lifestyle  Do not use any products that contain nicotine or tobacco, such as cigarettes, e-cigarettes, and chewing tobacco. If you need help quitting, ask your health care provider.  Do not use street drugs.  Do not share needles.  Ask your health care provider for help if you need support or information about quitting drugs. Alcohol use  Do not drink alcohol if: ? Your health care provider tells you not to drink. ? You are pregnant, may be pregnant, or are planning to become pregnant.  If you drink alcohol: ? Limit how much you use to 0-1 drink a day. ? Limit intake if you are breastfeeding.  Be aware of how much alcohol is in your drink. In the [place] one drink equals one 12 oz bottle of beer (355 mL), one 5 oz glass of wine (148 mL), or one  1 oz glass of hard liquor (44 mL). General instructions  Schedule regular health, dental, and eye exams.  Stay current with your vaccines.  Tell your health care provider if: ? You often feel depressed. ? You have ever been abused or do not feel safe at home. Summary  Adopting a healthy lifestyle and getting preventive care are important in promoting health and wellness.  Follow your health care provider's instructions about healthy diet, exercising, and getting tested or screened for diseases.  Follow your health care provider's instructions on monitoring your cholesterol and blood pressure. This information is not intended to replace advice given to you by your health care provider. Make sure you discuss any questions you have with your health care provider. Document Revised: 03/29/2018 Document Reviewed: 03/29/2018 Elsevier Patient Education  2020 [place] Terrie Grajales M.D.

## 2019-12-21 ENCOUNTER — Other Ambulatory Visit: Payer: Self-pay

## 2019-12-21 ENCOUNTER — Ambulatory Visit (INDEPENDENT_AMBULATORY_CARE_PROVIDER_SITE_OTHER): Payer: Medicare Other | Admitting: Internal Medicine

## 2019-12-21 ENCOUNTER — Encounter: Payer: Self-pay | Admitting: Internal Medicine

## 2019-12-21 VITALS — BP 144/78 | HR 64 | Temp 97.7°F | Ht 65.25 in | Wt 149.2 lb

## 2019-12-21 DIAGNOSIS — Z7189 Other specified counseling: Secondary | ICD-10-CM

## 2019-12-21 DIAGNOSIS — K5080 Crohn's disease of both small and large intestine without complications: Secondary | ICD-10-CM

## 2019-12-21 DIAGNOSIS — R202 Paresthesia of skin: Secondary | ICD-10-CM

## 2019-12-21 DIAGNOSIS — Z79899 Other long term (current) drug therapy: Secondary | ICD-10-CM

## 2019-12-21 DIAGNOSIS — E785 Hyperlipidemia, unspecified: Secondary | ICD-10-CM | POA: Diagnosis not present

## 2019-12-21 DIAGNOSIS — Z0001 Encounter for general adult medical examination with abnormal findings: Secondary | ICD-10-CM

## 2019-12-21 DIAGNOSIS — B001 Herpesviral vesicular dermatitis: Secondary | ICD-10-CM

## 2019-12-21 DIAGNOSIS — R7301 Impaired fasting glucose: Secondary | ICD-10-CM

## 2019-12-21 DIAGNOSIS — Z0000 Encounter for general adult medical examination without abnormal findings: Secondary | ICD-10-CM

## 2019-12-21 MED ORDER — IPRATROPIUM BROMIDE 0.06 % NA SOLN: NASAL | 99 refills | Status: DC

## 2019-12-21 MED ORDER — SIMVASTATIN 40 MG PO TABS
40.0000 mg | ORAL_TABLET | Freq: Every day | ORAL | 3 refills | Status: DC
Start: 2019-12-21 — End: 2020-12-17

## 2019-12-21 MED ORDER — CITALOPRAM HYDROBROMIDE 20 MG PO TABS
20.0000 mg | ORAL_TABLET | Freq: Every day | ORAL | 3 refills | Status: DC
Start: 2019-12-21 — End: 2020-12-17

## 2019-12-21 MED ORDER — VALACYCLOVIR HCL 1 G PO TABS
2000.0000 mg | ORAL_TABLET | Freq: Two times a day (BID) | ORAL | 11 refills | Status: DC | PRN
Start: 2019-12-21 — End: 2020-12-23

## 2019-12-21 NOTE — Patient Instructions (Signed)
Glad you are doing well.   Lab today  Refill medications.   Yearly flu vaccine   Health Maintenance, Female Adopting a healthy lifestyle and getting preventive care are important in promoting health and wellness. Ask your health care provider about:  The right schedule for you to have regular tests and exams.  Things you can do on your own to prevent diseases and keep yourself healthy. What should I know about diet, weight, and exercise? Eat a healthy diet   Eat a diet that includes plenty of vegetables, fruits, low-fat dairy products, and lean protein.  Do not eat a lot of foods that are high in solid fats, added sugars, or sodium. Maintain a healthy weight Body mass index (BMI) is used to identify weight problems. It estimates body fat based on height and weight. Your health care provider can help determine your BMI and help you achieve or maintain a healthy weight. Get regular exercise Get regular exercise. This is one of the most important things you can do for your health. Most adults should:  Exercise for at least 150 minutes each week. The exercise should increase your heart rate and make you sweat (moderate-intensity exercise).  Do strengthening exercises at least twice a week. This is in addition to the moderate-intensity exercise.  Spend less time sitting. Even light physical activity can be beneficial. Watch cholesterol and blood lipids Have your blood tested for lipids and cholesterol at 75 years of age, then have this test every 5 years. Have your cholesterol levels checked more often if:  Your lipid or cholesterol levels are high.  You are older than 75 years of age.  You are at high risk for heart disease. What should I know about cancer screening? Depending on your health history and family history, you may need to have cancer screening at various ages. This may include screening for:  Breast cancer.  Cervical cancer.  Colorectal cancer.  Skin  cancer.  Lung cancer. What should I know about heart disease, diabetes, and high blood pressure? Blood pressure and heart disease  High blood pressure causes heart disease and increases the risk of stroke. This is more likely to develop in people who have high blood pressure readings, are of African descent, or are overweight.  Have your blood pressure checked: ? Every 3-5 years if you are 69-65 years of age. ? Every year if you are 72 years old or older. Diabetes Have regular diabetes screenings. This checks your fasting blood sugar level. Have the screening done:  Once every three years after age 75 if you are at a normal weight and have a low risk for diabetes.  More often and at a younger age if you are overweight or have a high risk for diabetes. What should I know about preventing infection? Hepatitis B If you have a higher risk for hepatitis B, you should be screened for this virus. Talk with your health care provider to find out if you are at risk for hepatitis B infection. Hepatitis C Testing is recommended for:  Everyone born from [birth year] through 1966.  Anyone with known risk factors for hepatitis C. Sexually transmitted infections (STIs)  Get screened for STIs, including gonorrhea and chlamydia, if: ? You are sexually active and are younger than 75 years of age. ? You are older than 75 years of age and your health care provider tells you that you are at risk for this type of infection. ? Your sexual activity has changed since you  were last screened, and you are at increased risk for chlamydia or gonorrhea. Ask your health care provider if you are at risk.  Ask your health care provider about whether you are at high risk for HIV. Your health care provider may recommend a prescription medicine to help prevent HIV infection. If you choose to take medicine to prevent HIV, you should first get tested for HIV. You should then be tested every 3 months for as long as you are taking  the medicine. Pregnancy  If you are about to stop having your period (premenopausal) and you may become pregnant, seek counseling before you get pregnant.  Take 400 to 800 micrograms (mcg) of folic acid every day if you become pregnant.  Ask for birth control (contraception) if you want to prevent pregnancy. Osteoporosis and menopause Osteoporosis is a disease in which the bones lose minerals and strength with aging. This can result in bone fractures. If you are 18 years old or older, or if you are at risk for osteoporosis and fractures, ask your health care provider if you should:  Be screened for bone loss.  Take a calcium or vitamin D supplement to lower your risk of fractures.  Be given hormone replacement therapy (HRT) to treat symptoms of menopause. Follow these instructions at home: Lifestyle  Do not use any products that contain nicotine or tobacco, such as cigarettes, e-cigarettes, and chewing tobacco. If you need help quitting, ask your health care provider.  Do not use street drugs.  Do not share needles.  Ask your health care provider for help if you need support or information about quitting drugs. Alcohol use  Do not drink alcohol if: ? Your health care provider tells you not to drink. ? You are pregnant, may be pregnant, or are planning to become pregnant.  If you drink alcohol: ? Limit how much you use to 0-1 drink a day. ? Limit intake if you are breastfeeding.  Be aware of how much alcohol is in your drink. In the [place] one drink equals one 12 oz bottle of beer (355 mL), one 5 oz glass of wine (148 mL), or one 1 oz glass of hard liquor (44 mL). General instructions  Schedule regular health, dental, and eye exams.  Stay current with your vaccines.  Tell your health care provider if: ? You often feel depressed. ? You have ever been abused or do not feel safe at home. Summary  Adopting a healthy lifestyle and getting preventive care are important in  promoting health and wellness.  Follow your health care provider's instructions about healthy diet, exercising, and getting tested or screened for diseases.  Follow your health care provider's instructions on monitoring your cholesterol and blood pressure. This information is not intended to replace advice given to you by your health care provider. Make sure you discuss any questions you have with your health care provider. Document Revised: 03/29/2018 Document Reviewed: 03/29/2018 Elsevier Patient Education  2020 [employer].

## 2019-12-22 LAB — HEPATIC FUNCTION PANEL
AG Ratio: 1.7 (calc) (ref 1.0–2.5)
ALT: 37 U/L — ABNORMAL HIGH (ref 6–29)
AST: 34 U/L (ref 10–35)
Albumin: 4.6 g/dL (ref 3.6–5.1)
Alkaline phosphatase (APISO): 80 U/L (ref 37–153)
Bilirubin, Direct: 0.1 mg/dL (ref 0.0–0.2)
Globulin: 2.7 g/dL (calc) (ref 1.9–3.7)
Indirect Bilirubin: 0.3 mg/dL (calc) (ref 0.2–1.2)
Total Bilirubin: 0.4 mg/dL (ref 0.2–1.2)
Total Protein: 7.3 g/dL (ref 6.1–8.1)

## 2019-12-22 LAB — CBC WITH DIFFERENTIAL/PLATELET
Absolute Monocytes: 441 cells/uL (ref 200–950)
Basophils Absolute: 50 cells/uL (ref 0–200)
Basophils Relative: 1.1 %
Eosinophils Absolute: 131 cells/uL (ref 15–500)
Eosinophils Relative: 2.9 %
HCT: 42.7 % (ref 35.0–45.0)
Hemoglobin: 14.2 g/dL (ref 11.7–15.5)
Lymphs Abs: 981 cells/uL (ref 850–3900)
MCH: 31.5 pg (ref 27.0–33.0)
MCHC: 33.3 g/dL (ref 32.0–36.0)
MCV: 94.7 fL (ref 80.0–100.0)
MPV: 10.3 fL (ref 7.5–12.5)
Monocytes Relative: 9.8 %
Neutro Abs: 2898 cells/uL (ref 1500–7800)
Neutrophils Relative %: 64.4 %
Platelets: 241 10*3/uL (ref 140–400)
RBC: 4.51 10*6/uL (ref 3.80–5.10)
RDW: 11.8 % (ref 11.0–15.0)
Total Lymphocyte: 21.8 %
WBC: 4.5 10*3/uL (ref 3.8–10.8)

## 2019-12-22 LAB — VITAMIN B12: Vitamin B-12: 595 pg/mL (ref 200–1100)

## 2019-12-22 LAB — BASIC METABOLIC PANEL WITH GFR
BUN: 19 mg/dL (ref 7–25)
CO2: 29 mmol/L (ref 20–32)
Calcium: 9.5 mg/dL (ref 8.6–10.4)
Chloride: 99 mmol/L (ref 98–110)
Creat: 0.77 mg/dL (ref 0.60–0.93)
GFR, Est African American: 88 mL/min/{1.73_m2} (ref >=60)
GFR, Est Non African American: 76 mL/min/{1.73_m2} (ref >=60)
Glucose, Bld: 93 mg/dL (ref 65–99)
Potassium: 4.5 mmol/L (ref 3.5–5.3)
Sodium: 137 mmol/L (ref 135–146)

## 2019-12-22 LAB — LIPID PANEL
Cholesterol: 165 mg/dL (ref <200)
HDL: 50 mg/dL (ref >=50)
LDL Cholesterol (Calc): 92 mg/dL (calc)
Non-HDL Cholesterol (Calc): 115 mg/dL (calc) (ref <130)
Total CHOL/HDL Ratio: 3.3 (calc) (ref <5.0)
Triglycerides: 134 mg/dL (ref <150)

## 2019-12-22 LAB — HEMOGLOBIN A1C
Hgb A1c MFr Bld: 5.3 % of total Hgb (ref <5.7)
Mean Plasma Glucose: 105 (calc)
eAG (mmol/L): 5.8 (calc)

## 2019-12-22 LAB — TSH: TSH: 3.84 mIU/L (ref 0.40–4.50)

## 2019-12-22 NOTE — Progress Notes (Signed)
Labs are normal except a minor elevation of one liver test. May not be clinically significant  But arrange LFT in 2-3 months ( no need to fast  )

## 2019-12-25 ENCOUNTER — Other Ambulatory Visit: Payer: Self-pay

## 2019-12-25 DIAGNOSIS — R7401 Elevation of levels of liver transaminase levels: Secondary | ICD-10-CM

## 2020-01-02 ENCOUNTER — Ambulatory Visit
Admission: RE | Admit: 2020-01-02 | Discharge: 2020-01-02 | Disposition: A | Discharge status: Home / Self Care | Payer: Medicare Other | Source: Ambulatory Visit | Attending: Obstetrics & Gynecology | Admitting: Obstetrics & Gynecology

## 2020-01-02 ENCOUNTER — Other Ambulatory Visit: Payer: Self-pay

## 2020-01-02 DIAGNOSIS — Z780 Asymptomatic menopausal state: Secondary | ICD-10-CM | POA: Diagnosis not present

## 2020-01-02 DIAGNOSIS — E2839 Other primary ovarian failure: Secondary | ICD-10-CM

## 2020-01-02 DIAGNOSIS — Z1231 Encounter for screening mammogram for malignant neoplasm of breast: Secondary | ICD-10-CM

## 2020-01-30 ENCOUNTER — Ambulatory Visit
Admission: RE | Admit: 2020-01-30 | Discharge: 2020-01-30 | Disposition: A | Discharge status: Home / Self Care | Payer: Medicare Other | Source: Ambulatory Visit | Attending: Obstetrics & Gynecology | Admitting: Obstetrics & Gynecology

## 2020-01-30 ENCOUNTER — Other Ambulatory Visit: Payer: Self-pay

## 2020-01-30 DIAGNOSIS — Z1231 Encounter for screening mammogram for malignant neoplasm of breast: Secondary | ICD-10-CM | POA: Diagnosis not present

## 2020-02-11 ENCOUNTER — Ambulatory Visit: Payer: Medicare Other

## 2020-03-03 ENCOUNTER — Telehealth: Payer: Self-pay | Admitting: Internal Medicine

## 2020-03-03 NOTE — Telephone Encounter (Signed)
Patient doesn't want to schedule for a AWV.

## 2020-03-03 NOTE — Telephone Encounter (Signed)
Left message with spouse for patient to call back and schedule Medicare Annual Wellness Visit (AWV) either virtually or in office.  Last AWV 11/25/15; please schedule at anytime with [facility] Nurse Health Advisor 2.  This should be a 45 minute visit.

## 2020-03-10 ENCOUNTER — Other Ambulatory Visit: Payer: Self-pay

## 2020-03-10 ENCOUNTER — Other Ambulatory Visit: Payer: Medicare Other

## 2020-03-10 DIAGNOSIS — R7401 Elevation of levels of liver transaminase levels: Secondary | ICD-10-CM | POA: Diagnosis not present

## 2020-03-11 LAB — HEPATIC FUNCTION PANEL
AG Ratio: 1.8 (calc) (ref 1.0–2.5)
ALT: 18 U/L (ref 6–29)
AST: 20 U/L (ref 10–35)
Albumin: 4.6 g/dL (ref 3.6–5.1)
Alkaline phosphatase (APISO): 82 U/L (ref 37–153)
Bilirubin, Direct: 0.1 mg/dL (ref 0.0–0.2)
Globulin: 2.5 g/dL (calc) (ref 1.9–3.7)
Indirect Bilirubin: 0.3 mg/dL (calc) (ref 0.2–1.2)
Total Bilirubin: 0.4 mg/dL (ref 0.2–1.2)
Total Protein: 7.1 g/dL (ref 6.1–8.1)

## 2020-03-11 NOTE — Progress Notes (Signed)
Liver panel now normal

## 2020-06-27 ENCOUNTER — Other Ambulatory Visit: Payer: Self-pay

## 2020-06-27 ENCOUNTER — Ambulatory Visit: Payer: Medicare Other | Admitting: Internal Medicine

## 2020-06-27 ENCOUNTER — Encounter: Payer: Self-pay | Admitting: Internal Medicine

## 2020-06-27 VITALS — BP 134/62 | HR 69 | Ht 65.25 in | Wt 155.0 lb

## 2020-06-27 DIAGNOSIS — Z86010 Personal history of colonic polyps: Secondary | ICD-10-CM | POA: Diagnosis not present

## 2020-06-27 DIAGNOSIS — K5000 Crohn's disease of small intestine without complications: Secondary | ICD-10-CM

## 2020-06-27 DIAGNOSIS — K219 Gastro-esophageal reflux disease without esophagitis: Secondary | ICD-10-CM

## 2020-06-27 MED ORDER — MESALAMINE ER 500 MG PO CPCR: ORAL_CAPSULE | ORAL | 3 refills | Status: DC

## 2020-06-27 MED ORDER — OMEPRAZOLE 40 MG PO CPDR: DELAYED_RELEASE_CAPSULE | ORAL | 3 refills | Status: DC

## 2020-06-27 NOTE — Patient Instructions (Signed)
Continue Pentasa.  Continue omeprazole.  Please follow up with Dr Hilarie Fredrickson in 1 year.  If you are age 76 or older, your body mass index should be between 23-30. Your Body mass index is 25.6 kg/m. If this is out of the aforementioned range listed, please consider follow up with your Primary Care Provider.  Due to recent changes in healthcare laws, you may see the results of your imaging and laboratory studies on MyChart before your provider has had a chance to review them.  We understand that in some cases there may be results that are confusing or concerning to you. Not all laboratory results come back in the same time frame and the provider may be waiting for multiple results in order to interpret others.  Please give [place] 48 hours in order for your provider to thoroughly review all the results before contacting the office for clarification of your results.

## 2020-06-27 NOTE — Progress Notes (Signed)
   Subjective:    Patient ID: Bridget Reyes, female    DOB: 11-Sep-1945, 75 y.o.   MRN: [id number]  HPI Bridget Reyes is a 75 year old female with a history of ileal Crohn's disease, GERD with hiatal hernia and LPR symptoms, history of migraines, hyperlipidemia is here for follow-up.  She was last seen 1 year ago.  She is here alone today.  She reports she has been doing very well.  She continues Pentasa 1.5 g twice daily and while this is expensive it is not burdensome.  She has regular bowel movements without any significant abdominal pain.  No blood in stool or melena.  Omeprazole is working well for her reflux and LPR symptoms at 40 mg once per day.  No dysphagia or odynophagia.  She had a bone density test recently which was normal.  Review of Systems As per HPI, otherwise negative  Current Medications, Allergies, Past Medical History, Past Surgical History, Family History and Social History were reviewed in [employer] record.     Objective:   Physical Exam BP 134/62 (BP Location: Left Arm, Patient Position: Sitting, Cuff Size: Normal)   Pulse 69   Ht 5' 5.25" (1.657 m)   Wt 155 lb (70.3 kg)   LMP 04/19/1992   BMI 25.60 kg/m  Gen: awake, alert, NAD HEENT: anicteric CV: RRR, no mrg Pulm: CTA b/l Abd: soft, NT/ND, +BS throughout Ext: no c/c/e Neuro: nonfocal   CMP     Component Value Date/Time   NA 137 12/21/2019 0929   K 4.5 12/21/2019 0929   CL 99 12/21/2019 0929   CO2 29 12/21/2019 0929   GLUCOSE 93 12/21/2019 0929   GLUCOSE 91 04/27/2006 1058   BUN 19 12/21/2019 0929   CREATININE 0.77 12/21/2019 0929   CALCIUM 9.5 12/21/2019 0929   PROT 7.1 03/10/2020 0829   ALBUMIN 4.5 12/19/2018 0934   AST 20 03/10/2020 0829   ALT 18 03/10/2020 0829   ALKPHOS 79 12/19/2018 0934   BILITOT 0.4 03/10/2020 0829   GFRNONAA 76 12/21/2019 0929   GFRAA 88 12/21/2019 0929        Assessment & Plan:  75 year old female with a history of ileal Crohn's  disease, GERD with hiatal hernia and LPR symptoms, history of migraines, hyperlipidemia is here for follow-up.   1.  Ileal Crohn's --mild though when Pentasa was discontinued previously symptoms return.  I do feel that Pentasa is beneficial for her going forward and she is in agreement.  Her creatinine is normal. --Continue Pentasa 1.5 g twice daily  2.  GERD with LPR --controlled on omeprazole 40 mg daily.  We tried to reduce the dose previously and her GERD and reflux symptoms recurred.  We did discuss the risks, benefits of PPI long-term today.  We discussed the risk of calcium and magnesium metabolism alteration as well as the association with metabolic bone disease.  She is following regular bone density screenings.  After this discussion I feel reasonable to continue current dose and she is in agreement --Continue omeprazole 40 mg daily  3.  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

## 2020-07-07 ENCOUNTER — Encounter: Payer: Self-pay | Admitting: Internal Medicine

## 2020-07-07 ENCOUNTER — Ambulatory Visit (INDEPENDENT_AMBULATORY_CARE_PROVIDER_SITE_OTHER): Payer: Medicare Other | Admitting: Internal Medicine

## 2020-07-07 ENCOUNTER — Other Ambulatory Visit: Payer: Self-pay

## 2020-07-07 VITALS — BP 126/60 | HR 67 | Temp 98.3°F | Ht 65.25 in | Wt 155.6 lb

## 2020-07-07 DIAGNOSIS — S40862A Insect bite (nonvenomous) of left upper arm, initial encounter: Secondary | ICD-10-CM

## 2020-07-07 DIAGNOSIS — W57XXXA Bitten or stung by nonvenomous insect and other nonvenomous arthropods, initial encounter: Secondary | ICD-10-CM | POA: Diagnosis not present

## 2020-07-07 MED ORDER — DOXYCYCLINE HYCLATE 100 MG PO TABS: ORAL_TABLET | ORAL | 0 refills | Status: DC

## 2020-07-07 NOTE — Progress Notes (Signed)
Chief Complaint  Patient presents with  . Insect Bite    Patient states she was bitten by a tick, noticed yesterday while showering    HPI: Bridget Reyes 75 y.o. come in for sda  Attached l more than a day ,? Noted small tick in the left antecubital fossa removed it and is just itchy at the area no fever systemic symptoms No known exposures working in the yard etc. Wonders if she should have prophylaxis.  Her husband has a history of a tick bite on was put on an antibiotic. No dog exposure. ROS: See pertinent positives and negatives per HPI.  Past Medical History:  Diagnosis Date  . Allergy   . Anxiety    not treated   . Arthritis    knees, neck - no meds  . Cataract    bilateral - MD just watchning  . Colon polyps   . Crohn's   . Eczema   . Esophageal reflux   . Gastritis   . Gout   . Hiatal hernia   . Hyperlipemia   . Ileitis   . Migraines    tx with naproxen - last one 4 yrs ago  . Rosacea   . Skin cancer of face    scca removed     Family History  Problem Relation Age of Onset  . Parkinsonism Mother   . Lung cancer Mother   . Heart disease Father        died chf in 42 s  . Hypertension Father   . Congestive Heart Failure Father   . Breast cancer Cousin   . Colon polyps Sister   . Colon cancer Neg Hx   . Esophageal cancer Neg Hx   . Rectal cancer Neg Hx   . Stomach cancer Neg Hx     Social History   Socioeconomic History  . Marital status: Married    Spouse name: Not on file  . Number of children: 2  . Years of education: Not on file  . Highest education level: Not on file  Occupational History  . Occupation: retired     Comment: Games developer: UNEMPLOYED  Tobacco Use  . Smoking status: Never Smoker  . Smokeless tobacco: Never Used  Vaping Use  . Vaping Use: Never used  Substance and Sexual Activity  . Alcohol use: Yes    Alcohol/week: 1.0 - 2.0 standard drink    Types: 1 - 2 Glasses of wine per week  . Drug use: No   . Sexual activity: Yes    Partners: Male    Birth control/protection: Surgical    Comment: hysterectomy  Other Topics Concern  . Not on file  Social History Narrative   hh  Of 2       Dog    Married    G2P2   Social Determinants of Health   Financial Resource Strain: Not on file  Food Insecurity: Not on file  Transportation Needs: Not on file  Physical Activity: Not on file  Stress: Not on file  Social Connections: Not on file    Outpatient Medications Prior to Visit  Medication Sig Dispense Refill  . acetaminophen (TYLENOL) 500 MG tablet Take 500 mg by mouth every 6 (six) hours as needed.    . Azelaic Acid (FINACEA) 15 % cream After skin is thoroughly washed and patted dry, gently but thoroughly massage a thin film of azelaic acid cream into the affected area twice daily,  in the morning and evening. 50 g 11  . citalopram (CELEXA) 20 MG tablet Take 1 tablet (20 mg total) by mouth daily. 90 tablet 3  . ipratropium (ATROVENT) 0.06 % nasal spray instill 2 sprays into each nostril twice a day to three times a day 45 mL prn  . MELATONIN PO Take 10 mg by mouth daily.    . mesalamine (PENTASA) 500 MG CR capsule TAKE 3 CAPSULES BY MOUTH TWICE DAILY 540 capsule 3  . Misc. Devices (VAGINAL SUPPOSITORY APPLICATOR) MISC by Does not apply route. Vitamin E As needed    . MULTIPLE VITAMIN PO Take 1 tablet by mouth daily.    . NON FORMULARY as needed. CBD Cream- applies prn to muscles, joints    . omeprazole (PRILOSEC) 40 MG capsule TAKE 1 CAPSULE(40 MG) BY MOUTH DAILY 90 capsule 3  . simvastatin (ZOCOR) 40 MG tablet Take 1 tablet (40 mg total) by mouth daily. 90 tablet 3  . valACYclovir (VALTREX) 1000 MG tablet Take 2 tablets (2,000 mg total) by mouth 2 (two) times daily as needed. 30 tablet 11  . acetic acid-hydrocortisone (VOSOL-HC) OTIC solution Pt uses as needed (Patient not taking: No sig reported)    . fluticasone (FLONASE) 50 MCG/ACT nasal spray Place 2 sprays into the nose daily. 16  g 11  . NONFORMULARY OR COMPOUNDED ITEM 200u/ml.  One pv three times weekly.k  #36/4RF 36 each 4   No facility-administered medications prior to visit.     EXAM:  BP 126/60 (BP Location: Left Arm, Patient Position: Sitting, Cuff Size: Normal)   Pulse 67   Temp 98.3 F (36.8 C) (Oral)   Ht 5' 5.25" (1.657 m)   Wt 155 lb 9.6 oz (70.6 kg)   LMP 04/19/1992   SpO2 95%   BMI 25.70 kg/m   Body mass index is 25.7 kg/m.  GENERAL: vitals reviewed and listed above, alert, oriented, appears well hydrated and in no acute distress HEENT: atraumatic, conjunctiva  clear, no obvious abnormalities on inspection of external nose and ears OP : Masked NECK: no obvious masses on inspection palpation  vement left antecubital fossa with 2 mm red spot no rash. MS: moves all extremities without noticeable focal  abnormality PSYCH: pleasant and cooperative, no obvious depression or anxiety She has the take in a plastic baggy it is small 2 to 3 mm but I think somewhat round but did no white spots and not engorged. BP Readings from Last 3 Encounters:  07/07/20 126/60  06/27/20 134/62  12/21/19 (!) 144/78    ASSESSMENT AND PLAN:  Discussed the following assessment and plan:  Tick bite of left upper arm, initial encounter Seems like tick bite is not a high risk based on location lack of length of attachment and uncertain identification of type of tick for Lyme disease however printed prescription for her in case but take w in 72 hours of removal  if she wishes to do that. Patient does not preclude other tickborne diseases.  If gets fever rash flulike symptoms etc. get in contact with [place].  -Patient advised to return or notify health care team  if  new concerns arise.  Patient Instructions   One time prophylaxis is for  P[revention in lyme disease  If  Given early after bite   Not sure this is the correct tick  Look for fever rash  Flu like illness     Tick Bite Information, Adult  Ticks are  insects that can bite. Most  ticks live in shrubs and grassy areas. They climb onto people and animals that go by. Then they bite. Some ticks carry germs that can make you sick. How can I prevent tick bites? Take these steps: Use insect repellent  Use an insect repellent that has 20% or higher of the ingredients DEET, picaridin, or IR3535. Follow the instructions on the label. Put it on: ? Bare skin. ? The tops of your boots. ? Your pant legs. ? The ends of your sleeves.  If you use an insect repellent that has the ingredient permethrin, follow the instructions on the label. Put it on: ? Clothing. ? Boots. ? Supplies or outdoor gear. ? Tents. When you are outside  Wear long sleeves and long pants.  Wear light-colored clothes.  Tuck your pant legs into your socks.  Stay in the middle of the trail. Do not touch the bushes.  Avoid walking through long grass.  Check for ticks on your clothes, hair, and skin often while you are outside. Before going inside your house, check your clothes, skin, head, neck, armpits, waist, groin, and joint areas. When you go indoors  Check your clothes for ticks. Dry your clothes in a dryer on high heat for 10 minutes or more. If clothes are damp, additional time may be needed.  Wash your clothes right away if they need to be washed. Use hot water.  Check your pets and outdoor gear.  Shower right away.  Check your body for ticks. Do a full body check using a mirror. What is the right way to remove a tick? Remove the tick from your skin as soon as possible. Do not remove the tick with your bare fingers.  To remove a tick that is crawling on your skin: ? Go outdoors and brush the tick off. ? Use tape or a lint roller.  To remove a tick that is biting: 1. Wash your hands. 2. If you have latex gloves, put them on. 3. Use tweezers, curved forceps, or a tick-removal tool to grasp the tick. Grasp the tick as close to your skin and as close to the  tick's head as possible. 4. Gently pull up until the tick lets go.  Try to keep the tick's head attached to its body.  Do not twist or jerk the tick.  Do not squeeze or crush the tick. Do not try to remove a tick with heat, alcohol, petroleum jelly, or fingernail polish.   What should I do after taking out a tick?  Throw away the tick. Do not crush a tick with your fingers.  Clean the bite area and your hands with soap and water, rubbing alcohol, or an iodine wash.  If an antiseptic cream or ointment is available, apply a small amount to the bite area.  Wash and disinfect any instruments that you used to remove the tick. How should I get rid of a live tick? To dispose of a live tick, use one of these methods:  Place the tick in rubbing alcohol.  Place the tick in a bag or container you can close tightly.  Wrap the tick tightly in tape.  Flush the tick down the toilet. Contact a doctor if:  You have symptoms, such as: ? A fever or chills. ? A red rash that makes a circle (bull's-eye rash) in the bite area. ? Redness and swelling where the tick bit you. ? Headache. ? Pain in a muscle, joint, or bone. ? Being more tired than  normal. ? Trouble walking or moving your legs. ? Numbness in your legs. ? Tender and swollen lymph glands.  A part of a tick breaks off and gets stuck in your skin. Get help right away if:  You cannot remove a tick.  You cannot move (have paralysis) or feel weak.  You are feeling worse or have new symptoms.  You find a tick that is biting you and filled with blood. This is important if you are in an area where diseases from ticks are common. Summary  Ticks may carry germs that can make you sick.  To prevent tick bites wear long sleeves, long pants, and light colors. Use insect repellent. Follow the instructions on the label.  If the tick is biting, do not try to remove it with heat, alcohol, petroleum jelly, or fingernail polish.  Use  tweezers, curved forceps, or a tick-removal tool to grasp the tick. Gently pull up until the tick lets go. Do not twist or jerk the tick. Do not squeeze or crush the tick.  If you have symptoms, contact a doctor. This information is not intended to replace advice given to you by your health care provider. Make sure you discuss any questions you have with your health care provider. Document Revised: 04/02/2019 Document Reviewed: 04/02/2019 Elsevier Patient Education  2021 [place]. Letti Towell M.D.

## 2020-07-07 NOTE — Patient Instructions (Addendum)
One time prophylaxis is for  P[revention in lyme disease  If  Given early after bite   Not sure this is the correct tick  Look for fever rash  Flu like illness     Tick Bite Information, Adult  Ticks are insects that can bite. Most ticks live in shrubs and grassy areas. They climb onto people and animals that go by. Then they bite. Some ticks carry germs that can make you sick. How can I prevent tick bites? Take these steps: Use insect repellent  Use an insect repellent that has 20% or higher of the ingredients DEET, picaridin, or IR3535. Follow the instructions on the label. Put it on: ? Bare skin. ? The tops of your boots. ? Your pant legs. ? The ends of your sleeves.  If you use an insect repellent that has the ingredient permethrin, follow the instructions on the label. Put it on: ? Clothing. ? Boots. ? Supplies or outdoor gear. ? Tents. When you are outside  Wear long sleeves and long pants.  Wear light-colored clothes.  Tuck your pant legs into your socks.  Stay in the middle of the trail. Do not touch the bushes.  Avoid walking through long grass.  Check for ticks on your clothes, hair, and skin often while you are outside. Before going inside your house, check your clothes, skin, head, neck, armpits, waist, groin, and joint areas. When you go indoors  Check your clothes for ticks. Dry your clothes in a dryer on high heat for 10 minutes or more. If clothes are damp, additional time may be needed.  Wash your clothes right away if they need to be washed. Use hot water.  Check your pets and outdoor gear.  Shower right away.  Check your body for ticks. Do a full body check using a mirror. What is the right way to remove a tick? Remove the tick from your skin as soon as possible. Do not remove the tick with your bare fingers.  To remove a tick that is crawling on your skin: ? Go outdoors and brush the tick off. ? Use tape or a lint roller.  To remove a tick that  is biting: 1. Wash your hands. 2. If you have latex gloves, put them on. 3. Use tweezers, curved forceps, or a tick-removal tool to grasp the tick. Grasp the tick as close to your skin and as close to the tick's head as possible. 4. Gently pull up until the tick lets go.  Try to keep the tick's head attached to its body.  Do not twist or jerk the tick.  Do not squeeze or crush the tick. Do not try to remove a tick with heat, alcohol, petroleum jelly, or fingernail polish.   What should I do after taking out a tick?  Throw away the tick. Do not crush a tick with your fingers.  Clean the bite area and your hands with soap and water, rubbing alcohol, or an iodine wash.  If an antiseptic cream or ointment is available, apply a small amount to the bite area.  Wash and disinfect any instruments that you used to remove the tick. How should I get rid of a live tick? To dispose of a live tick, use one of these methods:  Place the tick in rubbing alcohol.  Place the tick in a bag or container you can close tightly.  Wrap the tick tightly in tape.  Flush the tick down the toilet. Contact a  doctor if:  You have symptoms, such as: ? A fever or chills. ? A red rash that makes a circle (bull's-eye rash) in the bite area. ? Redness and swelling where the tick bit you. ? Headache. ? Pain in a muscle, joint, or bone. ? Being more tired than normal. ? Trouble walking or moving your legs. ? Numbness in your legs. ? Tender and swollen lymph glands.  A part of a tick breaks off and gets stuck in your skin. Get help right away if:  You cannot remove a tick.  You cannot move (have paralysis) or feel weak.  You are feeling worse or have new symptoms.  You find a tick that is biting you and filled with blood. This is important if you are in an area where diseases from ticks are common. Summary  Ticks may carry germs that can make you sick.  To prevent tick bites wear long sleeves,  long pants, and light colors. Use insect repellent. Follow the instructions on the label.  If the tick is biting, do not try to remove it with heat, alcohol, petroleum jelly, or fingernail polish.  Use tweezers, curved forceps, or a tick-removal tool to grasp the tick. Gently pull up until the tick lets go. Do not twist or jerk the tick. Do not squeeze or crush the tick.  If you have symptoms, contact a doctor. This information is not intended to replace advice given to you by your health care provider. Make sure you discuss any questions you have with your health care provider. Document Revised: 04/02/2019 Document Reviewed: 04/02/2019 Elsevier Patient Education  2021 [employer].

## 2020-07-15 DIAGNOSIS — H5203 Hypermetropia, bilateral: Secondary | ICD-10-CM | POA: Diagnosis not present

## 2020-07-15 DIAGNOSIS — H2513 Age-related nuclear cataract, bilateral: Secondary | ICD-10-CM | POA: Diagnosis not present

## 2020-07-21 ENCOUNTER — Ambulatory Visit: Payer: Medicare Other

## 2020-08-12 DIAGNOSIS — H2511 Age-related nuclear cataract, right eye: Secondary | ICD-10-CM | POA: Diagnosis not present

## 2020-08-18 ENCOUNTER — Ambulatory Visit (HOSPITAL_BASED_OUTPATIENT_CLINIC_OR_DEPARTMENT_OTHER): Payer: Medicare Other | Admitting: Obstetrics & Gynecology

## 2020-09-01 ENCOUNTER — Telehealth: Payer: Self-pay | Admitting: Internal Medicine

## 2020-09-01 NOTE — Telephone Encounter (Signed)
Left message for patient to call back and schedule Medicare Annual Wellness Visit (AWV) either virtually or in office.   Last AWV 11/25/15  please schedule at anytime with [facility] Nurse Health Advisor 1 or 2   This should be a 45 minute visit.

## 2020-10-13 ENCOUNTER — Other Ambulatory Visit: Payer: Self-pay | Admitting: Obstetrics & Gynecology

## 2020-10-13 DIAGNOSIS — Z1231 Encounter for screening mammogram for malignant neoplasm of breast: Secondary | ICD-10-CM

## 2020-11-04 ENCOUNTER — Ambulatory Visit (HOSPITAL_BASED_OUTPATIENT_CLINIC_OR_DEPARTMENT_OTHER): Payer: Medicare Other | Admitting: Obstetrics & Gynecology

## 2020-12-16 ENCOUNTER — Ambulatory Visit (HOSPITAL_BASED_OUTPATIENT_CLINIC_OR_DEPARTMENT_OTHER): Payer: Medicare Other | Admitting: Obstetrics & Gynecology

## 2020-12-17 ENCOUNTER — Other Ambulatory Visit: Payer: Self-pay

## 2020-12-17 ENCOUNTER — Other Ambulatory Visit: Payer: Self-pay | Admitting: Internal Medicine

## 2020-12-17 MED ORDER — SIMVASTATIN 40 MG PO TABS: 40.0000 mg | ORAL_TABLET | Freq: Every day | ORAL | 0 refills | Status: DC

## 2020-12-22 NOTE — Progress Notes (Signed)
Chief Complaint  Patient presents with   Annual Exam     HPI: Patient  Bridget Reyes  75 y.o. comes in today for Preventive Health Care visit  Doing  pretty well Taking melatonin and supp for sleep  Citalopram helpful needs rx  Ipratropium  bery helpful for runny nose  HLD taking simva needs refill Valtrex  perinasal outbreaks  valtrex works well getting about monthly outbreaks Sees Gi yearly  No new gout. Right foot arch still bothers her . ? If bunion related   Health Maintenance  Topic Date Due   Zoster Vaccines- Shingrix (1 of 2) Never done   COVID-19 Vaccine (4 - Booster for Pfizer series) 03/18/2020   INFLUENZA VACCINE  11/17/2020   COLONOSCOPY (Pts 45-67yr Insurance coverage will need to be confirmed)  10/10/2023   TETANUS/TDAP  11/14/2023   DEXA SCAN  Completed   Hepatitis C Screening  Completed   PNA vac Low Risk Adult  Completed   HPV VACCINES  Aged Out   Health Maintenance Review LIFESTYLE:  Exercise:   Tobacco/ETS:n Alcohol: r Sugar beverages: Sleep:6-7 hours  Drug use: no HH of 3 no pets    ROS:  GREST of 12 system review negative except as per HPI   Past Medical History:  Diagnosis Date   Allergy    Anxiety    not treated    Arthritis    knees, neck - no meds   Cataract    bilateral - MD just watchning   Colon polyps    Crohn's    Eczema    Esophageal reflux    Gastritis    Gout    Hiatal hernia    Hyperlipemia    Ileitis    Migraines    tx with naproxen - last one 4 yrs ago   Rosacea    Skin cancer of face    scca removed     Past Surgical History:  Procedure Laterality Date   BREAST BIOPSY Left 1980   COLONOSCOPY  08/2015   Pyrtle -polyp   DILATION AND CURETTAGE OF UTERUS     POLYPECTOMY     TONSILLECTOMY     TOTAL VAGINAL HYSTERECTOMY  1994   UPPER GASTROINTESTINAL ENDOSCOPY     UPPER GI ENDOSCOPY     hiatal hernia   WISDOM TOOTH EXTRACTION      Family History  Problem Relation Age of Onset   Parkinsonism  Mother    Lung cancer Mother    Heart disease Father        died chf in 713 s  Hypertension Father    Congestive Heart Failure Father    Breast cancer Cousin    Colon polyps Sister    Colon cancer Neg Hx    Esophageal cancer Neg Hx    Rectal cancer Neg Hx    Stomach cancer Neg Hx     Social History   Socioeconomic History   Marital status: Married    Spouse name: Not on file   Number of children: 2   Years of education: Not on file   Highest education level: Not on file  Occupational History   Occupation: retired     Comment: school tProduct manager UNEMPLOYED  Tobacco Use   Smoking status: Never   Smokeless tobacco: Never  Vaping Use   Vaping Use: Never used  Substance and Sexual Activity   Alcohol use: Yes    Alcohol/week: 1.0 - 2.0 standard  drink    Types: 1 - 2 Glasses of wine per week   Drug use: No   Sexual activity: Yes    Partners: Male    Birth control/protection: Surgical    Comment: hysterectomy  Other Topics Concern   Not on file  Social History Narrative   hh  Of 2       Dog    Married    G2P2   Social Determinants of Health   Financial Resource Strain: Not on file  Food Insecurity: Not on file  Transportation Needs: Not on file  Physical Activity: Not on file  Stress: Not on file  Social Connections: Not on file    Outpatient Medications Prior to Visit  Medication Sig Dispense Refill   acetaminophen (TYLENOL) 500 MG tablet Take 500 mg by mouth every 6 (six) hours as needed.     Azelaic Acid (FINACEA) 15 % cream After skin is thoroughly washed and patted dry, gently but thoroughly massage a thin film of azelaic acid cream into the affected area twice daily, in the morning and evening. 50 g 11   MELATONIN PO Take 10 mg by mouth daily.     mesalamine (PENTASA) 500 MG CR capsule TAKE 3 CAPSULES BY MOUTH TWICE DAILY 540 capsule 3   Misc. Devices (VAGINAL SUPPOSITORY APPLICATOR) MISC by Does not apply route. Vitamin E As needed      MULTIPLE VITAMIN PO Take 1 tablet by mouth daily.     NON FORMULARY as needed. CBD Cream- applies prn to muscles, joints     omeprazole (PRILOSEC) 40 MG capsule TAKE 1 CAPSULE(40 MG) BY MOUTH DAILY 90 capsule 3   citalopram (CELEXA) 20 MG tablet TAKE 1 TABLET(20 MG) BY MOUTH DAILY 90 tablet 0   doxycycline (VIBRA-TABS) 100 MG tablet Take 200 mg  For tick bite 2 tablet 0   ipratropium (ATROVENT) 0.06 % nasal spray instill 2 sprays into each nostril twice a day to three times a day 45 mL prn   simvastatin (ZOCOR) 40 MG tablet Take 1 tablet (40 mg total) by mouth daily. 90 tablet 0   valACYclovir (VALTREX) 1000 MG tablet Take 2 tablets (2,000 mg total) by mouth 2 (two) times daily as needed. 30 tablet 11   No facility-administered medications prior to visit.     EXAM:  BP (!) 146/68 (BP Location: Left Arm, Patient Position: Sitting, Cuff Size: Normal)   Pulse 68   Temp 98 F (36.7 C) (Oral)   Ht 5' 5"  (1.651 m)   Wt 157 lb (71.2 kg)   LMP 04/19/1992   SpO2 95%   BMI 26.13 kg/m   Body mass index is 26.13 kg/m. Wt Readings from Last 3 Encounters:  12/23/20 157 lb (71.2 kg)  07/07/20 155 lb 9.6 oz (70.6 kg)  06/27/20 155 lb (70.3 kg)    Physical Exam: Vital signs reviewed [id number] is a well-developed well-nourished alert cooperative    who appearsr stated age in no acute distress.  HEENT: normocephalic atraumatic , Eyes: PERRL EOM's full, conjunctiva clear, Nares: paten,t no deformity discharge or tenderness., Ears: no deformity EAC's clear Mouth:masked  NECK: supple without masses, thyromegaly or bruits. CHEST/PULM:  Clear to auscultation and percussion breath sounds equal no wheeze , rales or rhonchi. No chest wall deformities or tenderness. Breast: normal by inspection . No dimpling, discharge, masses, tenderness or discharge . CV: PMI is nondisplaced, S1 S2 no gallops, murmurs, rubs. Peripheral pulses are full without delay.No JVD .  ABDOMEN:  Bowel sounds normal nontender  No  guard or rebound, no hepato splenomegal no CVA tenderness.   Extremtities:  No clubbing cyanosis or edema, no acute joint swelling or redness no focal atrophy mild bunion r more than left  NEURO:  Oriented x3, cranial nerves 3-12 appear to be intact, no obvious focal weakness,gait within normal limits no abnormal reflexes or asymmetrical SKIN: No acute rashes normal turgor, color, no bruising or petechiae. PSYCH: Oriented, good eye contact, no obvious depression anxiety, cognition and judgment appear normal. LN: no cervical axillary inguinal adenopathy  Lab Results  Component Value Date   WBC 5.6 12/23/2020   HGB 13.6 12/23/2020   HCT 40.0 12/23/2020   PLT 238.0 12/23/2020   GLUCOSE 98 12/23/2020   CHOL 148 12/23/2020   TRIG 100.0 12/23/2020   HDL 44.90 12/23/2020   LDLCALC 83 12/23/2020   ALT 21 12/23/2020   AST 21 12/23/2020   NA 135 12/23/2020   K 4.2 12/23/2020   CL 99 12/23/2020   CREATININE 0.90 12/23/2020   BUN 19 12/23/2020   CO2 29 12/23/2020   TSH 6.66 (H) 12/23/2020   HGBA1C 5.3 12/21/2019    BP Readings from Last 3 Encounters:  12/23/20 (!) 146/68  07/07/20 126/60  06/27/20 134/62   Lab Results  Component Value Date   VITAMINB12 595 12/21/2019    Lab plan reviewed with patient   ASSESSMENT AND PLAN:  Discussed the following assessment and plan:    ICD-10-CM   1. Visit for preventive health examination  Z00.00     2. Medication management  T01.779 Basic metabolic panel    CBC with Differential/Platelet    Hepatic function panel    Lipid panel    TSH    Uric acid    Uric acid    TSH    Lipid panel    Hepatic function panel    CBC with Differential/Platelet    Basic metabolic panel    3. Crohn's disease of both small and large intestine without complication (HCC)  T90.30 Basic metabolic panel    CBC with Differential/Platelet    Hepatic function panel    Lipid panel    TSH    Uric acid    Uric acid    TSH    Lipid panel    Hepatic  function panel    CBC with Differential/Platelet    Basic metabolic panel    4. Hyperlipidemia, unspecified hyperlipidemia type  S92.3 Basic metabolic panel    CBC with Differential/Platelet    Hepatic function panel    Lipid panel    TSH    Uric acid    Uric acid    TSH    Lipid panel    Hepatic function panel    CBC with Differential/Platelet    Basic metabolic panel    5. Fasting hyperglycemia  R00.76 Basic metabolic panel    CBC with Differential/Platelet    Hepatic function panel    Lipid panel    TSH    Uric acid    Uric acid    TSH    Lipid panel    Hepatic function panel    CBC with Differential/Platelet    Basic metabolic panel    6. Elevated blood pressure reading  A26.3 Basic metabolic panel    CBC with Differential/Platelet    Hepatic function panel    Lipid panel    TSH    Uric acid    Uric acid  TSH    Lipid panel    Hepatic function panel    CBC with Differential/Platelet    Basic metabolic panel    7. Right foot pain  P38.250 Basic metabolic panel    CBC with Differential/Platelet    Hepatic function panel    Lipid panel    TSH    Uric acid    Uric acid    TSH    Lipid panel    Hepatic function panel    CBC with Differential/Platelet    Basic metabolic panel    8. History of gout  N39.76 Basic metabolic panel    CBC with Differential/Platelet    Hepatic function panel    Lipid panel    TSH    Uric acid    Uric acid    TSH    Lipid panel    Hepatic function panel    CBC with Differential/Platelet    Basic metabolic panel    Monitor bp at home rx as  indicated she says lower at home when checks   Send in readings to confirm Will refill simva citalopram atrovent valtrex  90 days  Return in about 1 year (around 12/23/2021) for depending on results.  Patient Care Team: Thomasine Klutts, Standley Brooking, MD as PCP - General Pedro Earls, MD (Family Medicine) Martinique, Amy, MD (Dermatology) Megan Salon, MD (Gynecology) Orie Rout, MD as  Referring Physician (Specialist) Marygrace Drought, MD (Ophthalmology) Jerrell Belfast, MD as Consulting Physician (Otolaryngology) Marica Otter, [place] (Optometry) Pyrtle, Lajuan Lines, MD as Consulting Physician (Gastroenterology) Patient Instructions  Good to see you today  Consider getting back to ortho about the foot arch pain .   Continue lifestyle intervention healthy eating and exercise . Will refill meds as discussed  Lab to include uric acid  level today .   Get derm to check the lip area but seems like a benign process.   Plan yearly check if lab ok   Check Bp  twice a day 2 per sitting for 3-5 days    and then periodically ... can send in readings    below 140/90    better 130/80 and below average.   Fu if readings  remain high  over time.   Standley Brooking. Tsion Inghram M.D.

## 2020-12-23 ENCOUNTER — Encounter: Payer: Self-pay | Admitting: Internal Medicine

## 2020-12-23 ENCOUNTER — Other Ambulatory Visit: Payer: Self-pay

## 2020-12-23 ENCOUNTER — Ambulatory Visit (INDEPENDENT_AMBULATORY_CARE_PROVIDER_SITE_OTHER): Payer: Medicare Other | Admitting: Internal Medicine

## 2020-12-23 VITALS — BP 146/68 | HR 68 | Temp 98.0°F | Ht 65.0 in | Wt 157.0 lb

## 2020-12-23 DIAGNOSIS — Z0000 Encounter for general adult medical examination without abnormal findings: Secondary | ICD-10-CM

## 2020-12-23 DIAGNOSIS — E785 Hyperlipidemia, unspecified: Secondary | ICD-10-CM

## 2020-12-23 DIAGNOSIS — Z8739 Personal history of other diseases of the musculoskeletal system and connective tissue: Secondary | ICD-10-CM

## 2020-12-23 DIAGNOSIS — R030 Elevated blood-pressure reading, without diagnosis of hypertension: Secondary | ICD-10-CM | POA: Diagnosis not present

## 2020-12-23 DIAGNOSIS — M79671 Pain in right foot: Secondary | ICD-10-CM | POA: Diagnosis not present

## 2020-12-23 DIAGNOSIS — R7301 Impaired fasting glucose: Secondary | ICD-10-CM

## 2020-12-23 DIAGNOSIS — R946 Abnormal results of thyroid function studies: Secondary | ICD-10-CM

## 2020-12-23 DIAGNOSIS — Z79899 Other long term (current) drug therapy: Secondary | ICD-10-CM

## 2020-12-23 DIAGNOSIS — K5080 Crohn's disease of both small and large intestine without complications: Secondary | ICD-10-CM

## 2020-12-23 LAB — CBC WITH DIFFERENTIAL/PLATELET
Basophils Absolute: 0.1 10*3/uL (ref 0.0–0.1)
Basophils Relative: 1.1 % (ref 0.0–3.0)
Eosinophils Absolute: 0.1 10*3/uL (ref 0.0–0.7)
Eosinophils Relative: 2.3 % (ref 0.0–5.0)
HCT: 40 % (ref 36.0–46.0)
Hemoglobin: 13.6 g/dL (ref 12.0–15.0)
Lymphocytes Relative: 22.6 % (ref 12.0–46.0)
Lymphs Abs: 1.3 10*3/uL (ref 0.7–4.0)
MCHC: 34 g/dL (ref 30.0–36.0)
MCV: 93.9 fl (ref 78.0–100.0)
Monocytes Absolute: 0.6 10*3/uL (ref 0.1–1.0)
Monocytes Relative: 10.5 % (ref 3.0–12.0)
Neutro Abs: 3.6 10*3/uL (ref 1.4–7.7)
Neutrophils Relative %: 63.5 % (ref 43.0–77.0)
Platelets: 238 10*3/uL (ref 150.0–400.0)
RBC: 4.26 Mil/uL (ref 3.87–5.11)
RDW: 12.8 % (ref 11.5–15.5)
WBC: 5.6 10*3/uL (ref 4.0–10.5)

## 2020-12-23 LAB — LIPID PANEL
Cholesterol: 148 mg/dL (ref 0–200)
HDL: 44.9 mg/dL (ref >39.00)
LDL Cholesterol: 83 mg/dL (ref 0–99)
NonHDL: 102.92
Total CHOL/HDL Ratio: 3
Triglycerides: 100 mg/dL (ref 0.0–149.0)
VLDL: 20 mg/dL (ref 0.0–40.0)

## 2020-12-23 LAB — HEPATIC FUNCTION PANEL
ALT: 21 U/L (ref 0–35)
AST: 21 U/L (ref 0–37)
Albumin: 4.6 g/dL (ref 3.5–5.2)
Alkaline Phosphatase: 78 U/L (ref 39–117)
Bilirubin, Direct: 0.1 mg/dL (ref 0.0–0.3)
Total Bilirubin: 0.5 mg/dL (ref 0.2–1.2)
Total Protein: 7.6 g/dL (ref 6.0–8.3)

## 2020-12-23 LAB — BASIC METABOLIC PANEL
BUN: 19 mg/dL (ref 6–23)
CO2: 29 mEq/L (ref 19–32)
Calcium: 9.7 mg/dL (ref 8.4–10.5)
Chloride: 99 mEq/L (ref 96–112)
Creatinine, Ser: 0.9 mg/dL (ref 0.40–1.20)
GFR: 62.44 mL/min (ref >60.00)
Glucose, Bld: 98 mg/dL (ref 70–99)
Potassium: 4.2 mEq/L (ref 3.5–5.1)
Sodium: 135 mEq/L (ref 135–145)

## 2020-12-23 LAB — URIC ACID: Uric Acid, Serum: 4 mg/dL (ref 2.4–7.0)

## 2020-12-23 LAB — TSH: TSH: 6.66 u[IU]/mL — ABNORMAL HIGH (ref 0.35–5.50)

## 2020-12-23 MED ORDER — VALACYCLOVIR HCL 1 G PO TABS: 2000.0000 mg | ORAL_TABLET | Freq: Two times a day (BID) | ORAL | 3 refills | Status: DC | PRN

## 2020-12-23 MED ORDER — IPRATROPIUM BROMIDE 0.06 % NA SOLN: NASAL | 99 refills | Status: DC

## 2020-12-23 MED ORDER — CITALOPRAM HYDROBROMIDE 20 MG PO TABS: ORAL_TABLET | ORAL | 3 refills | Status: DC

## 2020-12-23 MED ORDER — SIMVASTATIN 40 MG PO TABS: 40.0000 mg | ORAL_TABLET | Freq: Every day | ORAL | 3 refills | Status: DC

## 2020-12-23 NOTE — Progress Notes (Signed)
Labs normal or at goal except  thyroid is slightly off . Usually no symptoms at this level. And  no intervention except follow up lab at this time  Make sure not taking biotin  for 1-2 days and then  check  order tsh and free t4 ( dx abnormal thyroid lab) uric acid is good

## 2020-12-23 NOTE — Patient Instructions (Addendum)
Good to see you today  Consider getting back to ortho about the foot arch pain .   Continue lifestyle intervention healthy eating and exercise . Will refill meds as discussed  Lab to include uric acid  level today .   Get derm to check the lip area but seems like a benign process.   Plan yearly check if lab ok   Check Bp  twice a day 2 per sitting for 3-5 days    and then periodically ... can send in readings    below 140/90    better 130/80 and below average.   Fu if readings  remain high  over time.

## 2020-12-24 NOTE — Addendum Note (Signed)
Addended by: Nilda Riggs on: 12/24/2020 08:23 AM   Modules accepted: Orders

## 2020-12-27 NOTE — Telephone Encounter (Signed)
Do  follow up  thyroid lab in 1-2 months or so

## 2021-01-02 ENCOUNTER — Other Ambulatory Visit: Payer: Self-pay

## 2021-01-02 ENCOUNTER — Ambulatory Visit
Admission: RE | Admit: 2021-01-02 | Discharge: 2021-01-02 | Disposition: A | Discharge status: Home / Self Care | Payer: Medicare Other | Source: Ambulatory Visit | Attending: Obstetrics & Gynecology | Admitting: Obstetrics & Gynecology

## 2021-01-02 DIAGNOSIS — Z1231 Encounter for screening mammogram for malignant neoplasm of breast: Secondary | ICD-10-CM

## 2021-01-08 ENCOUNTER — Other Ambulatory Visit: Payer: Self-pay | Admitting: Obstetrics & Gynecology

## 2021-01-08 DIAGNOSIS — R928 Other abnormal and inconclusive findings on diagnostic imaging of breast: Secondary | ICD-10-CM

## 2021-01-08 IMAGING — MG DIGITAL SCREENING BILAT W/ TOMO W/ CAD
8 series · 9 of 24 positions shown · non-contrast
Comparison: Previous exam(s).

CLINICAL DATA: Screening.

EXAM:
DIGITAL SCREENING BILATERAL MAMMOGRAM WITH TOMO AND CAD

[L MLO synth-2D]
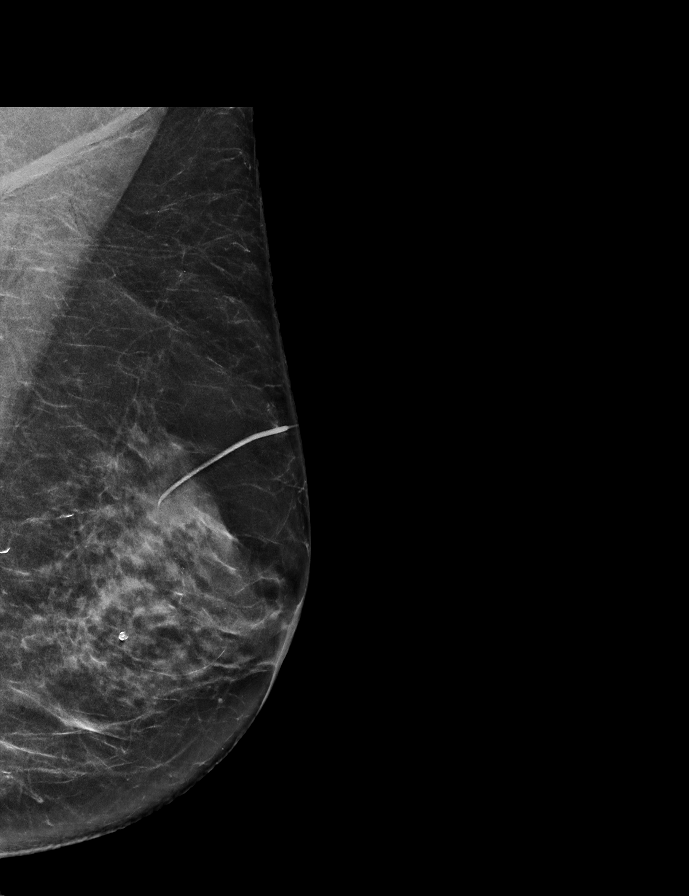

[R CC synth-2D]
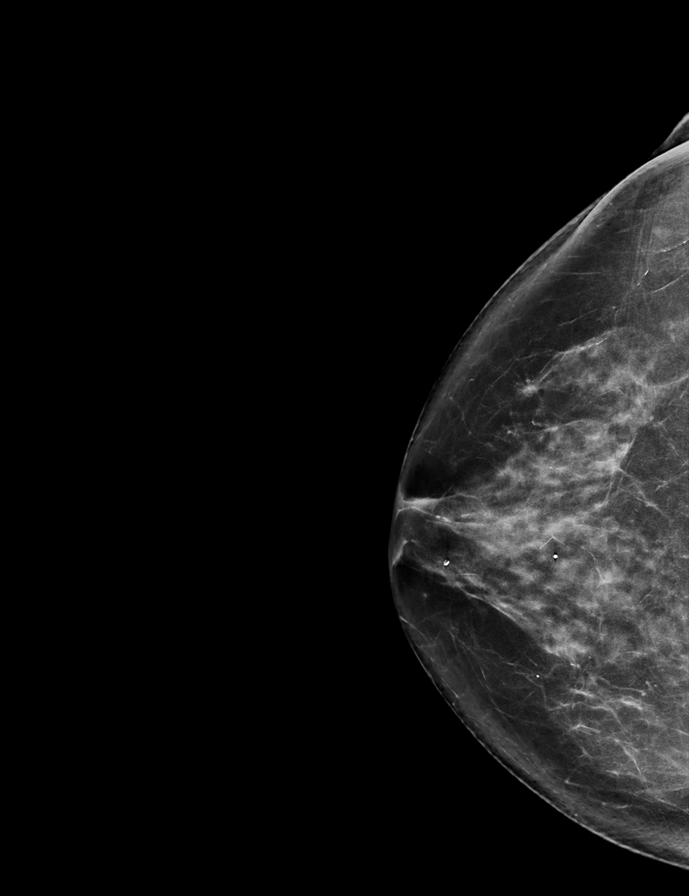

[R MLO synth-2D]
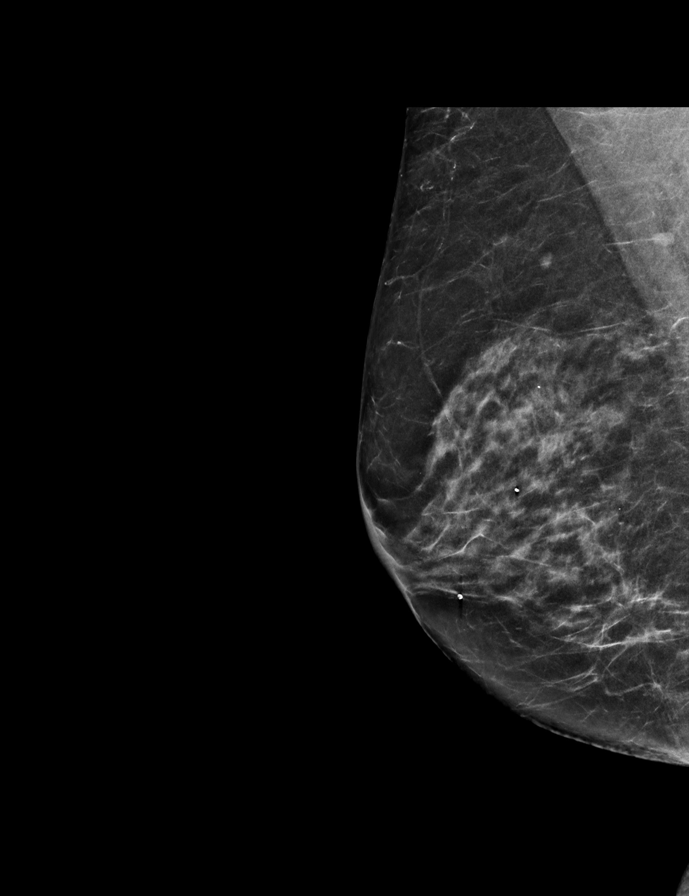

[L CC synth-2D]
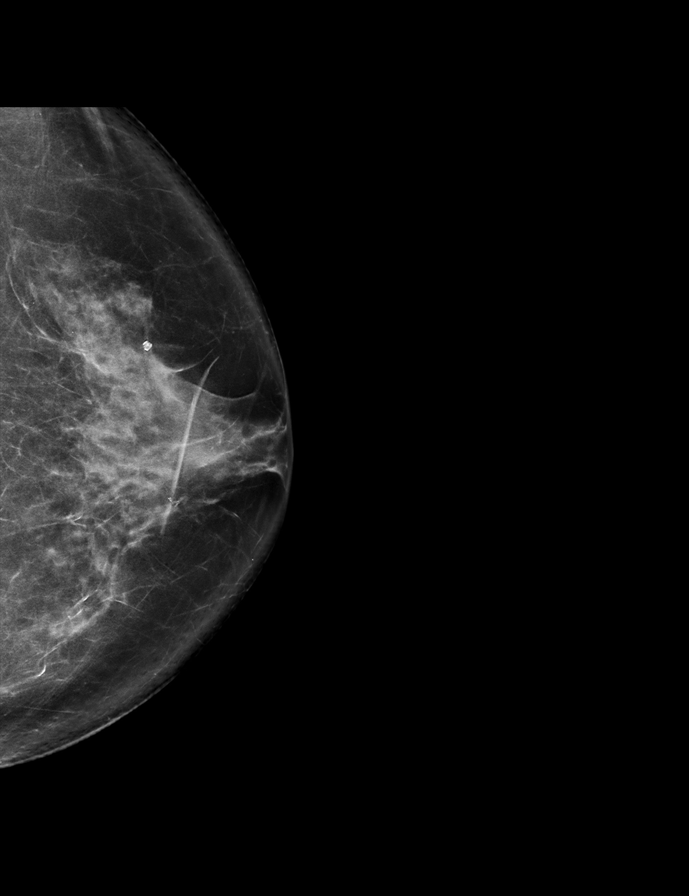

[L MLO tomo · 2 of 66 frames shown]
[frame 22/66]
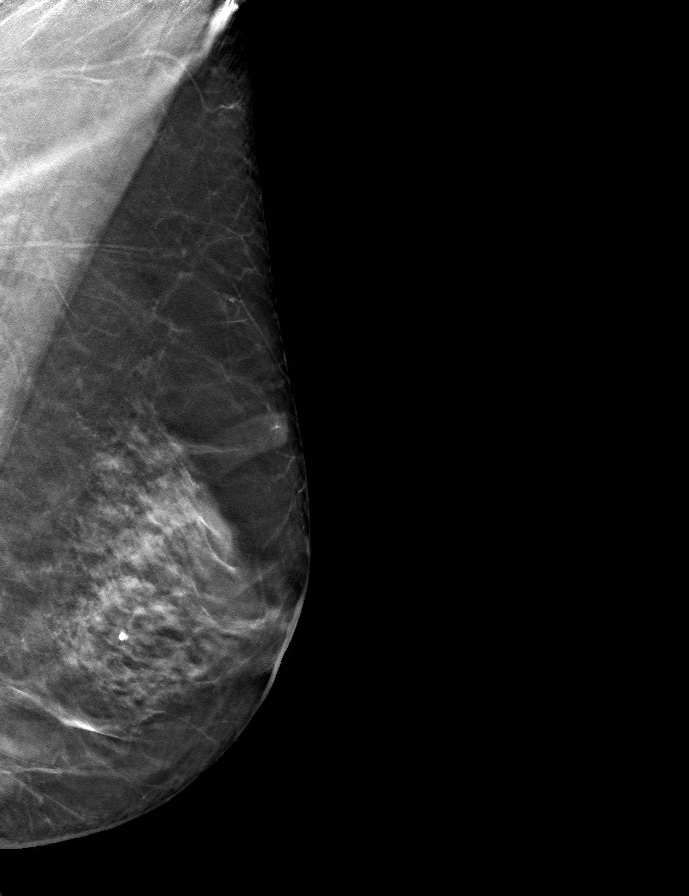
[frame 33/66]
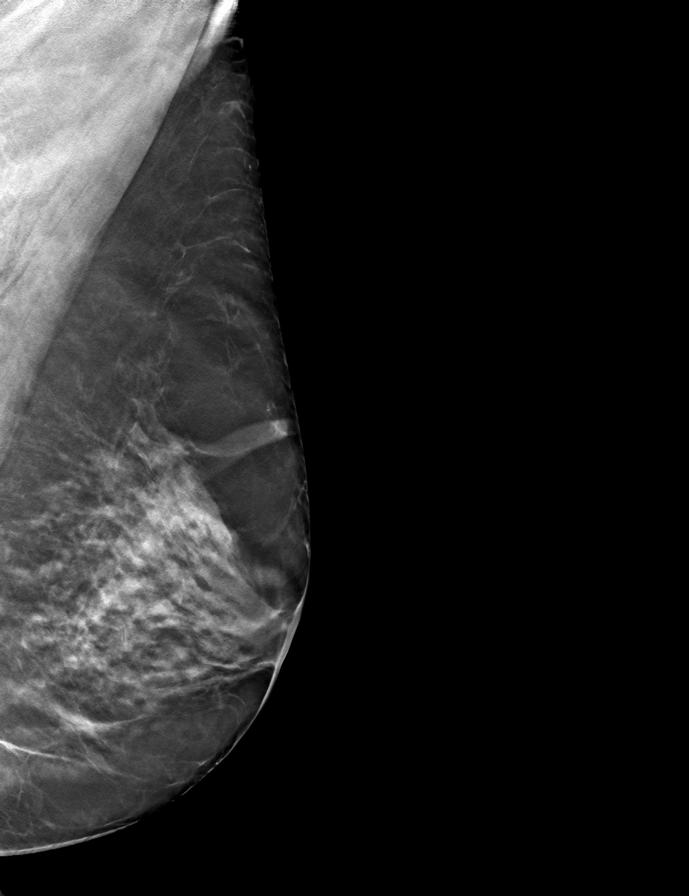

[R MLO tomo · tomo slice 32/63.0]
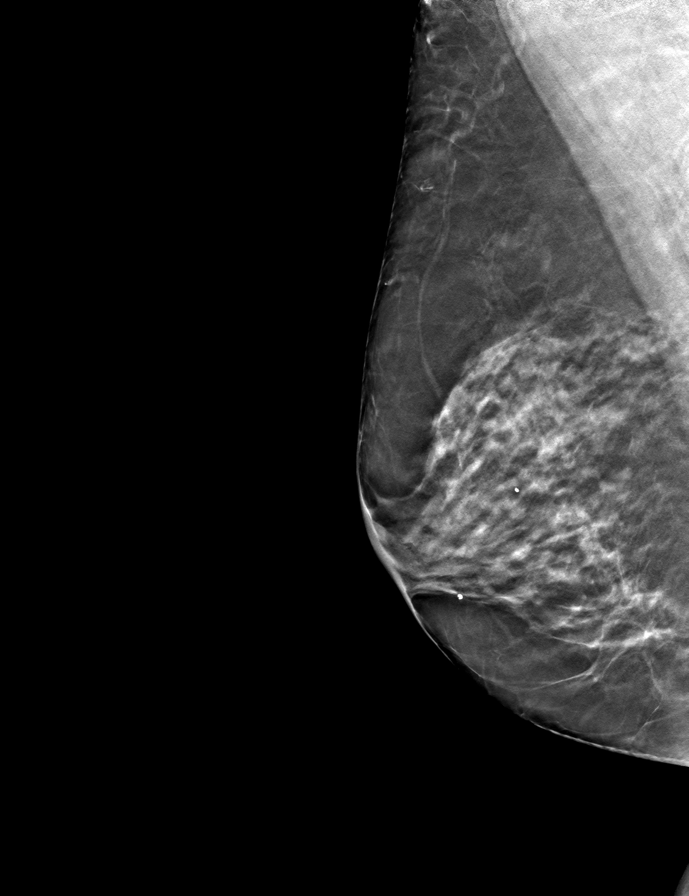

[L CC tomo · tomo slice 38/75.0]
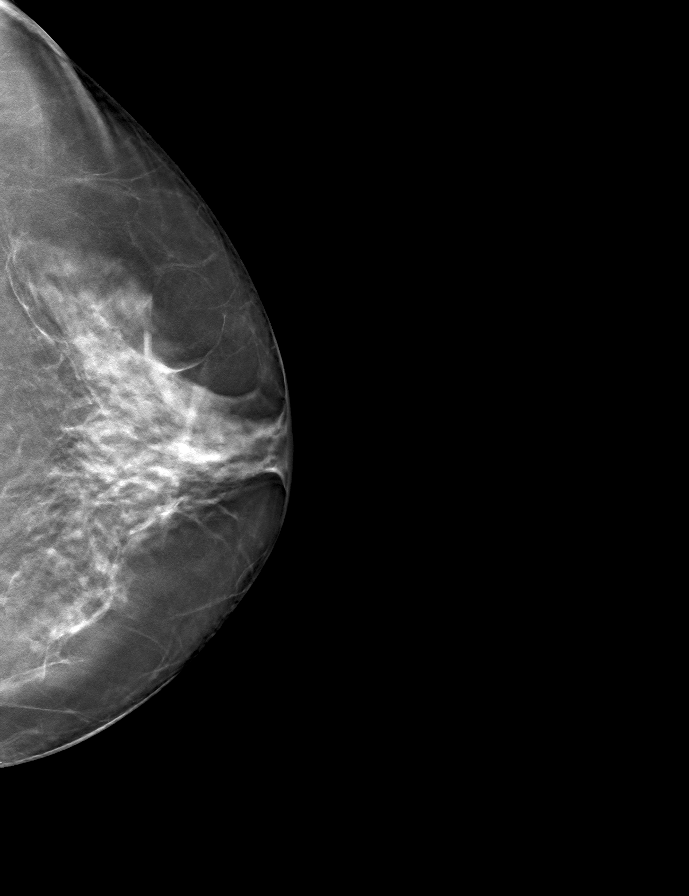

[R CC tomo · tomo slice 37/73.0]
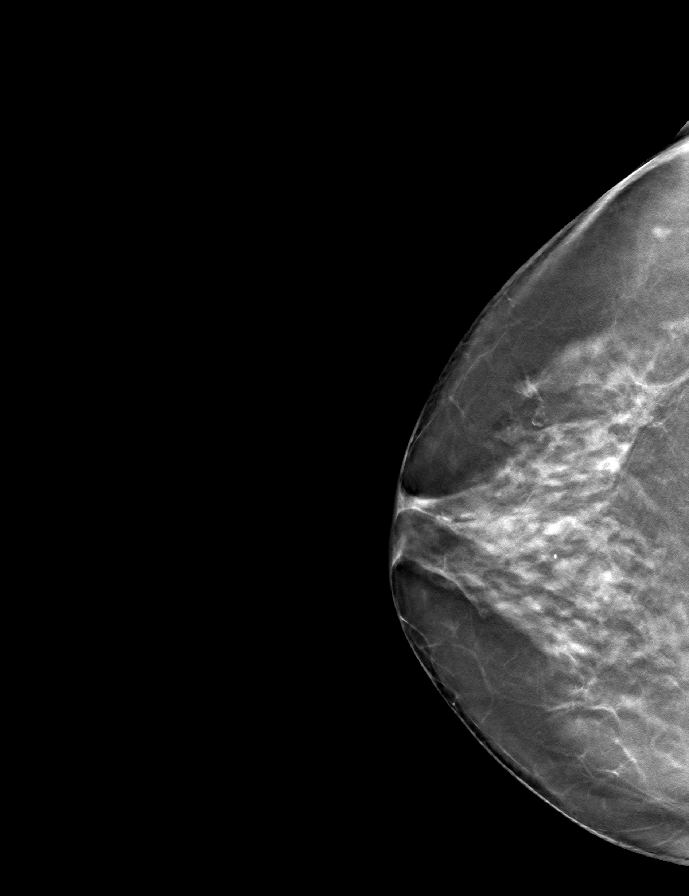

[9 of 24 positions shown; findings below may reference images not displayed]

ACR Breast Density Category c: The breast tissue is heterogeneously
dense, which may obscure small masses.
FINDINGS: There are no findings suspicious for malignancy. Images were
processed with CAD.
IMPRESSION: No mammographic evidence of malignancy. A result letter of this
screening mammogram will be mailed directly to the patient.

RECOMMENDATION:
Screening mammogram in one year. (Code:[id number])

BI-RADS CATEGORY  1: Negative.

## 2021-01-15 ENCOUNTER — Other Ambulatory Visit: Payer: Self-pay

## 2021-01-15 ENCOUNTER — Ambulatory Visit: Payer: Medicare Other

## 2021-01-15 ENCOUNTER — Ambulatory Visit
Admission: RE | Admit: 2021-01-15 | Discharge: 2021-01-15 | Disposition: A | Discharge status: Home / Self Care | Payer: Medicare Other | Source: Ambulatory Visit | Attending: Obstetrics & Gynecology | Admitting: Obstetrics & Gynecology

## 2021-01-15 DIAGNOSIS — R928 Other abnormal and inconclusive findings on diagnostic imaging of breast: Secondary | ICD-10-CM

## 2021-01-15 DIAGNOSIS — R922 Inconclusive mammogram: Secondary | ICD-10-CM | POA: Diagnosis not present

## 2021-01-16 ENCOUNTER — Encounter (HOSPITAL_BASED_OUTPATIENT_CLINIC_OR_DEPARTMENT_OTHER): Payer: Self-pay | Admitting: Obstetrics & Gynecology

## 2021-01-16 ENCOUNTER — Ambulatory Visit (INDEPENDENT_AMBULATORY_CARE_PROVIDER_SITE_OTHER): Payer: Medicare Other | Admitting: Obstetrics & Gynecology

## 2021-01-16 VITALS — BP 145/67 | HR 72 | Ht 65.0 in | Wt 154.4 lb

## 2021-01-16 DIAGNOSIS — Z9189 Other specified personal risk factors, not elsewhere classified: Secondary | ICD-10-CM

## 2021-01-16 DIAGNOSIS — B009 Herpesviral infection, unspecified: Secondary | ICD-10-CM

## 2021-01-16 DIAGNOSIS — N393 Stress incontinence (female) (male): Secondary | ICD-10-CM

## 2021-01-16 DIAGNOSIS — Z780 Asymptomatic menopausal state: Secondary | ICD-10-CM | POA: Diagnosis not present

## 2021-01-16 DIAGNOSIS — K5090 Crohn's disease, unspecified, without complications: Secondary | ICD-10-CM

## 2021-01-16 MED ORDER — NONFORMULARY OR COMPOUNDED ITEM: 3 refills | Status: DC

## 2021-01-16 NOTE — Addendum Note (Signed)
Addended by: Megan Salon on: 01/16/2021 10:09 AM   Modules accepted: Orders

## 2021-01-16 NOTE — Progress Notes (Addendum)
75 y.o. G2P2 Married White or Caucasian female here for breast and pelvic exam.  Doing well.  Denies vaginal bleeding.  Having issues with urinary incontinence.  She has leakage with lifting something, coughing, sneezing.  Does not have urgency.  Treatment options.  [place] to go for consult.  Not interested in PT.  Denies any other urinary symptoms.   Denies vaginal bleeding.  Saw Dr. Regis Bill in September.  Lab work reviewed. TSH was elevated.   Patient's last menstrual period was 04/19/1992.          Sexually active: Yes.    H/O STD:  Oral HSV  Health Maintenance: PCP:  DR. Regis Bill.  Last wellness appt was 12/2020.  Did blood work at that appt:  yes Vaccines are up to date:  yes Colonoscopy:  10/10/2018, follow up 5 years MMG:  01/15/2021 Diagnostic, follow up was normal BMD:  01/02/2020, normal Last pap smear:  07/13/2019 Negative.   H/o abnormal pap smear:  no    reports that she has never smoked. She has never used smokeless tobacco. She reports current alcohol use of about 1.0 - 2.0 standard drink per week. She reports that she does not use drugs.  Past Medical History:  Diagnosis Date   Allergy    Anxiety    not treated    Arthritis    knees, neck - no meds   Cataract    bilateral - MD just watchning   Colon polyps    Crohn's    Eczema    Esophageal reflux    Gastritis    Gout    Hiatal hernia    Hyperlipemia    Ileitis    Migraines    tx with naproxen - last one 4 yrs ago   Rosacea    Skin cancer of face    scca removed     Past Surgical History:  Procedure Laterality Date   BREAST BIOPSY Left 1980   COLONOSCOPY  08/2015   Pyrtle -polyp   DILATION AND CURETTAGE OF UTERUS     POLYPECTOMY     TONSILLECTOMY     TOTAL VAGINAL HYSTERECTOMY  1994   UPPER GASTROINTESTINAL ENDOSCOPY     UPPER GI ENDOSCOPY     hiatal hernia   WISDOM TOOTH EXTRACTION      Current Outpatient Medications  Medication Sig Dispense Refill   acetaminophen (TYLENOL) 500 MG tablet Take  500 mg by mouth every 6 (six) hours as needed.     Azelaic Acid (FINACEA) 15 % cream After skin is thoroughly washed and patted dry, gently but thoroughly massage a thin film of azelaic acid cream into the affected area twice daily, in the morning and evening. 50 g 11   citalopram (CELEXA) 20 MG tablet TAKE 1 TABLET(20 MG) BY MOUTH DAILY 90 tablet 3   ipratropium (ATROVENT) 0.06 % nasal spray instill 2 sprays into each nostril twice a day to three times a day 45 mL prn   MELATONIN PO Take 10 mg by mouth daily.     mesalamine (PENTASA) 500 MG CR capsule TAKE 3 CAPSULES BY MOUTH TWICE DAILY 540 capsule 3   Misc. Devices (VAGINAL SUPPOSITORY APPLICATOR) MISC by Does not apply route. Vitamin E As needed     MULTIPLE VITAMIN PO Take 1 tablet by mouth daily.     NON FORMULARY as needed. CBD Cream- applies prn to muscles, joints     omeprazole (PRILOSEC) 40 MG capsule TAKE 1 CAPSULE(40 MG) BY MOUTH DAILY  90 capsule 3   simvastatin (ZOCOR) 40 MG tablet Take 1 tablet (40 mg total) by mouth daily. 90 tablet 3   valACYclovir (VALTREX) 1000 MG tablet Take 2 tablets (2,000 mg total) by mouth 2 (two) times daily as needed. 90 tablet 3   No current facility-administered medications for this visit.    Family History  Problem Relation Age of Onset   Parkinsonism Mother    Lung cancer Mother    Heart disease Father        died chf in 14 s   Hypertension Father    Congestive Heart Failure Father    Colon polyps Sister    Breast cancer Daughter    Breast cancer Paternal Aunt    Breast cancer Cousin    Colon cancer Neg Hx    Esophageal cancer Neg Hx    Rectal cancer Neg Hx    Stomach cancer Neg Hx     Review of Systems  All other systems reviewed and are negative.  Exam:   BP (!) 145/67 (BP Location: Right Arm, Patient Position: Sitting, Cuff Size: Normal)   Pulse 72   Ht 5' 5"  (1.651 m)   Wt 154 lb 6.4 oz (70 kg)   LMP 04/19/1992   BMI 25.69 kg/m   Height: 5' 5"  (165.1 cm)  General  appearance: alert, cooperative and appears stated age Breasts: normal appearance, no masses or tenderness Abdomen: soft, non-tender; bowel sounds normal; no masses,  no organomegaly Lymph nodes: Cervical, supraclavicular, and axillary nodes normal.  No abnormal inguinal nodes palpated Neurologic: Grossly normal  Pelvic: External genitalia:  no lesions              Urethra:  normal appearing urethra with no masses, tenderness or lesions              Bartholins and Skenes: normal                 Vagina: normal appearing vagina with atrophic changes and no discharge, no lesions              Cervix: surgically absent              Pap taken: No. Bimanual Exam:  Uterus:  surgically absent              Adnexa: normal adnexa and no mass, fullness, tenderness               Rectovaginal: Confirms               Anus:  normal sphincter tone, no lesions  Chaperone, Octaviano Batty, CMA, was present for exam.  Assessment/Plan: 1. GYN exam for high-risk Medicare patient - pap was normal 2021 - MMG up to date - colonoscopy due next year - BMD normal - lab work done with Dr. 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 Stress incontinence - Ambulatory referral to Urogynecology  4. Crohn's disease without complication, unspecified gastrointestinal tract location [facility]) - followed by Dr. Hilarie Fredrickson  5. Postmenopausal - no HRT  6.  Vaginal atrophy - rx for Vit E vaginal suppositories will be faxed to [place]

## 2021-02-03 ENCOUNTER — Other Ambulatory Visit: Payer: Self-pay

## 2021-02-03 ENCOUNTER — Other Ambulatory Visit (INDEPENDENT_AMBULATORY_CARE_PROVIDER_SITE_OTHER): Payer: Medicare Other

## 2021-02-03 DIAGNOSIS — R946 Abnormal results of thyroid function studies: Secondary | ICD-10-CM | POA: Diagnosis not present

## 2021-02-03 LAB — TSH: TSH: 3.27 u[IU]/mL (ref 0.35–5.50)

## 2021-02-03 LAB — T4, FREE: Free T4: 0.63 ng/dL (ref 0.60–1.60)

## 2021-02-06 NOTE — Progress Notes (Signed)
Thyroid function now normal  no further action needed   but should check yearly

## 2021-06-25 ENCOUNTER — Other Ambulatory Visit: Payer: Self-pay | Admitting: Internal Medicine

## 2021-06-25 DIAGNOSIS — K219 Gastro-esophageal reflux disease without esophagitis: Secondary | ICD-10-CM

## 2021-06-26 ENCOUNTER — Encounter: Payer: Self-pay | Admitting: *Deleted

## 2021-07-02 ENCOUNTER — Ambulatory Visit: Payer: Medicare Other | Admitting: Internal Medicine

## 2021-07-02 ENCOUNTER — Encounter: Payer: Self-pay | Admitting: Internal Medicine

## 2021-07-02 VITALS — BP 120/64 | HR 72 | Ht 65.5 in | Wt 146.1 lb

## 2021-07-02 DIAGNOSIS — Z86010 Personal history of colonic polyps: Secondary | ICD-10-CM

## 2021-07-02 DIAGNOSIS — K219 Gastro-esophageal reflux disease without esophagitis: Secondary | ICD-10-CM

## 2021-07-02 DIAGNOSIS — K5000 Crohn's disease of small intestine without complications: Secondary | ICD-10-CM | POA: Diagnosis not present

## 2021-07-02 DIAGNOSIS — R0989 Other specified symptoms and signs involving the circulatory and respiratory systems: Secondary | ICD-10-CM | POA: Diagnosis not present

## 2021-07-02 MED ORDER — OMEPRAZOLE 40 MG PO CPDR: 40.0000 mg | DELAYED_RELEASE_CAPSULE | Freq: Every day | ORAL | 3 refills | Status: DC

## 2021-07-02 MED ORDER — MESALAMINE ER 500 MG PO CPCR: ORAL_CAPSULE | ORAL | 3 refills | Status: DC

## 2021-07-02 NOTE — Progress Notes (Signed)
? ?  Subjective:  ? ? Patient ID: Bridget Reyes, female    DOB: 05/31/1945, 76 y.o.   MRN: [id number] ? ?HPI ?Jerita Wimbush is a 76 year old female with a history of ileal Crohn's disease, GERD with hiatal hernia and LPR, history of migraines, hyperlipidemia who is here for follow-up.  She was seen 1 year ago and is here alone today. ? ?She reports from a Crohn's perspective she is doing very well.  She continues Pentasa 1.5 g twice daily.  Her insurance company notified her recently that there is a genetic equivalent and the brand-name will not be covered this year.  She has not noted any recurrent right sided or lower abdominal pain.  Bowel movements have been regular without blood or melena. ? ?She is also continued omeprazole 40 mg daily.  She is not having heartburn, dysphagia or odynophagia.  There is no throat clearing.  She has for the last multiple months had a throat "tickling or itching" sensation that can lead to coughing.  She does have postnasal drip as well as rhinorrhea for which she takes an Atrovent spray.  She is also been using Claritin but has not found it helpful.  She wonders if this is a reflux phenomenon. ? ? ?Review of Systems ?As per HPI, otherwise negative ? ?Current Medications, Allergies, Past Medical History, Past Surgical History, Family History and Social History were reviewed in [employer] record. ?   ?Objective:  ? Physical Exam ?BP 120/64 (BP Location: Left Arm, Patient Position: Sitting, Cuff Size: Normal)   Pulse 72   Ht 5' 5.5" (1.664 m)   Wt 146 lb 2 oz (66.3 kg)   LMP 04/19/1992   BMI 23.95 kg/m?  ?Gen: awake, alert, NAD ?HEENT: anicteric ?CV: RRR, no mrg ?Pulm: CTA b/l ?Abd: soft, NT/ND, +BS throughout ?Ext: no c/c/e ?Neuro: nonfocal ? ? ?   ?Assessment & Plan:  ?76 year old female with a history of ileal Crohn's disease, GERD with hiatal hernia and LPR, history of migraines, hyperlipidemia who is here for follow-up.  ? ?Ileal Crohn's --mild  but recurrent symptoms when Pentasa will dose was lowered.  I do feel she is benefiting from Pentasa and we should continue. ?--Continue Pentasa 1.5 g daily; okay for generic equivalent though I asked her to let me know if she has recurrent abdominal pain when changed to generic formula ? ?2.  GERD with LPR --she is not having heartburn or pyrosis symptoms.  Her throat clearing has also improved though she has this tickling or itching throat symptom.  I am not convinced that this is reflux related.  It may relate more to postnasal drip.  We discussed this today.  I recommended the following: ?--Trial of changing Claritin to Zyrtec for 2 to 3 weeks ?--If symptoms does not improve with Zyrtec she can stop this and try to increase omeprazole to 40 mg twice daily AC for 3 weeks ?--If no improvement in this pharyngeal symptom with increased PPI then return to once daily PPI and seek ENT consultation ? ?3.  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 ?Annual follow-up, sooner if necessary ? ? ? ?

## 2021-07-02 NOTE — Patient Instructions (Signed)
Try Zyrtec 48m once daily for 7 days to see if that will help with throat issues. If that does not help,then increase your Omeprazole to twice daily.  ? ?Let Dr PHilarie Fredricksonknow your doing in about 2 weeks.  ? ?Continue Omeprazole once daily for now, and continue Pentasa.  ? ?If you are age 5560or older, your body mass index should be between 23-30. Your Body mass index is 23.95 kg/m?.Marland KitchenIf this is out of the aforementioned range listed, please consider follow up with your Primary Care Provider. ? ?________________________________________________________ ? ?The [facility] providers would like to encourage you to use [facility] communicate with providers for non-urgent requests or questions.  Due to long hold times on the telephone, sending your provider a message by [facility] be a faster and more efficient way to get a response.  Please allow 48 business hours for a response.  Please remember that this is for non-urgent requests.  ?_______________________________________________________ ? ?Thank you for choosing me and [facility]. ? ? ?  ?

## 2021-08-06 DIAGNOSIS — H353131 Nonexudative age-related macular degeneration, bilateral, early dry stage: Secondary | ICD-10-CM | POA: Diagnosis not present

## 2021-08-06 DIAGNOSIS — H524 Presbyopia: Secondary | ICD-10-CM | POA: Diagnosis not present

## 2021-08-06 DIAGNOSIS — H52223 Regular astigmatism, bilateral: Secondary | ICD-10-CM | POA: Diagnosis not present

## 2021-09-01 DIAGNOSIS — D692 Other nonthrombocytopenic purpura: Secondary | ICD-10-CM | POA: Diagnosis not present

## 2021-09-01 DIAGNOSIS — L821 Other seborrheic keratosis: Secondary | ICD-10-CM | POA: Diagnosis not present

## 2021-09-01 DIAGNOSIS — D2271 Melanocytic nevi of right lower limb, including hip: Secondary | ICD-10-CM | POA: Diagnosis not present

## 2021-09-01 DIAGNOSIS — L814 Other melanin hyperpigmentation: Secondary | ICD-10-CM | POA: Diagnosis not present

## 2021-09-01 DIAGNOSIS — D235 Other benign neoplasm of skin of trunk: Secondary | ICD-10-CM | POA: Diagnosis not present

## 2021-10-30 DIAGNOSIS — H353131 Nonexudative age-related macular degeneration, bilateral, early dry stage: Secondary | ICD-10-CM | POA: Diagnosis not present

## 2021-10-30 DIAGNOSIS — H2513 Age-related nuclear cataract, bilateral: Secondary | ICD-10-CM | POA: Diagnosis not present

## 2021-10-30 DIAGNOSIS — H2511 Age-related nuclear cataract, right eye: Secondary | ICD-10-CM | POA: Diagnosis not present

## 2021-10-30 DIAGNOSIS — H18413 Arcus senilis, bilateral: Secondary | ICD-10-CM | POA: Diagnosis not present

## 2021-10-30 DIAGNOSIS — H25043 Posterior subcapsular polar age-related cataract, bilateral: Secondary | ICD-10-CM | POA: Diagnosis not present

## 2021-11-05 DIAGNOSIS — H2513 Age-related nuclear cataract, bilateral: Secondary | ICD-10-CM | POA: Diagnosis not present

## 2021-12-02 ENCOUNTER — Other Ambulatory Visit: Payer: Self-pay | Admitting: Obstetrics & Gynecology

## 2021-12-02 DIAGNOSIS — Z1231 Encounter for screening mammogram for malignant neoplasm of breast: Secondary | ICD-10-CM

## 2021-12-14 ENCOUNTER — Ambulatory Visit (INDEPENDENT_AMBULATORY_CARE_PROVIDER_SITE_OTHER): Payer: Medicare Other

## 2021-12-14 VITALS — Ht 65.5 in | Wt 142.0 lb

## 2021-12-14 DIAGNOSIS — Z0000 Encounter for general adult medical examination without abnormal findings: Secondary | ICD-10-CM

## 2021-12-14 NOTE — Progress Notes (Signed)
Subjective:   Bridget Reyes is a 76 y.o. female who presents for Medicare Annual (Subsequent) preventive examination.  Review of Systems    Virtual Visit via Telephone Note  I connected with  Bridget Reyes on 12/14/21 at 11:30 AM EDT by telephone and verified that I am speaking with the correct person using two identifiers.  Location: Patient: Home Provider: Office Persons participating in the virtual visit: patient/Nurse Health Advisor   I discussed the limitations, risks, security and privacy concerns of performing an evaluation and management service by telephone and the availability of in person appointments. The patient expressed understanding and agreed to proceed.  Interactive audio and video telecommunications were attempted between this nurse and patient, however failed, due to patient having technical difficulties OR patient did not have access to video capability.  We continued and completed visit with audio only.  Some vital signs may be absent or patient reported.   Bridget Peaches, LPN  Cardiac Risk Factors include: advanced age (>66mn, >>33women)     Objective:    Today's Vitals   12/14/21 1137  Weight: 142 lb (64.4 kg)  Height: 5' 5.5" (1.664 m)   Body mass index is 23.27 kg/m.     12/14/2021   11:49 AM 10/24/2015    1:50 PM  Advanced Directives  Does Patient Have a Medical Advance Directive? Yes Yes  Type of AParamedicof [place]im[place]Living will [place] Does patient want to make changes to medical advance directive? No - Patient declined   Copy of HHeflinin Chart? No - copy requested No - copy requested    Current Medications (verified) Outpatient Encounter Medications as of 12/14/2021  Medication Sig   acetaminophen (TYLENOL) 500 MG tablet Take 500 mg by mouth every 6 (six) hours as needed.   Azelaic Acid (FINACEA) 15 % cream After skin is thoroughly washed and patted dry,  gently but thoroughly massage a thin film of azelaic acid cream into the affected area twice daily, in the morning and evening.   citalopram (CELEXA) 20 MG tablet TAKE 1 TABLET(20 MG) BY MOUTH DAILY   ipratropium (ATROVENT) 0.06 % nasal spray instill 2 sprays into each nostril twice a day to three times a day   mesalamine (PENTASA) 500 MG CR capsule TAKE 3 CAPSULES BY MOUTH TWICE DAILY   Misc. Devices (VAGINAL SUPPOSITORY APPLICATOR) MISC by Does not apply route. Vitamin E As needed   MULTIPLE VITAMIN PO Take 1 tablet by mouth daily.   NON FORMULARY as needed. CBD Cream- applies prn to muscles, joints   NONFORMULARY OR COMPOUNDED ITEM Vitamin E vaginal suppositories 200u/ml.  One pv three times weekly.   omeprazole (PRILOSEC) 40 MG capsule Take 1 capsule (40 mg total) by mouth daily.   simvastatin (ZOCOR) 40 MG tablet Take 1 tablet (40 mg total) by mouth daily.   valACYclovir (VALTREX) 1000 MG tablet Take 2 tablets (2,000 mg total) by mouth 2 (two) times daily as needed.   No facility-administered encounter medications on file as of 12/14/2021.    Allergies (verified) Sulfamethoxazole-trimethoprim, Amoxicillin, Bactrim [sulfamethoxazole-trimethoprim], Penicillins, Sertraline, Tetracaine, Topamax [topiramate], and Remeron [mirtazapine]   History: Past Medical History:  Diagnosis Date   Allergy    Anxiety    not treated    Arthritis    knees, neck - no meds   Cataract    bilateral - MD just watchning   Colon polyps    Crohn's  Diverticulosis    Eczema    Esophageal reflux    Gastritis    Gout    Hiatal hernia    Hyperlipemia    Ileitis    Migraines    tx with naproxen - last one 4 yrs ago   Rosacea    Skin cancer of face    scca removed    Past Surgical History:  Procedure Laterality Date   BREAST BIOPSY Left 1980   COLONOSCOPY  08/2015   Pyrtle -polyp   DILATION AND CURETTAGE OF UTERUS     POLYPECTOMY     TONSILLECTOMY     TOTAL VAGINAL HYSTERECTOMY  1994   UPPER  GASTROINTESTINAL ENDOSCOPY     UPPER GI ENDOSCOPY     hiatal hernia   WISDOM TOOTH EXTRACTION     Family History  Problem Relation Age of Onset   Parkinsonism Mother    Lung cancer Mother    Heart disease Father        died chf in 68 s   Hypertension Father    Congestive Heart Failure Father    Colon polyps Sister    Breast cancer Daughter    Breast cancer Paternal Aunt    Breast cancer Cousin    Colon cancer Neg Hx    Esophageal cancer Neg Hx    Rectal cancer Neg Hx    Stomach cancer Neg Hx    Social History   Socioeconomic History   Marital status: Married    Spouse name: Not on file   Number of children: 2   Years of education: Not on file   Highest education level: Not on file  Occupational History   Occupation: retired     Comment: school Product manager: UNEMPLOYED  Tobacco Use   Smoking status: Never   Smokeless tobacco: Never  Vaping Use   Vaping Use: Never used  Substance and Sexual Activity   Alcohol use: Yes    Alcohol/week: 1.0 - 2.0 standard drink of alcohol    Types: 1 - 2 Glasses of wine per week   Drug use: No   Sexual activity: Yes    Partners: Male    Birth control/protection: Surgical    Comment: hysterectomy  Other Topics Concern   Not on file  Social History Narrative   hh  Of 2       Dog    Married    G2P2   Social Determinants of Health   Financial Resource Strain: Low Risk  (12/14/2021)   Overall Financial Resource Strain (CARDIA)    Difficulty of Paying Living Expenses: Not hard at all  Food Insecurity: No Food Insecurity (12/14/2021)   Hunger Vital Sign    Worried About Running Out of Food in the Last Year: Never true    Ran Out of Food in the Last Year: Never true  Transportation Needs: No Transportation Needs (12/14/2021)   PRAPARE - Hydrologist (Medical): No    Lack of Transportation (Non-Medical): No  Physical Activity: Sufficiently Active (12/14/2021)   Exercise Vital Sign    Days of  Exercise per Week: 5 days    Minutes of Exercise per Session: 60 min  Stress: Stress Concern Present (12/14/2021)   [place]    Feeling of Stress : To some extent  Social Connections: Socially Integrated (12/14/2021)   Social Connection and Isolation Panel [NHANES]    Frequency of Communication  with Friends and Family: Patient refused    Frequency of Social Gatherings with Friends and Family: More than three times a week    Attends Religious Services: More than 4 times per year    Active Member of [employer] or Organizations: Yes    Attends Music therapist: More than 4 times per year    Marital Status: Married    Tobacco Counseling Counseling given: Not Answered   Clinical Intake:  Pre-visit preparation completed: Yes  Pain : No/denies pain     BMI - recorded: 23.27 Nutritional Status: BMI of 19-24  Normal Nutritional Risks: None Diabetes: No  How often do you need to have someone help you when you read instructions, pamphlets, or other written materials from your doctor or pharmacy?: 1 - Never  Diabetic?  No  Interpreter Needed?: No  Information entered by :: Rolene Arbour LPN   Activities of Daily Living    12/14/2021   11:44 AM 12/09/2021   12:02 PM  In your present state of health, do you have any difficulty performing the following activities:  Hearing? 1 1  Comment Wears rt ear hearing aid   Vision? 0 0  Difficulty concentrating or making decisions? 0 0  Walking or climbing stairs? 0 0  Dressing or bathing? 0 0  Doing errands, shopping? 0 0  Preparing Food and eating ? N N  Using the Toilet? N N  In the past six months, have you accidently leaked urine? Tempie Donning  Comment Wears breif. Followed by Dr Ammie Ferrier   Do you have problems with loss of bowel control? N N  Managing your Medications? N N  Managing your Finances? N N  Housekeeping or managing your Housekeeping? N N    Patient  Care Team: Panosh, Standley Brooking, MD as PCP - General Pedro Earls, MD (Family Medicine) Martinique, Amy, MD (Dermatology) Megan Salon, MD (Gynecology) Orie Rout, MD as Referring Physician (Specialist) Marygrace Drought, MD (Ophthalmology) Jerrell Belfast, MD as Consulting Physician (Otolaryngology) Marica Otter, Garland (Optometry) Pyrtle, Lajuan Lines, MD as Consulting Physician (Gastroenterology)  Indicate any recent Medical Services you may have received from other than [facility] providers in the past year (date may be approximate).     Assessment:   This is a routine wellness examination for [place].  Hearing/Vision screen Hearing Screening - Comments:: Wears rt ear hearing aid.  Vision Screening - Comments:: Wears classes. Followed by Dr Sabra Heck  Dietary issues and exercise activities discussed: Current Exercise Habits: Home exercise routine, Type of exercise: walking, Time (Minutes): 60, Frequency (Times/Week): 5, Weekly Exercise (Minutes/Week): 300, Intensity: Moderate, Exercise limited by: None identified   Goals Addressed               This Visit's Progress     Lose weight (pt-stated)        Go to weight watchers to lose weight.       Depression Screen    12/14/2021   11:42 AM 01/16/2021    9:26 AM 12/23/2020    9:09 AM 12/21/2019    8:22 AM 12/19/2018    8:28 AM 12/13/2017    8:21 AM 12/08/2016   10:01 AM  PHQ 2/9 Scores  PHQ - 2 Score 0 0 0 0 0 0 0  PHQ- 9 Score   3 0       Fall Risk    12/14/2021   11:48 AM 12/09/2021   12:02 PM 12/23/2020    8:29 AM 12/21/2019  8:22 AM 12/19/2018    8:28 AM  Fall Risk   Falls in the past year? 1 1 0 0 0  Number falls in past yr: 0 0 0  0  Injury with Fall? 0 0 0  0  Risk for fall due to : No Fall Risks      Follow up     Falls evaluation completed    FALL RISK PREVENTION PERTAINING TO THE HOME:  Any stairs in or around the home? Yes  If so, are there any without handrails? No  Home free of loose throw rugs in walkways, pet beds,  electrical cords, etc? Yes  Adequate lighting in your home to reduce risk of falls? Yes   ASSISTIVE DEVICES UTILIZED TO PREVENT FALLS:  Life alert? No  Use of a cane, walker or w/c? No  Grab bars in the bathroom? Yes  Shower chair or bench in shower? Yes  Elevated toilet seat or a handicapped toilet? No   TIMED UP AND GO:  Was the test performed? No . Audio Visit   Cognitive Function:        12/14/2021   11:49 AM  6CIT Screen  What Year? 0 points  What month? 0 points  What time? 0 points  Count back from 20 0 points  Months in reverse 0 points  Repeat phrase 0 points  Total Score 0 points    Immunizations Immunization History  Administered Date(s) Administered   Influenza, High Dose Seasonal PF 02/03/2015, 01/18/2017, 01/04/2018, 01/12/2019   Influenza,inj,Quad PF,6+ Mos 01/25/2014   Influenza-Unspecified 01/18/2017, 01/18/2020, 01/19/2021   PFIZER(Purple Top)SARS-COV-2 Vaccination 05/11/2019, 06/01/2019, 12/17/2019, 10/26/2020   Pneumococcal Conjugate-13 11/13/2013   Pneumococcal Polysaccharide-23 04/19/1998, 11/01/2011   Td 10/18/2003, 11/13/2013   Zoster Recombinat (Shingrix) 05/24/2017, 09/17/2017   Zoster, Live 10/29/2009    TDAP status: Up to date  Flu Vaccine status: Up to date  Pneumococcal vaccine status: Up to date  Covid-19 vaccine status: Completed vaccines  Qualifies for Shingles Vaccine? Yes   Zostavax completed Yes   Shingrix Completed?: Yes  Screening Tests Health Maintenance  Topic Date Due   INFLUENZA VACCINE  11/17/2021   COVID-19 Vaccine (5 - Pfizer risk series) 12/30/2021 (Originally 12/21/2020)   COLONOSCOPY (Pts 45-51yr Insurance coverage will need to be confirmed)  10/10/2023   TETANUS/TDAP  11/14/2023   Pneumonia Vaccine 76 Years old  Completed   DEXA SCAN  Completed   Hepatitis C Screening  Completed   Zoster Vaccines- Shingrix  Completed   HPV VACCINES  Aged Out    Health Maintenance  Health Maintenance Due  Topic  Date Due   INFLUENZA VACCINE  11/17/2021    Colorectal cancer screening: Type of screening: Colonoscopy. Completed 10/10/18. Repeat every 5 years  Mammogram status: No longer required due to  .  Bone Density status: Completed 01/02/20. Results reflect: Bone density results: OSTEOPOROSIS. Repeat every   years.  Lung Cancer Screening: (Low Dose CT Chest recommended if Age 77-80years, 30 pack-year currently smoking OR have quit w/in 15years.) does not qualify.     Additional Screening:  Hepatitis C Screening: does qualify; Completed 07/07/15  Vision Screening: Recommended annual ophthalmology exams for early detection of glaucoma and other disorders of the eye. Is the patient up to date with their annual eye exam?  Yes  Who is the provider or what is the name of the office in which the patient attends annual eye exams? Dr MSabra HeckIf pt is not established with a provider,  would they like to be referred to a provider to establish care? No .   Dental Screening: Recommended annual dental exams for proper oral hygiene  Community Resource Referral / Chronic Care Management:  CRR required this visit?  No   CCM required this visit?  No      Plan:     I have personally reviewed and noted the following in the patient's chart:   Medical and social history Use of alcohol, tobacco or illicit drugs  Current medications and supplements including opioid prescriptions. Patient is not currently taking opioid prescriptions. Functional ability and status Nutritional status Physical activity Advanced directives List of other physicians Hospitalizations, surgeries, and ER visits in previous 12 months Vitals Screenings to include cognitive, depression, and falls Referrals and appointments  In addition, I have reviewed and discussed with patient certain preventive protocols, quality metrics, and best practice recommendations. A written personalized care plan for preventive services as well as  general preventive health recommendations were provided to patient.     Bridget Peaches, LPN   07/30/8206   Nurse Notes: None

## 2021-12-14 NOTE — Patient Instructions (Addendum)
Bridget Reyes , Thank you for taking time to come for your Medicare Wellness Visit. I appreciate your ongoing commitment to your health goals. Please review the following plan we discussed and let me know if I can assist you in the future.   Screening recommendations/referrals: Colonoscopy: Done 10/10/18 Repeat 5 yrs Mammogram: No longer required Bone Density: Done 01/02/20 Recommended yearly ophthalmology/optometry visit for glaucoma screening and checkup Recommended yearly dental visit for hygiene and checkup  Vaccinations: Influenza vaccine: Up to date Pneumococcal vaccine: Up to date Tdap vaccine: Up to date Shingles vaccine: Done   Covid-19:Up to date  Advanced directives: Yes  Conditions/risks identified: None  Next appointment: Follow up in one year for your annual wellness visit    Preventive Care 50 Years and Older, Female Preventive care refers to lifestyle choices and visits with your health care provider that can promote health and wellness. What does preventive care include? A yearly physical exam. This is also called an annual well check. Dental exams once or twice a year. Routine eye exams. Ask your health care provider how often you should have your eyes checked. Personal lifestyle choices, including: Daily care of your teeth and gums. Regular physical activity. Eating a healthy diet. Avoiding tobacco and drug use. Limiting alcohol use. Practicing safe sex. Taking low-dose aspirin every day. Taking vitamin and mineral supplements as recommended by your health care provider. What happens during an annual well check? The services and screenings done by your health care provider during your annual well check will depend on your age, overall health, lifestyle risk factors, and family history of disease. Counseling  Your health care provider may ask you questions about your: Alcohol use. Tobacco use. Drug use. Emotional well-being. Home and relationship  well-being. Sexual activity. Eating habits. History of falls. Memory and ability to understand (cognition). Work and work Statistician. Reproductive health. Screening  You may have the following tests or measurements: Height, weight, and BMI. Blood pressure. Lipid and cholesterol levels. These may be checked every 5 years, or more frequently if you are over 46 years old. Skin check. Lung cancer screening. You may have this screening every year starting at age 35 if you have a 30-pack-year history of smoking and currently smoke or have quit within the past 15 years. Fecal occult blood test (FOBT) of the stool. You may have this test every year starting at age 68. Flexible sigmoidoscopy or colonoscopy. You may have a sigmoidoscopy every 5 years or a colonoscopy every 10 years starting at age 43. Hepatitis C blood test. Hepatitis B blood test. Sexually transmitted disease (STD) testing. Diabetes screening. This is done by checking your blood sugar (glucose) after you have not eaten for a while (fasting). You may have this done every 1-3 years. Bone density scan. This is done to screen for osteoporosis. You may have this done starting at age 71. Mammogram. This may be done every 1-2 years. Talk to your health care provider about how often you should have regular mammograms. Talk with your health care provider about your test results, treatment options, and if necessary, the need for more tests. Vaccines  Your health care provider may recommend certain vaccines, such as: Influenza vaccine. This is recommended every year. Tetanus, diphtheria, and acellular pertussis (Tdap, Td) vaccine. You may need a Td booster every 10 years. Zoster vaccine. You may need this after age 64. Pneumococcal 13-valent conjugate (PCV13) vaccine. One dose is recommended after age 62. Pneumococcal polysaccharide (PPSV23) vaccine. One dose is recommended  after age 70. Talk to your health care provider about which  screenings and vaccines you need and how often you need them. This information is not intended to replace advice given to you by your health care provider. Make sure you discuss any questions you have with your health care provider. Document Released: 05/02/2015 Document Revised: 12/24/2015 Document Reviewed: 02/04/2015 Elsevier Interactive Patient Education  2017 [place] Prevention in the Home Falls can cause injuries. They can happen to people of all ages. There are many things you can do to make your home safe and to help prevent falls. What can I do on the outside of my home? Regularly fix the edges of walkways and driveways and fix any cracks. Remove anything that might make you trip as you walk through a door, such as a raised step or threshold. Trim any bushes or trees on the path to your home. Use bright outdoor lighting. Clear any walking paths of anything that might make someone trip, such as rocks or tools. Regularly check to see if handrails are loose or broken. Make sure that both sides of any steps have handrails. Any raised decks and porches should have guardrails on the edges. Have any leaves, snow, or ice cleared regularly. Use sand or salt on walking paths during winter. Clean up any spills in your garage right away. This includes oil or grease spills. What can I do in the bathroom? Use night lights. Install grab bars by the toilet and in the tub and shower. Do not use towel bars as grab bars. Use non-skid mats or decals in the tub or shower. If you need to sit down in the shower, use a plastic, non-slip stool. Keep the floor dry. Clean up any water that spills on the floor as soon as it happens. Remove soap buildup in the tub or shower regularly. Attach bath mats securely with double-sided non-slip rug tape. Do not have throw rugs and other things on the floor that can make you trip. What can I do in the bedroom? Use night lights. Make sure that you have a  light by your bed that is easy to reach. Do not use any sheets or blankets that are too big for your bed. They should not hang down onto the floor. Have a firm chair that has side arms. You can use this for support while you get dressed. Do not have throw rugs and other things on the floor that can make you trip. What can I do in the kitchen? Clean up any spills right away. Avoid walking on wet floors. Keep items that you use a lot in easy-to-reach places. If you need to reach something above you, use a strong step stool that has a grab bar. Keep electrical cords out of the way. Do not use floor polish or wax that makes floors slippery. If you must use wax, use non-skid floor wax. Do not have throw rugs and other things on the floor that can make you trip. What can I do with my stairs? Do not leave any items on the stairs. Make sure that there are handrails on both sides of the stairs and use them. Fix handrails that are broken or loose. Make sure that handrails are as long as the stairways. Check any carpeting to make sure that it is firmly attached to the stairs. Fix any carpet that is loose or worn. Avoid having throw rugs at the top or bottom of the stairs. If you  do have throw rugs, attach them to the floor with carpet tape. Make sure that you have a light switch at the top of the stairs and the bottom of the stairs. If you do not have them, ask someone to add them for you. What else can I do to help prevent falls? Wear shoes that: Do not have high heels. Have rubber bottoms. Are comfortable and fit you well. Are closed at the toe. Do not wear sandals. If you use a stepladder: Make sure that it is fully opened. Do not climb a closed stepladder. Make sure that both sides of the stepladder are locked into place. Ask someone to hold it for you, if possible. Clearly mark and make sure that you can see: Any grab bars or handrails. First and last steps. Where the edge of each step  is. Use tools that help you move around (mobility aids) if they are needed. These include: Canes. Walkers. Scooters. Crutches. Turn on the lights when you go into a dark area. Replace any light bulbs as soon as they burn out. Set up your furniture so you have a clear path. Avoid moving your furniture around. If any of your floors are uneven, fix them. If there are any pets around you, be aware of where they are. Review your medicines with your doctor. Some medicines can make you feel dizzy. This can increase your chance of falling. Ask your doctor what other things that you can do to help prevent falls. This information is not intended to replace advice given to you by your health care provider. Make sure you discuss any questions you have with your health care provider. Document Released: 01/30/2009 Document Revised: 09/11/2015 Document Reviewed: 05/10/2014 Elsevier Interactive Patient Education  2017 [employer].

## 2021-12-17 ENCOUNTER — Other Ambulatory Visit: Payer: Self-pay | Admitting: *Deleted

## 2021-12-17 ENCOUNTER — Encounter: Payer: Self-pay | Admitting: *Deleted

## 2021-12-17 NOTE — Patient Outreach (Signed)
  Care Coordination   Initial Visit Note   12/17/2021 Name: Bridget Reyes MRN: [id number] DOB: 1945/10/10  Bridget Reyes is a 76 y.o. year old female who sees Panosh, Standley Brooking, MD for primary care. I spoke with  [name] by phone today.  What matters to the patients health and wellness today?  No needs    Goals Addressed               This Visit's Progress     COMPLETED: No needs (pt-stated)        Care Coordination Interventions: Reviewed medications with patient and discussed purpose of medications Reviewed scheduled/upcoming provider appointments including pending appointments and verified AWV 2023 scheduled on 12/28/2021 Screening for signs and symptoms of depression related to chronic disease state  Assessed social determinant of health barriers          SDOH assessments and interventions completed:  Yes  SDOH Interventions Today    Flowsheet Row Most Recent Value  SDOH Interventions   Food Insecurity Interventions Intervention Not Indicated  Housing Interventions Intervention Not Indicated  Transportation Interventions Intervention Not Indicated        Care Coordination Interventions Activated:  Yes  Care Coordination Interventions:  Yes, provided   Follow up plan: No further intervention required.   Encounter Outcome:  Pt. Visit Completed   Raina Mina, RN Care Management Coordinator [place] Office [phone]

## 2021-12-17 NOTE — Patient Instructions (Signed)
Visit Information  Thank you for taking time to visit with me today. Please don't hesitate to contact me if I can be of assistance to you.   Following are the goals we discussed today:   Goals Addressed               This Visit's Progress     COMPLETED: No needs (pt-stated)        Care Coordination Interventions: Reviewed medications with patient and discussed purpose of medications Reviewed scheduled/upcoming provider appointments including pending appointments and verified AWV 2023 scheduled on 12/28/2021 Screening for signs and symptoms of depression related to chronic disease state  Assessed social determinant of health barriers           Please call the care guide team at [phone] if you need to cancel or reschedule your appointment.   If you are experiencing a Mental Health or [place] or need someone to talk to, please call the Suicide and Crisis Lifeline: [phone]  Patient verbalizes understanding of instructions and care plan provided today and agrees to view in [place]. Active MyChart status and patient understanding of how to access instructions and care plan via MyChart confirmed with patient.     Raina Mina, RN Care Management Coordinator [place] Office [phone]

## 2021-12-26 ENCOUNTER — Other Ambulatory Visit: Payer: Self-pay | Admitting: Internal Medicine

## 2021-12-27 NOTE — Progress Notes (Signed)
Chief Complaint  Patient presents with   Annual Exam    HPI: Patient  Bridget Reyes  76 y.o. comes in today for [place] visit  and fu medications  and conditions  Bp not  dx with ht  HLD :  simvastatin  denies se  Citalopram : helpful ? Not sure   .  Has been on a while  doing well since has been on med    Thyroid hx : need  fu hx of abnormal  To have cataracts    surgery  Macular degeneration.   Recent  dx amd.  Cold sores   refill Valcyclovir  Specialists  Gi, Gyne Opthal, skin Amy [place], Health Maintenance  Topic Date Due   COVID-19 Vaccine (5 - Pfizer risk series) 12/21/2020   INFLUENZA VACCINE  01/28/2023 (Originally 11/17/2021)   COLONOSCOPY (Pts 45-19yr Insurance coverage will need to be confirmed)  10/10/2023   TETANUS/TDAP  11/14/2023   Pneumonia Vaccine 76 Years old  Completed   DEXA SCAN  Completed   Hepatitis C Screening  Completed   Zoster Vaccines- Shingrix  Completed   HPV VACCINES  Aged Out   Health Maintenance Review LIFESTYLE:  Exercise:  walks  10 k steps  6 d per week.  Tobacco/ETS: n Alcohol:  2 per week  Sugar beverages: diet drinks  Sleep: sll over the place right now.  Drug use: no HH of   2 no pets   grand pet dogs  Hemp gummies   to sleep. Can help.     ROS:  REST of 12 system review negative except as per HPI  allergy    Past Medical History:  Diagnosis Date   Allergy    Anxiety    not treated    Arthritis    knees, neck - no meds   Cataract    bilateral - MD just watchning   Colon polyps    Crohn's    Diverticulosis    Eczema    Esophageal reflux    Gastritis    Gout    Hiatal hernia    Hyperlipemia    Ileitis    Migraines    tx with naproxen - last one 4 yrs ago   Rosacea    Skin cancer of face    scca removed     Past Surgical History:  Procedure Laterality Date   BREAST BIOPSY Left 1980   COLONOSCOPY  08/2015   Pyrtle -polyp   DILATION AND CURETTAGE OF UTERUS     POLYPECTOMY      TONSILLECTOMY     TOTAL VAGINAL HYSTERECTOMY  1994   UPPER GASTROINTESTINAL ENDOSCOPY     UPPER GI ENDOSCOPY     hiatal hernia   WISDOM TOOTH EXTRACTION      Family History  Problem Relation Age of Onset   Parkinsonism Mother    Lung cancer Mother    Heart disease Father        died chf in 779 s  Hypertension Father    Congestive Heart Failure Father    Colon polyps Sister    Breast cancer Daughter    Breast cancer Paternal Aunt    Breast cancer Cousin    Colon cancer Neg Hx    Esophageal cancer Neg Hx    Rectal cancer Neg Hx    Stomach cancer Neg Hx     Social History   Socioeconomic History   Marital status: Married  Spouse name: Not on file   Number of children: 2   Years of education: Not on file   Highest education level: Not on file  Occupational History   Occupation: retired     Comment: school Product manager: UNEMPLOYED  Tobacco Use   Smoking status: Never   Smokeless tobacco: Never  Vaping Use   Vaping Use: Never used  Substance and Sexual Activity   Alcohol use: Yes    Alcohol/week: 1.0 - 2.0 standard drink of alcohol    Types: 1 - 2 Glasses of wine per week   Drug use: No   Sexual activity: Yes    Partners: Male    Birth control/protection: Surgical    Comment: hysterectomy  Other Topics Concern   Not on file  Social History Narrative   hh  Of 2       Dog    Married    G2P2   Social Determinants of Health   Financial Resource Strain: Low Risk  (12/14/2021)   Overall Financial Resource Strain (CARDIA)    Difficulty of Paying Living Expenses: Not hard at all  Food Insecurity: No Food Insecurity (12/17/2021)   Hunger Vital Sign    Worried About Running Out of Food in the Last Year: Never true    Ran Out of Food in the Last Year: Never true  Transportation Needs: No Transportation Needs (12/17/2021)   PRAPARE - Hydrologist (Medical): No    Lack of Transportation (Non-Medical): No  Physical Activity:  Sufficiently Active (12/14/2021)   Exercise Vital Sign    Days of Exercise per Week: 5 days    Minutes of Exercise per Session: 60 min  Stress: Stress Concern Present (12/14/2021)   [place]    Feeling of Stress : To some extent  Social Connections: Socially Integrated (12/14/2021)   Social Connection and Isolation Panel [NHANES]    Frequency of Communication with Friends and Family: Patient refused    Frequency of Social Gatherings with Friends and Family: More than three times a week    Attends Religious Services: More than 4 times per year    Active Member of [employer] or Organizations: Yes    Attends Music therapist: More than 4 times per year    Marital Status: Married    Outpatient Medications Prior to Visit  Medication Sig Dispense Refill   acetaminophen (TYLENOL) 500 MG tablet Take 500 mg by mouth every 6 (six) hours as needed.     Azelaic Acid (FINACEA) 15 % cream After skin is thoroughly washed and patted dry, gently but thoroughly massage a thin film of azelaic acid cream into the affected area twice daily, in the morning and evening. 50 g 11   mesalamine (PENTASA) 500 MG CR capsule TAKE 3 CAPSULES BY MOUTH TWICE DAILY 540 capsule 3   Misc. Devices (VAGINAL SUPPOSITORY APPLICATOR) MISC by Does not apply route. Vitamin E As needed     MULTIPLE VITAMIN PO Take 1 tablet by mouth daily.     NON FORMULARY as needed. CBD Cream- applies prn to muscles, joints     NONFORMULARY OR COMPOUNDED ITEM Vitamin E vaginal suppositories 200u/ml.  One pv three times weekly. 36 each 3   omeprazole (PRILOSEC) 40 MG capsule Take 1 capsule (40 mg total) by mouth daily. 90 capsule 3   citalopram (CELEXA) 20 MG tablet TAKE 1 TABLET(20 MG) BY MOUTH DAILY 90  tablet 3   ipratropium (ATROVENT) 0.06 % nasal spray instill 2 sprays into each nostril twice a day to three times a day 45 mL prn   simvastatin (ZOCOR) 40 MG tablet Take 1  tablet (40 mg total) by mouth daily. 90 tablet 3   valACYclovir (VALTREX) 1000 MG tablet Take 2 tablets (2,000 mg total) by mouth 2 (two) times daily as needed. 90 tablet 3   No facility-administered medications prior to visit.     EXAM:  BP (!) 160/76 (BP Location: Right Arm, Cuff Size: Normal)   Pulse 65   Temp 97.7 F (36.5 C) (Oral)   Ht 5' 5.5" (1.664 m)   Wt 142 lb 12.8 oz (64.8 kg)   LMP 04/19/1992   SpO2 97%   BMI 23.40 kg/m   Body mass index is 23.4 kg/m. Wt Readings from Last 3 Encounters:  12/28/21 142 lb 12.8 oz (64.8 kg)  12/14/21 142 lb (64.4 kg)  07/02/21 146 lb 2 oz (66.3 kg)    Physical Exam: Vital signs reviewed JQZ:ESPQ is a well-developed well-nourished alert cooperative    who appearsr stated age in no acute distress.  HEENT: normocephalic atraumatic , Eyes: PERRL EOM's full, conjunctiva clear, Nares: paten,t no deformity discharge or tenderness., Ears: no deformity EAC's clear TMs with normal landmarks. Mouth: clear OP, no lesions, edema.  Moist mucous membranes. Dentition in adequate repair. NECK: supple without masses, thyromegaly or bruits. CHEST/PULM:  Clear to auscultation and percussion breath sounds equal no wheeze , rales or rhonchi.  Breast: normal by inspection . No dimpling, discharge, masses, tenderness or discharge . CV: PMI is nondisplaced, S1 S2 no gallops, murmurs, rubs. Peripheral pulses are full without delay. ABDOMEN: Bowel sounds normal nontender  No guard or rebound, no hepato splenomegal no CVA tenderness. Extremtities:  No clubbing cyanosis or edema, no acute joint swelling or redness no focal atrophy NEURO:  Oriented x3, cranial nerves 3-12 appear to be intact, no obvious focal weakness,gait within normal limits no abnormal reflexes or asymmetrical SKIN: No acute rashes normal turgor, color, no bruising or petechiae. PSYCH: Oriented, good eye contact, no obvious depression anxiety, cognition and judgment appear normal. LN: no  cervical axillary adenopathy  Lab Results  Component Value Date   WBC 5.1 12/28/2021   HGB 13.9 12/28/2021   HCT 40.5 12/28/2021   PLT 235.0 12/28/2021   GLUCOSE 82 12/28/2021   CHOL 146 12/28/2021   TRIG 96.0 12/28/2021   HDL 47.50 12/28/2021   LDLCALC 80 12/28/2021   ALT 19 12/28/2021   AST 22 12/28/2021   NA 137 12/28/2021   K 4.2 12/28/2021   CL 100 12/28/2021   CREATININE 0.84 12/28/2021   BUN 13 12/28/2021   CO2 32 12/28/2021   TSH 5.40 12/28/2021   HGBA1C 5.3 12/21/2019    BP Readings from Last 3 Encounters:  12/28/21 (!) 160/76  07/02/21 120/64  01/16/21 (!) 145/67    Lab plan  reviewed with patient   ASSESSMENT AND PLAN:  Discussed the following assessment and plan:    ICD-10-CM   1. Visit for preventive health examination  Z00.00     2. Hyperlipidemia, unspecified hyperlipidemia type  Z30.0 Basic metabolic panel    CBC with Differential/Platelet    Hepatic function panel    Lipid panel    TSH    T4, Free    T4, Free    TSH    Lipid panel    Hepatic function panel    CBC with  Differential/Platelet    Basic metabolic panel   monitor simvastin    3. Hearing aid worn  E75.1 Basic metabolic panel    CBC with Differential/Platelet    Hepatic function panel    Lipid panel    TSH    T4, Free    T4, Free    TSH    Lipid panel    Hepatic function panel    CBC with Differential/Platelet    Basic metabolic panel    4. Medication management  Z00.174 Basic metabolic panel    CBC with Differential/Platelet    Hepatic function panel    Lipid panel    TSH    T4, Free    T4, Free    TSH    Lipid panel    Hepatic function panel    CBC with Differential/Platelet    Basic metabolic panel   cont citalopram reorder vlatrex ipatropium     5. Crohn's disease of both small and large intestine without complication (HCC)  B44.96 Basic metabolic panel    CBC with Differential/Platelet    Hepatic function panel    Lipid panel    TSH    T4, Free     T4, Free    TSH    Lipid panel    Hepatic function panel    CBC with Differential/Platelet    Basic metabolic panel    6. History of gout  P59.16 Basic metabolic panel    CBC with Differential/Platelet    Hepatic function panel    Lipid panel    TSH    T4, Free    T4, Free    TSH    Lipid panel    Hepatic function panel    CBC with Differential/Platelet    Basic metabolic panel    7. Elevated blood pressure reading  B84.6 Basic metabolic panel    CBC with Differential/Platelet    Hepatic function panel    Lipid panel    TSH    T4, Free    T4, Free    TSH    Lipid panel    Hepatic function panel    CBC with Differential/Platelet    Basic metabolic panel    8. Abnormal thyroid exam  K59.9 Basic metabolic panel    CBC with Differential/Platelet    Hepatic function panel    Lipid panel    TSH    T4, Free    T4, Free    TSH    Lipid panel    Hepatic function panel    CBC with Differential/Platelet    Basic metabolic panel    Wants to wait on flu shot until later  Needs Bp fu confirmation.  Return for depending on results and bp readings and  how doing.  or yearly . Marland Kitchen  Patient Care Team: Jacson Rapaport, Bridget Brooking, MD as PCP - General Pedro Earls, MD (Family Medicine) [place], Amy, MD (Dermatology) Megan Salon, MD (Gynecology) Orie Rout, MD as Referring Physician (Specialist) Marygrace Drought, MD (Ophthalmology) Jerrell Belfast, MD as Consulting Physician (Otolaryngology) Marica Otter, [place] (Optometry) Pyrtle, Lajuan Lines, MD as Consulting Physician (Gastroenterology) Patient Instructions  Take blood pressure readings twice a day for 5-7  and then periodically .To ensure below 140/90   .Send in readings      Lab today . Consdier rsv vaccine and flu vaccine.  Wil refill   Bridget Reyes. Bridget Reyes M.D.

## 2021-12-28 ENCOUNTER — Ambulatory Visit (INDEPENDENT_AMBULATORY_CARE_PROVIDER_SITE_OTHER): Payer: Medicare Other | Admitting: Internal Medicine

## 2021-12-28 VITALS — BP 160/76 | HR 65 | Temp 97.7°F | Ht 65.5 in | Wt 142.8 lb

## 2021-12-28 DIAGNOSIS — Z974 Presence of external hearing-aid: Secondary | ICD-10-CM | POA: Diagnosis not present

## 2021-12-28 DIAGNOSIS — R7301 Impaired fasting glucose: Secondary | ICD-10-CM

## 2021-12-28 DIAGNOSIS — R030 Elevated blood-pressure reading, without diagnosis of hypertension: Secondary | ICD-10-CM

## 2021-12-28 DIAGNOSIS — R946 Abnormal results of thyroid function studies: Secondary | ICD-10-CM

## 2021-12-28 DIAGNOSIS — Z0000 Encounter for general adult medical examination without abnormal findings: Secondary | ICD-10-CM

## 2021-12-28 DIAGNOSIS — K5080 Crohn's disease of both small and large intestine without complications: Secondary | ICD-10-CM | POA: Diagnosis not present

## 2021-12-28 DIAGNOSIS — Z8739 Personal history of other diseases of the musculoskeletal system and connective tissue: Secondary | ICD-10-CM | POA: Diagnosis not present

## 2021-12-28 DIAGNOSIS — E785 Hyperlipidemia, unspecified: Secondary | ICD-10-CM

## 2021-12-28 DIAGNOSIS — Z79899 Other long term (current) drug therapy: Secondary | ICD-10-CM

## 2021-12-28 DIAGNOSIS — K639 Disease of intestine, unspecified: Secondary | ICD-10-CM

## 2021-12-28 LAB — BASIC METABOLIC PANEL
BUN: 13 mg/dL (ref 6–23)
CO2: 32 mEq/L (ref 19–32)
Calcium: 9.7 mg/dL (ref 8.4–10.5)
Chloride: 100 mEq/L (ref 96–112)
Creatinine, Ser: 0.84 mg/dL (ref 0.40–1.20)
GFR: 67.35 mL/min (ref >60.00)
Glucose, Bld: 82 mg/dL (ref 70–99)
Potassium: 4.2 mEq/L (ref 3.5–5.1)
Sodium: 137 mEq/L (ref 135–145)

## 2021-12-28 LAB — CBC WITH DIFFERENTIAL/PLATELET
Basophils Absolute: 0.1 10*3/uL (ref 0.0–0.1)
Basophils Relative: 1.2 % (ref 0.0–3.0)
Eosinophils Absolute: 0.1 10*3/uL (ref 0.0–0.7)
Eosinophils Relative: 2.3 % (ref 0.0–5.0)
HCT: 40.5 % (ref 36.0–46.0)
Hemoglobin: 13.9 g/dL (ref 12.0–15.0)
Lymphocytes Relative: 23.5 % (ref 12.0–46.0)
Lymphs Abs: 1.2 10*3/uL (ref 0.7–4.0)
MCHC: 34.4 g/dL (ref 30.0–36.0)
MCV: 94.1 fl (ref 78.0–100.0)
Monocytes Absolute: 0.5 10*3/uL (ref 0.1–1.0)
Monocytes Relative: 10.1 % (ref 3.0–12.0)
Neutro Abs: 3.2 10*3/uL (ref 1.4–7.7)
Neutrophils Relative %: 62.9 % (ref 43.0–77.0)
Platelets: 235 10*3/uL (ref 150.0–400.0)
RBC: 4.3 Mil/uL (ref 3.87–5.11)
RDW: 12.4 % (ref 11.5–15.5)
WBC: 5.1 10*3/uL (ref 4.0–10.5)

## 2021-12-28 LAB — HEPATIC FUNCTION PANEL
ALT: 19 U/L (ref 0–35)
AST: 22 U/L (ref 0–37)
Albumin: 4.3 g/dL (ref 3.5–5.2)
Alkaline Phosphatase: 81 U/L (ref 39–117)
Bilirubin, Direct: 0.1 mg/dL (ref 0.0–0.3)
Total Bilirubin: 0.4 mg/dL (ref 0.2–1.2)
Total Protein: 7.4 g/dL (ref 6.0–8.3)

## 2021-12-28 LAB — LIPID PANEL
Cholesterol: 146 mg/dL (ref 0–200)
HDL: 47.5 mg/dL (ref >39.00)
LDL Cholesterol: 80 mg/dL (ref 0–99)
NonHDL: 98.91
Total CHOL/HDL Ratio: 3
Triglycerides: 96 mg/dL (ref 0.0–149.0)
VLDL: 19.2 mg/dL (ref 0.0–40.0)

## 2021-12-28 LAB — T4, FREE: Free T4: 0.75 ng/dL (ref 0.60–1.60)

## 2021-12-28 LAB — TSH: TSH: 5.4 u[IU]/mL (ref 0.35–5.50)

## 2021-12-28 MED ORDER — VALACYCLOVIR HCL 1 G PO TABS: 2000.0000 mg | ORAL_TABLET | Freq: Two times a day (BID) | ORAL | 3 refills | Status: DC | PRN

## 2021-12-28 MED ORDER — CITALOPRAM HYDROBROMIDE 20 MG PO TABS
ORAL_TABLET | ORAL | 3 refills | Status: DC
Start: 2021-12-28 — End: 2022-11-30

## 2021-12-28 MED ORDER — IPRATROPIUM BROMIDE 0.06 % NA SOLN
NASAL | 99 refills | Status: DC
Start: 2021-12-28 — End: 2023-02-14

## 2021-12-28 MED ORDER — SIMVASTATIN 40 MG PO TABS: 40.0000 mg | ORAL_TABLET | Freq: Every day | ORAL | 3 refills | Status: DC

## 2021-12-28 NOTE — Progress Notes (Signed)
All Lab results are in range .  Make sure to send [place] BP readings  when available

## 2021-12-28 NOTE — Patient Instructions (Signed)
Take blood pressure readings twice a day for 5-7  and then periodically .To ensure below 140/90   .Send in readings      Lab today . Consdier rsv vaccine and flu vaccine.  Wil refill

## 2022-01-03 ENCOUNTER — Encounter: Payer: Self-pay | Admitting: Internal Medicine

## 2022-01-04 ENCOUNTER — Ambulatory Visit: Payer: Medicare Other

## 2022-01-04 ENCOUNTER — Encounter: Payer: Self-pay | Admitting: Internal Medicine

## 2022-01-07 NOTE — Telephone Encounter (Signed)
Thanks you for the readings  They look good  .   BP Readings from Last 3 Encounters:  12/28/21 (!) 160/76  07/02/21 120/64  01/16/21 (!) 145/67

## 2022-01-17 HISTORY — PX: CATARACT EXTRACTION, BILATERAL: SHX1313

## 2022-01-20 ENCOUNTER — Ambulatory Visit
Admission: RE | Admit: 2022-01-20 | Discharge: 2022-01-20 | Disposition: A | Discharge status: Home / Self Care | Payer: Medicare Other | Source: Ambulatory Visit | Attending: Obstetrics & Gynecology | Admitting: Obstetrics & Gynecology

## 2022-01-20 DIAGNOSIS — Z1231 Encounter for screening mammogram for malignant neoplasm of breast: Secondary | ICD-10-CM | POA: Diagnosis not present

## 2022-02-01 DIAGNOSIS — H2511 Age-related nuclear cataract, right eye: Secondary | ICD-10-CM | POA: Diagnosis not present

## 2022-02-02 DIAGNOSIS — H2512 Age-related nuclear cataract, left eye: Secondary | ICD-10-CM | POA: Diagnosis not present

## 2022-02-02 DIAGNOSIS — H25042 Posterior subcapsular polar age-related cataract, left eye: Secondary | ICD-10-CM | POA: Diagnosis not present

## 2022-02-02 DIAGNOSIS — H25012 Cortical age-related cataract, left eye: Secondary | ICD-10-CM | POA: Diagnosis not present

## 2022-02-09 DIAGNOSIS — Z961 Presence of intraocular lens: Secondary | ICD-10-CM | POA: Diagnosis not present

## 2022-02-09 DIAGNOSIS — H524 Presbyopia: Secondary | ICD-10-CM | POA: Diagnosis not present

## 2022-02-09 DIAGNOSIS — H2511 Age-related nuclear cataract, right eye: Secondary | ICD-10-CM | POA: Diagnosis not present

## 2022-02-09 DIAGNOSIS — Z9841 Cataract extraction status, right eye: Secondary | ICD-10-CM | POA: Diagnosis not present

## 2022-02-15 DIAGNOSIS — H2512 Age-related nuclear cataract, left eye: Secondary | ICD-10-CM | POA: Diagnosis not present

## 2022-02-17 DIAGNOSIS — H903 Sensorineural hearing loss, bilateral: Secondary | ICD-10-CM | POA: Diagnosis not present

## 2022-02-23 DIAGNOSIS — H2512 Age-related nuclear cataract, left eye: Secondary | ICD-10-CM | POA: Diagnosis not present

## 2022-02-23 DIAGNOSIS — Z961 Presence of intraocular lens: Secondary | ICD-10-CM | POA: Diagnosis not present

## 2022-02-25 DIAGNOSIS — H6122 Impacted cerumen, left ear: Secondary | ICD-10-CM | POA: Diagnosis not present

## 2022-05-26 ENCOUNTER — Ambulatory Visit: Payer: Medicare Other | Admitting: Nurse Practitioner

## 2022-05-26 ENCOUNTER — Encounter: Payer: Self-pay | Admitting: Nurse Practitioner

## 2022-05-26 VITALS — BP 126/62 | HR 70 | Ht 65.5 in | Wt 148.8 lb

## 2022-05-26 DIAGNOSIS — K219 Gastro-esophageal reflux disease without esophagitis: Secondary | ICD-10-CM | POA: Diagnosis not present

## 2022-05-26 DIAGNOSIS — K5000 Crohn's disease of small intestine without complications: Secondary | ICD-10-CM | POA: Diagnosis not present

## 2022-05-26 MED ORDER — MESALAMINE ER 500 MG PO CPCR: ORAL_CAPSULE | ORAL | 3 refills | Status: DC

## 2022-05-26 MED ORDER — OMEPRAZOLE 40 MG PO CPDR: 40.0000 mg | DELAYED_RELEASE_CAPSULE | Freq: Two times a day (BID) | ORAL | 0 refills | Status: DC

## 2022-05-26 NOTE — Progress Notes (Signed)
05/26/2022 Bridget Reyes [id number] 01/02/1946   Chief Complaint: Medication refill   History of Present Illness: Bridget Reyes. Bridget Reyes is a 77 year old female with a past medical history of migraine headaches, hyperlipidemia, GERD, hiatal hernia, a sessile serrated colon polyp and ileal Crohn's disease.  She was last seen by Dr. Pyrtle/16/2023.  At that time, her Crohn's symptoms were stable on Mesalamine 1.5 gm bid.  She was not experiencing any heartburn and her throat clearing symptoms improved.  She continued to have tickling/itching sensations in her throat which was thought to be possibly due to postnasal drainage and Claritin was changed to Zyrtec with instructions if no improvement to increase Omeprazole 40 mg twice daily for 3 weeks and to consider ENT evaluation.    Currently, she remains on Mesalamine 1.5 g twice daily.  She denies having any significant abdominal pain.  She sometimes has a twinge of RLQ Q discomfort.  She is passing normal formed brown bowel movement daily.  No rectal bleeding or mucus per the rectum.  No black stools.  Her most recent colonoscopy was 10/10/2018 which showed a few erosions in the terminal ileum consistent with very mild ileal Crohn's disease and diverticulosis in the sigmoid colon without polyps.  A repeat colonoscopy was recommended in 5 years due to a 10 mm sessile serrated polyp removed from the colon per colonoscopy 08/2015. Sister with history of colon polyps.   She did not recall if she increased Omeprazole twice daily following her last office visit.  She remains on Omeprazole 40 mg daily.  She denies having any dysphagia or heartburn, however, she continues to have random episodes of throat itchiness.  She last experienced throat itchiness yesterday.  No associated throat tightness.  He has not noticed any postnasal drainage.  She has not seen ENT for this issue. Her most recent EGD was 10/2015 which was normal.     Latest Ref Rng & Units  12/28/2021    9:52 AM 12/23/2020    9:11 AM 12/21/2019    9:29 AM  CBC  WBC 4.0 - 10.5 K/uL 5.1  5.6  4.5   Hemoglobin 12.0 - 15.0 g/dL 13.9  13.6  14.2   Hematocrit 36.0 - 46.0 % 40.5  40.0  42.7   Platelets 150.0 - 400.0 K/uL 235.0  238.0  241        Latest Ref Rng & Units 12/28/2021    9:52 AM 12/23/2020    9:11 AM 03/10/2020    8:29 AM  CMP  Glucose 70 - 99 mg/dL 82  98    BUN 6 - 23 mg/dL 13  19    Creatinine 0.40 - 1.20 mg/dL 0.84  0.90    Sodium 135 - 145 mEq/L 137  135    Potassium 3.5 - 5.1 mEq/L 4.2  4.2    Chloride 96 - 112 mEq/L 100  99    CO2 19 - 32 mEq/L 32  29    Calcium 8.4 - 10.5 mg/dL 9.7  9.7    Total Protein 6.0 - 8.3 g/dL 7.4  7.6  7.1   Total Bilirubin 0.2 - 1.2 mg/dL 0.4  0.5  0.4   Alkaline Phos 39 - 117 U/L 81  78    AST 0 - 37 U/L '22  21  20   '$ ALT 0 - 35 U/L '19  21  18      '$ MOST RECENT GI PROCEDURES:  Colonoscopy 10/10/2018:   EGD  11/06/2015: Normal esophagus, stomach and duodenum  Colonoscopy 08/18/2015:  SESSILE SERRATED POLYP/ADENOMA. - NO DYSPLASIA OR MALIGNANCY  Current Outpatient Medications on File Prior to Visit  Medication Sig Dispense Refill   acetaminophen (TYLENOL) 500 MG tablet Take 500 mg by mouth every 6 (six) hours as needed.     Azelaic Acid (FINACEA) 15 % cream After skin is thoroughly washed and patted dry, gently but thoroughly massage a thin film of azelaic acid cream into the affected area twice daily, in the morning and evening. 50 g 11   citalopram (CELEXA) 20 MG tablet TAKE 1 TABLET(20 MG) BY MOUTH DAILY 90 tablet 3   ipratropium (ATROVENT) 0.06 % nasal spray instill 2 sprays into each nostril twice a day to three times a day 45 mL prn   MULTIPLE VITAMIN PO Take 1 tablet by mouth daily.     NON FORMULARY as needed. CBD Cream- applies prn to muscles, joints     NONFORMULARY OR COMPOUNDED ITEM Vitamin E vaginal suppositories 200u/ml.  One pv three times weekly. 36 each 3   omeprazole (PRILOSEC) 40 MG capsule Take 1 capsule  (40 mg total) by mouth daily. 90 capsule 3   simvastatin (ZOCOR) 40 MG tablet Take 1 tablet (40 mg total) by mouth daily. 90 tablet 3   valACYclovir (VALTREX) 1000 MG tablet Take 2 tablets (2,000 mg total) by mouth 2 (two) times daily as needed. 90 tablet 3   No current facility-administered medications on file prior to visit.   Allergies  Allergen Reactions   Sulfamethoxazole-Trimethoprim Hives    REACTION: rash   Amoxicillin     REACTION: unspecified   Bactrim [Sulfamethoxazole-Trimethoprim]    Penicillins Hives   Sertraline     Hair loss    Tetracaine Itching and Swelling   Topamax [Topiramate]     Hair loss   Remeron [Mirtazapine] Other (See Comments)    Increased ravenous appetite   Current Medications, Allergies, Past Medical History, Past Surgical History, Family History and Social History were reviewed in [employer] record.  Review of Systems:   Constitutional: Negative for fever, sweats, chills or weight loss.  Respiratory: Negative for shortness of breath.   Cardiovascular: Negative for chest pain, palpitations and leg swelling.  Gastrointestinal: See HPI.  Musculoskeletal: Negative for back pain or muscle aches.  Neurological: Negative for dizziness, headaches or paresthesias.   Physical Exam: LMP 04/19/1992  BP 126/62   Pulse 70   Ht 5' 5.5" (1.664 m)   Wt 148 lb 12.8 oz (67.5 kg)   LMP 04/19/1992   BMI 24.39 kg/m   General: 77 year old female in no acute distress. Head: Normocephalic and atraumatic. Eyes: No scleral icterus. Conjunctiva pink . Ears: Normal auditory acuity. Mouth: Dentition intact. No ulcers or lesions.  Lungs: Clear throughout to auscultation. Heart: Regular rate and rhythm, no murmur. Abdomen: Soft, nontender and nondistended. No masses or hepatomegaly. Normal bowel sounds x 4 quadrants.  Rectal: Deferred.  Musculoskeletal: Symmetrical with no gross deformities. Extremities: No edema. Neurological: Alert  oriented x 4. No focal deficits.  Psychological: Alert and cooperative. Normal mood and affect  Assessment and Recommendations:  73) 77 year old female with ileal Crohn's disease, stable.  Colonoscopy 09/2018 showed a few erosions in the terminal ileum consistent with very mild ileal Crohn's disease. -Continue Pentasa 1.5gm 3 capsules bid  -Diet as tolerated -Follow-up in 1 year and as needed  2) GERD with possible LPR.  Normal EGD in 2017.  GERD symptoms well-controlled  on Omeprazole 40 mg daily.  Recurrent sporadic throat itchiness. -Increase omeprazole 40 mg twice daily to be taken 30 minutes before breakfast and dinner for 1 month for possible LPR -Patient to contact office in 1 month with an update -ENT referral   3) History of a 70m sessile serrated adenomatous polyp removed from the ascending colon per colonoscopy 08/2015.  Colonoscopy 09/2018 showed a few erosions in the TI, diverticulosis without colon polyps. -Next colon polyp surveillance colonoscopy due June 2025  Today's encounter was 25 minutes which included precharting, chart/result review, history/exam, face-to-face time used for counseling, formulating a treatment plan with follow-up and documentation.

## 2022-05-26 NOTE — Patient Instructions (Addendum)
We have sent the following medications to your pharmacy for you to pick up at your convenience: Mesalamine & Omeprazole.  Increase Omeprazole '40mg'$  to one capsule twice daily for the next 4 weeks due to throat itchiness symptoms.   Contact our office in 1 month with an update.  Thank you for trusting me with your gastrointestinal care!   Carl Best, CRNP

## 2022-05-26 NOTE — Progress Notes (Signed)
Addendum: Reviewed and agree with assessment and management plan. Koren Plyler M, MD  

## 2022-05-27 ENCOUNTER — Encounter: Payer: Self-pay | Admitting: Internal Medicine

## 2022-05-27 MED ORDER — MESALAMINE ER 500 MG PO CPCR: ORAL_CAPSULE | ORAL | 11 refills | Status: DC

## 2022-05-27 NOTE — Telephone Encounter (Signed)
I have spoken to patient who tells me she cannot get the mesalamine 500 mg from [place] because she needs insurance coverage for the med. She states that she cannot get the medication from any other place than [facility] as this is the only place [facility] will allow her to go. I contacted [facility] and spoke to the pharmacy tech who advised that they do have meslamine 500 mg on hand but have placed an order for more to come in tomorrow as they do not have the full 30 days supply they would need for patient. She indicates that this medication is not difficult to get, rather it just needs to be ordered since they dont carry much in the store. I have left a message for patient with this information and advised I sent a script to [facility] since they are able to get the medication. I asked that she call me back with additional questions or concerns.

## 2022-05-27 NOTE — Telephone Encounter (Signed)
Her IBD is ileal and so the pentasa is really the only formulation that would work See is she would be willing to try and obtain from a [facility]

## 2022-08-17 ENCOUNTER — Other Ambulatory Visit (HOSPITAL_COMMUNITY): Payer: Self-pay

## 2022-08-21 ENCOUNTER — Other Ambulatory Visit: Payer: Self-pay | Admitting: Nurse Practitioner

## 2022-09-16 NOTE — Telephone Encounter (Signed)
[name],  Can you please advise on how you would like Bridget Reyes to wean from omeprazole?

## 2022-11-17 ENCOUNTER — Encounter (INDEPENDENT_AMBULATORY_CARE_PROVIDER_SITE_OTHER): Payer: Self-pay

## 2022-11-19 ENCOUNTER — Other Ambulatory Visit: Payer: Self-pay | Admitting: Nurse Practitioner

## 2022-11-27 ENCOUNTER — Other Ambulatory Visit: Payer: Self-pay | Admitting: Internal Medicine

## 2022-11-30 ENCOUNTER — Other Ambulatory Visit: Payer: Self-pay | Admitting: Obstetrics & Gynecology

## 2022-11-30 DIAGNOSIS — Z1231 Encounter for screening mammogram for malignant neoplasm of breast: Secondary | ICD-10-CM

## 2022-12-16 ENCOUNTER — Encounter: Payer: Medicare Other | Admitting: Family Medicine

## 2022-12-23 ENCOUNTER — Other Ambulatory Visit: Payer: Self-pay | Admitting: Internal Medicine

## 2023-01-03 ENCOUNTER — Encounter: Payer: Medicare Other | Admitting: Internal Medicine

## 2023-01-20 ENCOUNTER — Telehealth: Payer: Medicare Other | Admitting: Family Medicine

## 2023-01-20 VITALS — BP 117/57 | Ht 65.5 in | Wt 151.0 lb

## 2023-01-20 DIAGNOSIS — Z0000 Encounter for general adult medical examination without abnormal findings: Secondary | ICD-10-CM

## 2023-01-20 NOTE — Progress Notes (Signed)
PATIENT CHECK-IN and HEALTH RISK ASSESSMENT QUESTIONNAIRE:  -completed by phone/video for upcoming Medicare Preventive Visit  Pre-Visit Check-in: 1)Vitals (height, wt, BP, etc) - record in vitals section for visit on day of visit Request home vitals (wt, BP, etc.) and enter into vitals, THEN update Vital Signs SmartPhrase below at the top of the HPI. See below.  2)Review and Update Medications, Allergies PMH, Surgeries, Social history in Epic 3)Hospitalizations in the last year with date/reason? no  4)Review and Update Care Team (patient's specialists) in Epic 5) Complete PHQ9 in Epic  6) Complete Fall Screening in Epic 7)Review all Health Maintenance Due and order under PCP if not done.  8)Medicare Wellness Questionnaire: Answer theses question about your habits: Do you drink alcohol? Yes  If yes, how many drinks do you have a day?2-3 a week, usually not more than 1 a day Have you ever smoked?no Quit date if applicable? N/a  How many packs a day do/did you smoke? N/a Do you use smokeless tobacco?n/a Do you use an illicit drugs?n/a Do you exercises? Yes IF so, what type and how many days/minutes per week?walk for every day for 30 mins  Are you sexually active? Yes Number of partners?one  Typical breakfast:cottage cheese with apple sauce, oatmeal, egg Typical lunch: a sandwich-chicken, fruit, chip Typical dinner: chicken, vegebles, brown rice Typical snacks:fruits-banana, pretzel, weight watcher-cookies  Beverages: : diet coke, diet dr pepper, ice tea, water, coffee  Answer theses question about you: Can you perform most household chores?yes Do you find it hard to follow a conversation in a noisy room? No, wears hearing aid  Do you often ask people to speak up or repeat themselves?sometimes depends on situation.  Do you feel that you have a problem with memory?no Do you balance your checkbook and or bank acounts?no, it is done online automatically Do you feel safe at home?yes Last  dentist visit?6 months ago, has upcoming visit next week.  Do you need assistance with any of the following: Please note if so No  Driving?  Feeding yourself?  Getting from bed to chair?  Getting to the toilet?  Bathing or showering?  Dressing yourself?  Managing money?  Climbing a flight of stairs  Preparing meals?  Do you have Advanced Directives in place (Living Will, Healthcare Power or Attorney)? yes   Last eye Exam and location?had cataract surgery this past year. 02/2022. In [facility], in [place]. Has upcoming appt with them.    Do you currently use prescribed or non-prescribed narcotic or opioid pain medications?no  Do you have a history or close family history of breast, ovarian, tubal or peritoneal cancer or a family member with BRCA (breast cancer susceptibility 1 and 2) gene mutations? Daughter had breast cancer, now is a survivor.   Request home vitals (wt, BP, etc.) and enter into vitals, THEN update Vital Signs SmartPhrase below at the top of the HPI. See below.   Nurse/Assistant Credentials/time stamp:Karpuih Moyun/CMA/10:54am   ----------------------------------------------------------------------------------------------------------------------------------------------------------------------------------------------------------------------  Because this visit was a virtual/telehealth visit, some criteria may be missing or patient reported. Any vitals not documented were not able to be obtained and vitals that have been documented are patient reported.    MEDICARE ANNUAL PREVENTIVE VISIT WITH PROVIDER: (Welcome to Medicare, initial annual wellness or annual wellness exam)  Virtual Visit via Video Note  I connected with Bridget Reyes on 01/20/23 by a video enabled telemedicine application and verified that I am speaking with the correct person using two identifiers.  Location patient: home Location  provider:work or home office Persons participating in  the virtual visit: patient, provider  Concerns and/or follow up today: no concerns   See HM section in Epic for other details of completed HM.    ROS: negative for report of fevers, unintentional weight loss, vision changes, vision loss, hearing loss or change, chest pain, sob, hemoptysis, melena, hematochezia, hematuria, falls, bleeding or bruising, thoughts of suicide or self harm, memory loss  Patient-completed extensive health risk assessment - reviewed and discussed with the patient: See Health Risk Assessment completed with patient prior to the visit either above or in recent phone note. This was reviewed in detailed with the patient today and appropriate recommendations, orders and referrals were placed as needed per Summary below and patient instructions.   Review of Medical History: -PMH, PSH, Family History and current specialty and care providers reviewed and updated and listed below   Patient Care Team: Panosh, Neta Mends, MD as PCP - General Delfin Gant, MD (Family Medicine) [place], Amy, MD (Dermatology) Jerene Bears, MD (Gynecology) Santiago Glad, MD as Referring Physician (Specialist) Janet Berlin, MD (Ophthalmology) Osborn Coho, MD (Inactive) as Consulting Physician (Otolaryngology) Blima Ledger, OD (Optometry) Pyrtle, Carie Caddy, MD as Consulting Physician (Gastroenterology)   Past Medical History:  Diagnosis Date   Allergy    Anxiety    not treated    Arthritis    knees, neck - no meds   Cataract    bilateral - MD just watchning   Colon polyps    Crohn's    Diverticulosis    Eczema    Esophageal reflux    Gastritis    Gout    Hiatal hernia    Hyperlipemia    Ileitis    Migraines    tx with naproxen - last one 4 yrs ago   Rosacea    Skin cancer of face    scca removed     Past Surgical History:  Procedure Laterality Date   BREAST BIOPSY Left 1980   CATARACT EXTRACTION, BILATERAL  01/2022   COLONOSCOPY  08/2015   Pyrtle -polyp    DILATION AND CURETTAGE OF UTERUS     POLYPECTOMY     TONSILLECTOMY     TOTAL VAGINAL HYSTERECTOMY  1994   UPPER GASTROINTESTINAL ENDOSCOPY     UPPER GI ENDOSCOPY     hiatal hernia   WISDOM TOOTH EXTRACTION      Social History   Socioeconomic History   Marital status: Married    Spouse name: Not on file   Number of children: 2   Years of education: Not on file   Highest education level: Not on file  Occupational History   Occupation: retired     Comment: school Magazine features editor: UNEMPLOYED  Tobacco Use   Smoking status: Never   Smokeless tobacco: Never  Vaping Use   Vaping status: Never Used  Substance and Sexual Activity   Alcohol use: Yes    Alcohol/week: 1.0 - 2.0 standard drink of alcohol    Types: 1 - 2 Glasses of wine per week   Drug use: No   Sexual activity: Yes    Partners: Male    Birth control/protection: Surgical    Comment: hysterectomy  Other Topics Concern   Not on file  Social History Narrative   hh  Of 2       Dog    Married    G2P2   Social Determinants of Health   Financial Resource Strain:  Low Risk  (12/14/2021)   Overall Financial Resource Strain (CARDIA)    Difficulty of Paying Living Expenses: Not hard at all  Food Insecurity: No Food Insecurity (12/17/2021)   Hunger Vital Sign    Worried About Running Out of Food in the Last Year: Never true    Ran Out of Food in the Last Year: Never true  Transportation Needs: No Transportation Needs (12/17/2021)   PRAPARE - Administrator, Civil Service (Medical): No    Lack of Transportation (Non-Medical): No  Physical Activity: Sufficiently Active (12/14/2021)   Exercise Vital Sign    Days of Exercise per Week: 5 days    Minutes of Exercise per Session: 60 min  Stress: Stress Concern Present (12/14/2021)   [employer] of Occupational Health - Occupational Stress Questionnaire    Feeling of Stress : To some extent  Social Connections: Socially Integrated (12/14/2021)   Social  Connection and Isolation Panel [NHANES]    Frequency of Communication with Friends and Family: Patient declined    Frequency of Social Gatherings with Friends and Family: More than three times a week    Attends Religious Services: More than 4 times per year    Active Member of [employer] or Organizations: Yes    Attends Engineer, structural: More than 4 times per year    Marital Status: Married  Catering manager Violence: Not At Risk (12/14/2021)   Humiliation, Afraid, Rape, and Kick questionnaire    Fear of Current or Ex-Partner: No    Emotionally Abused: No    Physically Abused: No    Sexually Abused: No    Family History  Problem Relation Age of Onset   Parkinsonism Mother    Lung cancer Mother    Heart disease Father        died chf in 27 s   Hypertension Father    Congestive Heart Failure Father    Colon polyps Sister    Breast cancer Daughter    Breast cancer Paternal Aunt    Breast cancer Cousin    Colon cancer Neg Hx    Esophageal cancer Neg Hx    Rectal cancer Neg Hx    Stomach cancer Neg Hx     Current Outpatient Medications on File Prior to Visit  Medication Sig Dispense Refill   acetaminophen (TYLENOL) 500 MG tablet Take 500 mg by mouth every 6 (six) hours as needed.     Azelaic Acid (FINACEA) 15 % cream After skin is thoroughly washed and patted dry, gently but thoroughly massage a thin film of azelaic acid cream into the affected area twice daily, in the morning and evening. 50 g 11   citalopram (CELEXA) 20 MG tablet TAKE 1 TABLET(20 MG) BY MOUTH DAILY 90 tablet 3   ipratropium (ATROVENT) 0.06 % nasal spray instill 2 sprays into each nostril twice a day to three times a day 45 mL prn   mesalamine (PENTASA) 500 MG CR capsule TAKE 3 CAPSULES BY MOUTH TWICE DAILY 180 capsule 11   MULTIPLE VITAMIN PO Take 1 tablet by mouth daily.     NON FORMULARY as needed. CBD Cream- applies prn to muscles, joints     NONFORMULARY OR COMPOUNDED ITEM Vitamin E vaginal  suppositories 200u/ml.  One pv three times weekly. 36 each 3   omeprazole (PRILOSEC) 40 MG capsule TAKE 1 CAPSULE(40 MG) BY MOUTH TWICE DAILY 30 MINUTES BEFORE BREAKFAST AND LUNCH 180 capsule 0   OVER THE COUNTER MEDICATION in the  morning and at bedtime. Focus Select for eye vitamins and supplements.     simvastatin (ZOCOR) 40 MG tablet TAKE 1 TABLET(40 MG) BY MOUTH DAILY 90 tablet 3   valACYclovir (VALTREX) 1000 MG tablet Take 2 tablets (2,000 mg total) by mouth 2 (two) times daily as needed. 90 tablet 3   No current facility-administered medications on file prior to visit.    Allergies  Allergen Reactions   Sulfamethoxazole-Trimethoprim Hives    REACTION: rash   Amoxicillin     REACTION: unspecified   Bactrim [Sulfamethoxazole-Trimethoprim]    Penicillins Hives   Sertraline     Hair loss    Tetracaine Itching and Swelling   Topamax [Topiramate]     Hair loss   Remeron [Mirtazapine] Other (See Comments)    Increased ravenous appetite       Physical Exam Vitals requested from patient and listed below if patient had equipment and was able to obtain at home for this virtual visit: Vitals:   01/20/23 1037 01/20/23 1038  BP: (!) 147/55 (!) 117/57   Estimated body mass index is 24.75 kg/m as calculated from the following:   Height as of this encounter: 5' 5.5" (1.664 m).   Weight as of this encounter: 151 lb (68.5 kg).  EKG (optional): deferred due to virtual visit  GENERAL: alert, oriented, no acute distress detected, full vision exam deferred due to pandemic and/or virtual encounter  HEENT: atraumatic, conjunttiva clear, no obvious abnormalities on inspection of external nose and ears  NECK: normal movements of the head and neck  LUNGS: on inspection no signs of respiratory distress, breathing rate appears normal, no obvious gross SOB, gasping or wheezing  CV: no obvious cyanosis  MS: moves all visible extremities without noticeable abnormality  PSYCH/NEURO: pleasant  and cooperative, no obvious depression or anxiety, speech and thought processing grossly intact, Cognitive function grossly intact  Flowsheet Row Office Visit from 12/28/2021 in [facility] [place]  PHQ-9 Total Score 6           01/20/2023   10:39 AM 12/28/2021    9:00 AM 12/17/2021    2:24 PM 12/14/2021   11:42 AM 01/16/2021    9:26 AM  Depression screen PHQ 2/9  Decreased Interest 0 0 0 0 0  Down, Depressed, Hopeless 0 0 0 0 0  PHQ - 2 Score 0 0 0 0 0  Altered sleeping  3     Tired, decreased energy  1     Change in appetite  0     Feeling bad or failure about yourself   1     Trouble concentrating  1     Moving slowly or fidgety/restless  0     Suicidal thoughts  0     PHQ-9 Score  6     Difficult doing work/chores  Not difficult at all          12/23/2020    8:29 AM 12/09/2021   12:02 PM 12/14/2021   11:48 AM 12/28/2021    9:00 AM 01/20/2023   10:39 AM  Fall Risk  Falls in the past year? 0 1 1 1  0  Was there an injury with Fall? 0 0 0 0 0  Fall Risk Category Calculator 0 1 1 1  0  Fall Risk Category (Retired) Low Low Low Low   (RETIRED) Patient Fall Risk Level   Low fall risk Low fall risk   Patient at Risk for Falls Due to  No Fall Risks Impaired balance/gait No Fall Risks  Fall risk Follow up    Falls evaluation completed Falls evaluation completed     SUMMARY AND PLAN:  Encounter for Medicare annual wellness exam   Discussed applicable health maintenance/preventive health measures and advised and referred or ordered per patient preferences: -does bone density with gyn, normal last check and plans to just do every 5 years -doing mammogram next week  Health Maintenance  Topic Date Due   COVID-19 Vaccine (6 - 2023-24 season) 03/05/2023   Colonoscopy  10/10/2023   DTaP/Tdap/Td (3 - Tdap) 11/14/2023   Medicare Annual Wellness (AWV)  01/20/2024   Pneumonia Vaccine 74+ Years old  Completed   INFLUENZA VACCINE  Completed   DEXA SCAN   Completed   Hepatitis C Screening  Completed   Zoster Vaccines- Shingrix  Completed   HPV VACCINES  Aged Out     Education and counseling on the following was provided based on the above review of health and a plan/checklist for the patient, along with additional information discussed, was provided for the patient in the patient instructions :   -Provided safe balance exercises that can be done at home to improve balance and discussed exercise guidelines for adults with include balance exercises at least 3 days per week.  -Advised and counseled on a healthy lifestyle -Reviewed patient's current diet. Advised and counseled on a whole foods based healthy diet. A summary of a healthy diet was provided in the Patient Instructions. Goal to reduce/remove processed beverages. -reviewed patient's current physical activity level and discussed exercise guidelines for adults. Discussed community resources and ideas for safe exercise at home to assist in meeting exercise guideline recommendations in a safe and healthy way. Goal to add strength building 2x per week and balance exercise.  -Advise yearly dental visits at minimum and regular eye exams -Advised and counseled on alcohol safe limits, risks  Follow up: see patient instructions     Patient Instructions  I really enjoyed getting to talk with you today! I am available on Tuesdays and Thursdays for virtual visits if you have any questions or concerns, or if I can be of any further assistance.   CHECKLIST FROM ANNUAL WELLNESS VISIT:  -Follow up (please call to schedule if not scheduled after visit):   -yearly for annual wellness visit with primary care office  Here is a list of your preventive care/health maintenance measures and the plan for each if any are due:  PLAN For any measures below that may be due:   Health Maintenance  Topic Date Due   COVID-19 Vaccine (6 - 2023-24 season) 03/05/2023   Colonoscopy  10/10/2023   DTaP/Tdap/Td  (3 - Tdap) 11/14/2023   Medicare Annual Wellness (AWV)  01/20/2024   Pneumonia Vaccine 45+ Years old  Completed   INFLUENZA VACCINE  Completed   DEXA SCAN  Completed   Hepatitis C Screening  Completed   Zoster Vaccines- Shingrix  Completed   HPV VACCINES  Aged Out    -See a dentist at least yearly  -Get your eyes checked and then per your eye specialist's recommendations  -Other issues addressed today:   -I have included below further information regarding a healthy whole foods based diet, physical activity guidelines for adults, stress management and opportunities for social connections. I hope you find this information useful.   -----------------------------------------------------------------------------------------------------------------------------------------------------------------------------------------------------------------------------------------------------------  NUTRITION: -eat real food: lots of colorful vegetables (half the plate) and fruits -5-7 servings of vegetables and fruits per day (  fresh or steamed is best), exp. 2 servings of vegetables with lunch and dinner and 2 servings of fruit per day. Berries and greens such as kale and collards are great choices.  -consume on a regular basis: whole grains (make sure first ingredient on label contains the word "whole"), fresh fruits, fish, nuts, seeds, healthy oils (such as olive oil, avocado oil, grape seed oil) -may eat small amounts of dairy and lean meat on occasion, but avoid processed meats such as ham, bacon, lunch meat, etc. -drink water -try to avoid fast food and pre-packaged foods, processed meat -most experts advise limiting sodium to < 2300mg  per day, should limit further is any chronic conditions such as high blood pressure, heart disease, diabetes, etc. The American Heart Association advised that < 1500mg  is is ideal -try to avoid foods that contain any ingredients with names you do not recognize  -try to  avoid sugar/sweets (except for the natural sugar that occurs in fresh fruit) -try to avoid sweet drinks -try to avoid white rice, white bread, pasta (unless whole grain), white or yellow potatoes  EXERCISE GUIDELINES FOR ADULTS: -if you wish to increase your physical activity, do so gradually and with the approval of your doctor -STOP and seek medical care immediately if you have any chest pain, chest discomfort or trouble breathing when starting or increasing exercise  -move and stretch your body, legs, feet and arms when sitting for long periods -Physical activity guidelines for optimal health in adults: -least 150 minutes per week of aerobic exercise (can talk, but not sing) once approved by your doctor, 20-30 minutes of sustained activity or two 10 minute episodes of sustained activity every day.  -resistance training at least 2 days per week if approved by your doctor -balance exercises 3+ days per week:   Stand somewhere where you have something sturdy to hold onto if you lose balance.    1) lift up on toes, start with 5x per day and work up to 20x   2) stand and lift on leg straight out to the side so that foot is a few inches of the floor, start with 5x each side and work up to 20x each side   3) stand on one foot, start with 5 seconds each side and work up to 20 seconds on each side  If you need ideas or help with getting more active:  -Silver sneakers https://tools.silversneakers.com  -Walk with a Doc: http://www.duncan-williams.com/  -try to include resistance (weight lifting/strength building) and balance exercises twice per week: or the following link for ideas: http://castillo-powell.com/  BuyDucts.dk  STRESS MANAGEMENT: -can try meditating, or just sitting quietly with deep breathing while intentionally relaxing all parts of your body for 5 minutes daily -if you need further help with  stress, anxiety or depression please follow up with your primary doctor or contact the wonderful folks at [facility]: [phone]  SOCIAL CONNECTIONS: -options in [place] if you wish to engage in more social and exercise related activities:  -Silver sneakers https://tools.silversneakers.com  -Walk with a Doc: http://www.duncan-williams.com/  -Check out the [facility] Active Adults 50+ section on the [place] of [employer] (hiking clubs, book clubs, cards and games, chess, exercise classes, aquatic classes and much more) - see the website for details: https://www.[place].gov/departments/parks-recreation/active-adults50  -YouTube has lots of exercise videos for different ages and abilities as well  -[facility] (a variety of indoor and outdoor inperson activities for adults). [phone]. [address]  -Virtual Online Classes (a variety  of topics): see seniorplanet.org or call [phone]  -consider volunteering at a school, hospice center, church, senior center or elsewhere           Terressa Koyanagi, DO

## 2023-01-20 NOTE — Patient Instructions (Signed)
I really enjoyed getting to talk with you today! I am available on Tuesdays and Thursdays for virtual visits if you have any questions or concerns, or if I can be of any further assistance.   CHECKLIST FROM ANNUAL WELLNESS VISIT:  -Follow up (please call to schedule if not scheduled after visit):   -yearly for annual wellness visit with primary care office  Here is a list of your preventive care/health maintenance measures and the plan for each if any are due:  PLAN For any measures below that may be due:   Health Maintenance  Topic Date Due   COVID-19 Vaccine (6 - 2023-24 season) 03/05/2023   Colonoscopy  10/10/2023   DTaP/Tdap/Td (3 - Tdap) 11/14/2023   Medicare Annual Wellness (AWV)  01/20/2024   Pneumonia Vaccine 67+ Years old  Completed   INFLUENZA VACCINE  Completed   DEXA SCAN  Completed   Hepatitis C Screening  Completed   Zoster Vaccines- Shingrix  Completed   HPV VACCINES  Aged Out    -See a dentist at least yearly  -Get your eyes checked and then per your eye specialist's recommendations  -Other issues addressed today:   -I have included below further information regarding a healthy whole foods based diet, physical activity guidelines for adults, stress management and opportunities for social connections. I hope you find this information useful.   -----------------------------------------------------------------------------------------------------------------------------------------------------------------------------------------------------------------------------------------------------------  NUTRITION: -eat real food: lots of colorful vegetables (half the plate) and fruits -5-7 servings of vegetables and fruits per day (fresh or steamed is best), exp. 2 servings of vegetables with lunch and dinner and 2 servings of fruit per day. Berries and greens such as kale and collards are great choices.  -consume on a regular basis: whole grains (make sure first ingredient  on label contains the word "whole"), fresh fruits, fish, nuts, seeds, healthy oils (such as olive oil, avocado oil, grape seed oil) -may eat small amounts of dairy and lean meat on occasion, but avoid processed meats such as ham, bacon, lunch meat, etc. -drink water -try to avoid fast food and pre-packaged foods, processed meat -most experts advise limiting sodium to < 2300mg  per day, should limit further is any chronic conditions such as high blood pressure, heart disease, diabetes, etc. The American Heart Association advised that < 1500mg  is is ideal -try to avoid foods that contain any ingredients with names you do not recognize  -try to avoid sugar/sweets (except for the natural sugar that occurs in fresh fruit) -try to avoid sweet drinks -try to avoid white rice, white bread, pasta (unless whole grain), white or yellow potatoes  EXERCISE GUIDELINES FOR ADULTS: -if you wish to increase your physical activity, do so gradually and with the approval of your doctor -STOP and seek medical care immediately if you have any chest pain, chest discomfort or trouble breathing when starting or increasing exercise  -move and stretch your body, legs, feet and arms when sitting for long periods -Physical activity guidelines for optimal health in adults: -least 150 minutes per week of aerobic exercise (can talk, but not sing) once approved by your doctor, 20-30 minutes of sustained activity or two 10 minute episodes of sustained activity every day.  -resistance training at least 2 days per week if approved by your doctor -balance exercises 3+ days per week:   Stand somewhere where you have something sturdy to hold onto if you lose balance.    1) lift up on toes, start with 5x per day and work up to  20x   2) stand and lift on leg straight out to the side so that foot is a few inches of the floor, start with 5x each side and work up to 20x each side   3) stand on one foot, start with 5 seconds each side and  work up to 20 seconds on each side  If you need ideas or help with getting more active:  -Silver sneakers https://tools.silversneakers.com  -Walk with a Doc: http://www.duncan-williams.com/  -try to include resistance (weight lifting/strength building) and balance exercises twice per week: or the following link for ideas: http://castillo-powell.com/  BuyDucts.dk  STRESS MANAGEMENT: -can try meditating, or just sitting quietly with deep breathing while intentionally relaxing all parts of your body for 5 minutes daily -if you need further help with stress, anxiety or depression please follow up with your primary doctor or contact the wonderful folks at [facility]: [phone]  SOCIAL CONNECTIONS: -options in [place] if you wish to engage in more social and exercise related activities:  -Silver sneakers https://tools.silversneakers.com  -Walk with a Doc: http://www.duncan-williams.com/  -Check out the [facility] Active Adults 50+ section on the [place] [employer] (hiking clubs, book clubs, cards and games, chess, exercise classes, aquatic classes and much more) - see the website for details: https://www.[place].gov/departments/parks-recreation/active-adults50  -YouTube has lots of exercise videos for different ages and abilities as well  -[facility] (a variety of indoor and outdoor inperson activities for adults). [phone]) [phone]. [address].  -Virtual Online Classes (a variety of topics): see seniorplanet.org or call [phone]  -consider volunteering at a school, hospice center, church, senior center or elsewhere

## 2023-01-24 ENCOUNTER — Ambulatory Visit
Admission: RE | Admit: 2023-01-24 | Discharge: 2023-01-24 | Disposition: A | Discharge status: Home / Self Care | Payer: Medicare Other | Source: Ambulatory Visit | Attending: Obstetrics & Gynecology | Admitting: Obstetrics & Gynecology

## 2023-01-24 DIAGNOSIS — Z1231 Encounter for screening mammogram for malignant neoplasm of breast: Secondary | ICD-10-CM

## 2023-02-14 ENCOUNTER — Ambulatory Visit (HOSPITAL_BASED_OUTPATIENT_CLINIC_OR_DEPARTMENT_OTHER): Payer: Medicare Other | Admitting: Obstetrics & Gynecology

## 2023-02-14 ENCOUNTER — Encounter (HOSPITAL_BASED_OUTPATIENT_CLINIC_OR_DEPARTMENT_OTHER): Payer: Self-pay | Admitting: Obstetrics & Gynecology

## 2023-02-14 ENCOUNTER — Other Ambulatory Visit: Payer: Self-pay | Admitting: Internal Medicine

## 2023-02-14 VITALS — BP 162/60 | HR 71 | Ht 65.25 in | Wt 156.6 lb

## 2023-02-14 DIAGNOSIS — Z9189 Other specified personal risk factors, not elsewhere classified: Secondary | ICD-10-CM | POA: Diagnosis not present

## 2023-02-14 DIAGNOSIS — N393 Stress incontinence (female) (male): Secondary | ICD-10-CM

## 2023-02-14 DIAGNOSIS — B009 Herpesviral infection, unspecified: Secondary | ICD-10-CM

## 2023-02-14 DIAGNOSIS — Z780 Asymptomatic menopausal state: Secondary | ICD-10-CM

## 2023-02-14 DIAGNOSIS — Z90710 Acquired absence of both cervix and uterus: Secondary | ICD-10-CM | POA: Diagnosis not present

## 2023-02-14 NOTE — Progress Notes (Signed)
77 y.o. G2P2 Married White or Caucasian female here for breast and pelvic exam.  I am also following her for PMP status, vaginal dryness and oral HSV.  Doing well.  Denies vaginal bleeding.  Does not need valtrex RF at this time.  Blood pressure is elevated today.  She's been checking this at home and it has been normal.  She often has elevated blood pressure in the office.    Patient's last menstrual period was 04/19/1992.          Sexually active: Yes.    H/O STD:  oral HSV  Health Maintenance: PCP:  Dr. Fabian Sharp.  Appt scheduled 11/14.  Will do blood work then.    Vaccines are up to date:  yes Colonoscopy:  10/10/2018, follow up 2025 MMG:  01/24/2023 Negative BMD:  01/02/2020 Normal Last pap smear:  07/13/2019 Negative.   H/o abnormal pap smear:      reports that she has never smoked. She has never used smokeless tobacco. She reports current alcohol use of about 1.0 - 2.0 standard drink of alcohol per week. She reports that she does not use drugs.  Past Medical History:  Diagnosis Date   Allergy    Anxiety    not treated    Arthritis    knees, neck - no meds   Cataract    bilateral - MD just watchning   Colon polyps    Crohn's    Diverticulosis    Eczema    Esophageal reflux    Gastritis    Gout    Hiatal hernia    Hyperlipemia    Ileitis    Migraines    tx with naproxen - last one 4 yrs ago   Rosacea    Skin cancer of face    scca removed     Past Surgical History:  Procedure Laterality Date   BREAST BIOPSY Left 1980   CATARACT EXTRACTION, BILATERAL  01/2022   COLONOSCOPY  08/2015   Pyrtle -polyp   DILATION AND CURETTAGE OF UTERUS     POLYPECTOMY     TONSILLECTOMY     TOTAL VAGINAL HYSTERECTOMY  1994   UPPER GASTROINTESTINAL ENDOSCOPY     UPPER GI ENDOSCOPY     hiatal hernia   WISDOM TOOTH EXTRACTION      Current Outpatient Medications  Medication Sig Dispense Refill   acetaminophen (TYLENOL) 500 MG tablet Take 500 mg by mouth every 6 (six) hours as  needed.     Azelaic Acid (FINACEA) 15 % cream After skin is thoroughly washed and patted dry, gently but thoroughly massage a thin film of azelaic acid cream into the affected area twice daily, in the morning and evening. 50 g 11   citalopram (CELEXA) 20 MG tablet TAKE 1 TABLET(20 MG) BY MOUTH DAILY 90 tablet 3   ipratropium (ATROVENT) 0.06 % nasal spray instill 2 sprays into each nostril twice a day to three times a day 45 mL prn   mesalamine (PENTASA) 500 MG CR capsule TAKE 3 CAPSULES BY MOUTH TWICE DAILY 180 capsule 11   MULTIPLE VITAMIN PO Take 1 tablet by mouth daily.     omeprazole (PRILOSEC) 40 MG capsule TAKE 1 CAPSULE(40 MG) BY MOUTH TWICE DAILY 30 MINUTES BEFORE BREAKFAST AND LUNCH 180 capsule 0   OVER THE COUNTER MEDICATION in the morning and at bedtime. Focus Select for eye vitamins and supplements.     simvastatin (ZOCOR) 40 MG tablet TAKE 1 TABLET(40 MG) BY MOUTH DAILY 90  tablet 3   valACYclovir (VALTREX) 1000 MG tablet Take 2 tablets (2,000 mg total) by mouth 2 (two) times daily as needed. 90 tablet 3   NON FORMULARY as needed. CBD Cream- applies prn to muscles, joints (Patient not taking: Reported on 02/14/2023)     NONFORMULARY OR COMPOUNDED ITEM Vitamin E vaginal suppositories 200u/ml.  One pv three times weekly. (Patient not taking: Reported on 02/14/2023) 36 each 3   No current facility-administered medications for this visit.    Family History  Problem Relation Age of Onset   Parkinsonism Mother    Lung cancer Mother    Heart disease Father        died chf in 73 s   Hypertension Father    Congestive Heart Failure Father    Colon polyps Sister    Breast cancer Daughter    Breast cancer Paternal Aunt    Breast cancer Cousin    Colon cancer Neg Hx    Esophageal cancer Neg Hx    Rectal cancer Neg Hx    Stomach cancer Neg Hx     Review of Systems  Constitutional: Negative.   Genitourinary: Negative.     Exam:   BP (!) 162/60 (BP Location: Right Arm, Patient  Position: Sitting, Cuff Size: Large)   Pulse 71   Ht 5' 5.25" (1.657 m)   Wt 156 lb 9.6 oz (71 kg)   LMP 04/19/1992   BMI 25.86 kg/m   Height: 5' 5.25" (165.7 cm)  General appearance: alert, cooperative and appears stated age Breasts: normal appearance, no masses or tenderness Abdomen: soft, non-tender; bowel sounds normal; no masses,  no organomegaly Lymph nodes: Cervical, supraclavicular, and axillary nodes normal.  No abnormal inguinal nodes palpated Neurologic: Grossly normal  Pelvic: External genitalia:  no lesions              Urethra:  normal appearing urethra with no masses, tenderness or lesions              Bartholins and Skenes: normal                 Vagina: normal appearing vagina with atrophic changes and no discharge, no lesions              Cervix: absent              Pap taken: No. Bimanual Exam:  Uterus:  uterus absent              Adnexa: no mass, fullness, tenderness               Rectovaginal: Confirms               Anus:  normal sphincter tone, no lesions  Chaperone, Ina Homes, CMA, was present for exam.  Assessment/Plan: 1. GYN exam for high-risk Medicare patient - Pap smear mot indicated - Mammogram 01/2023 - Colonoscopy 2020, follow up next year - Bone mineral density 12/2019 - lab work done with PCP, Dr. Fabian Sharp - vaccines reviewed/updated  2. H/O: hysterectomy  3. Postmenopausal vaginal atrophy  - OTC options discussed with pt.  She has used Vit E compounded prescriptions but does not need this right now.  4. SUI (stress urinary incontinence, female) - referral to urogyn was made last year.  She just will let me know when she is ready for referral and will also refer to PT at the same time   5. HSV-1 (herpes simplex virus 1) infection - does not need  valtrex refill at this time

## 2023-02-14 NOTE — Patient Instructions (Signed)
Non hormonal  Coconut oil twice weekly Replens vaginal moisturizer twice weekly Revaree - vaginal hyaluronic acid suppository twice weekly Vit E vaginal suppositories   Hormonal: Estradiol cream -- twice weekly (prescription)

## 2023-02-18 ENCOUNTER — Other Ambulatory Visit: Payer: Self-pay | Admitting: Nurse Practitioner

## 2023-02-24 ENCOUNTER — Other Ambulatory Visit: Payer: Self-pay | Admitting: Nurse Practitioner

## 2023-03-02 NOTE — Progress Notes (Unsigned)
No chief complaint on file.   HPI: Patient  Bridget Reyes  77 y.o. comes in today for Preventive Health Care visit   Health Maintenance  Topic Date Due   COVID-19 Vaccine (6 - 2023-24 season) 03/05/2023   Colonoscopy  10/10/2023   DTaP/Tdap/Td (3 - Tdap) 11/14/2023   Medicare Annual Wellness (AWV)  01/20/2024   Pneumonia Vaccine 48+ Years old  Completed   INFLUENZA VACCINE  Completed   DEXA SCAN  Completed   Hepatitis C Screening  Completed   Zoster Vaccines- Shingrix  Completed   HPV VACCINES  Aged Out   Health Maintenance Review LIFESTYLE:  Exercise:   Tobacco/ETS: Alcohol:  Sugar beverages: Sleep: Drug use: no HH of  Work:  Sees gyne dr Hyacinth Meeker   ROS:  GEN/ HEENT: No fever, significant weight changes sweats headaches vision problems hearing changes, CV/ PULM; No chest pain shortness of breath cough, syncope,edema  change in exercise tolerance. GI /GU: No adominal pain, vomiting, change in bowel habits. No blood in the stool. No significant GU symptoms. SKIN/HEME: ,no acute skin rashes suspicious lesions or bleeding. No lymphadenopathy, nodules, masses.  NEURO/ PSYCH:  No neurologic signs such as weakness numbness. No depression anxiety. IMM/ Allergy: No unusual infections.  Allergy .   REST of 12 system review negative except as per HPI   Past Medical History:  Diagnosis Date   Allergy    Anxiety    not treated    Arthritis    knees, neck - no meds   Cataract    bilateral - MD just watchning   Colon polyps    Crohn's    Diverticulosis    Eczema    Esophageal reflux    Gastritis    Gout    Hiatal hernia    Hyperlipemia    Ileitis    Migraines    tx with naproxen - last one 4 yrs ago   Rosacea    Skin cancer of face    scca removed     Past Surgical History:  Procedure Laterality Date   BREAST BIOPSY Left 1980   CATARACT EXTRACTION, BILATERAL  01/2022   COLONOSCOPY  08/2015   Pyrtle -polyp   DILATION AND CURETTAGE OF UTERUS      POLYPECTOMY     TONSILLECTOMY     TOTAL VAGINAL HYSTERECTOMY  1994   UPPER GASTROINTESTINAL ENDOSCOPY     UPPER GI ENDOSCOPY     hiatal hernia   WISDOM TOOTH EXTRACTION      Family History  Problem Relation Age of Onset   Parkinsonism Mother    Lung cancer Mother    Heart disease Father        died chf in 5 s   Hypertension Father    Congestive Heart Failure Father    Colon polyps Sister    Breast cancer Daughter    Breast cancer Paternal Aunt    Breast cancer Cousin    Colon cancer Neg Hx    Esophageal cancer Neg Hx    Rectal cancer Neg Hx    Stomach cancer Neg Hx     Social History   Socioeconomic History   Marital status: Married    Spouse name: Not on file   Number of children: 2   Years of education: Not on file   Highest education level: Bachelor's degree (e.g., BA, AB, BS)  Occupational History   Occupation: retired     Comment: Advertising account executive:  UNEMPLOYED  Tobacco Use   Smoking status: Never   Smokeless tobacco: Never  Vaping Use   Vaping status: Never Used  Substance and Sexual Activity   Alcohol use: Yes    Alcohol/week: 1.0 - 2.0 standard drink of alcohol    Types: 1 - 2 Glasses of wine per week   Drug use: No   Sexual activity: Yes    Partners: Male    Birth control/protection: Surgical    Comment: hysterectomy  Other Topics Concern   Not on file  Social History Narrative   hh  Of 2       Dog    Married    G2P2   Social Determinants of Health   Financial Resource Strain: Low Risk  (03/02/2023)   Overall Financial Resource Strain (CARDIA)    Difficulty of Paying Living Expenses: Not hard at all  Food Insecurity: No Food Insecurity (03/02/2023)   Hunger Vital Sign    Worried About Running Out of Food in the Last Year: Never true    Ran Out of Food in the Last Year: Never true  Transportation Needs: No Transportation Needs (03/02/2023)   PRAPARE - Administrator, Civil Service (Medical): No    Lack of  Transportation (Non-Medical): No  Physical Activity: Unknown (03/02/2023)   Exercise Vital Sign    Days of Exercise per Week: Patient declined    Minutes of Exercise per Session: Not on file  Stress: Stress Concern Present (03/02/2023)   [employer] of Occupational Health - Occupational Stress Questionnaire    Feeling of Stress : To some extent  Social Connections: Socially Integrated (03/02/2023)   Social Connection and Isolation Panel [NHANES]    Frequency of Communication with Friends and Family: More than three times a week    Frequency of Social Gatherings with Friends and Family: Once a week    Attends Religious Services: More than 4 times per year    Active Member of [employer] or Organizations: Yes    Attends Engineer, structural: More than 4 times per year    Marital Status: Married    Outpatient Medications Prior to Visit  Medication Sig Dispense Refill   acetaminophen (TYLENOL) 500 MG tablet Take 500 mg by mouth every 6 (six) hours as needed.     Azelaic Acid (FINACEA) 15 % cream After skin is thoroughly washed and patted dry, gently but thoroughly massage a thin film of azelaic acid cream into the affected area twice daily, in the morning and evening. 50 g 11   citalopram (CELEXA) 20 MG tablet TAKE 1 TABLET(20 MG) BY MOUTH DAILY 90 tablet 3   ipratropium (ATROVENT) 0.06 % nasal spray USE 2 SPRAYS IN EACH NOSTRIL TWICE TO THREE TIMES DAILY. 45 mL prn   ipratropium (ATROVENT) 0.06 % nasal spray USE 2 SPRAYS IN EACH NOSTRIL TWICE TO THREE TIMES DAILY. 45 mL prn   mesalamine (PENTASA) 500 MG CR capsule TAKE 3 CAPSULES BY MOUTH TWICE DAILY 180 capsule 11   MULTIPLE VITAMIN PO Take 1 tablet by mouth daily.     NON FORMULARY as needed. CBD Cream- applies prn to muscles, joints (Patient not taking: Reported on 02/14/2023)     NONFORMULARY OR COMPOUNDED ITEM Vitamin E vaginal suppositories 200u/ml.  One pv three times weekly. (Patient not taking: Reported on 02/14/2023) 36  each 3   omeprazole (PRILOSEC) 40 MG capsule TAKE 1 CAPSULE(40 MG) BY MOUTH TWICE DAILY 30 MINUTES BEFORE BREAKFAST AND LUNCH 180 capsule  0   OVER THE COUNTER MEDICATION in the morning and at bedtime. Focus Select for eye vitamins and supplements.     simvastatin (ZOCOR) 40 MG tablet TAKE 1 TABLET(40 MG) BY MOUTH DAILY 90 tablet 3   valACYclovir (VALTREX) 1000 MG tablet Take 2 tablets (2,000 mg total) by mouth 2 (two) times daily as needed. 90 tablet 3   No facility-administered medications prior to visit.     EXAM:  LMP 04/19/1992   There is no height or weight on file to calculate BMI. Wt Readings from Last 3 Encounters:  02/14/23 156 lb 9.6 oz (71 kg)  01/20/23 151 lb (68.5 kg)  05/26/22 148 lb 12.8 oz (67.5 kg)    Physical Exam: Vital signs reviewed ZOX:WRUE is a well-developed well-nourished alert cooperative    who appearsr stated age in no acute distress.  HEENT: normocephalic atraumatic , Eyes: PERRL EOM's full, conjunctiva clear, Nares: paten,t no deformity discharge or tenderness., Ears: no deformity EAC's clear TMs with normal landmarks. Mouth: clear OP, no lesions, edema.  Moist mucous membranes. Dentition in adequate repair. NECK: supple without masses, thyromegaly or bruits. CHEST/PULM:  Clear to auscultation and percussion breath sounds equal no wheeze , rales or rhonchi. No chest wall deformities or tenderness. Breast: normal by inspection . No dimpling, discharge, masses, tenderness or discharge . CV: PMI is nondisplaced, S1 S2 no gallops, murmurs, rubs. Peripheral pulses are full without delay.No JVD .  ABDOMEN: Bowel sounds normal nontender  No guard or rebound, no hepato splenomegal no CVA tenderness.  No hernia. Extremtities:  No clubbing cyanosis or edema, no acute joint swelling or redness no focal atrophy NEURO:  Oriented x3, cranial nerves 3-12 appear to be intact, no obvious focal weakness,gait within normal limits no abnormal reflexes or asymmetrical SKIN:  No acute rashes normal turgor, color, no bruising or petechiae. PSYCH: Oriented, good eye contact, no obvious depression anxiety, cognition and judgment appear normal. LN: no cervical axillary inguinal adenopathy  Lab Results  Component Value Date   WBC 5.1 12/28/2021   HGB 13.9 12/28/2021   HCT 40.5 12/28/2021   PLT 235.0 12/28/2021   GLUCOSE 82 12/28/2021   CHOL 146 12/28/2021   TRIG 96.0 12/28/2021   HDL 47.50 12/28/2021   LDLCALC 80 12/28/2021   ALT 19 12/28/2021   AST 22 12/28/2021   NA 137 12/28/2021   K 4.2 12/28/2021   CL 100 12/28/2021   CREATININE 0.84 12/28/2021   BUN 13 12/28/2021   CO2 32 12/28/2021   TSH 5.40 12/28/2021   HGBA1C 5.3 12/21/2019    BP Readings from Last 3 Encounters:  02/14/23 (!) 162/60  01/20/23 (!) 117/57  05/26/22 126/62    Lab rplan reviewed with patient   ASSESSMENT AND PLAN:  Discussed the following assessment and plan:    ICD-10-CM   1. Crohn's disease of both small and large intestine without complication (HCC)  K50.80     2. Visit for preventive health examination  Z00.00     3. Medication management  Z79.899     4. Hyperlipidemia, unspecified hyperlipidemia type  E78.5     5. Hearing aid worn  Z97.4      No follow-ups on file.  Patient Care Team: Aalaiyah Yassin, Neta Mends, MD as PCP - General Delfin Gant, MD (Family Medicine) [place], Amy, MD (Dermatology) Jerene Bears, MD (Gynecology) Santiago Glad, MD as Referring Physician (Specialist) Janet Berlin, MD (Ophthalmology) Osborn Coho, MD (Inactive) as Consulting Physician (Otolaryngology) Blima Ledger, OD (  Optometry) Pyrtle, Carie Caddy, MD as Consulting Physician (Gastroenterology) There are no Patient Instructions on file for this visit.  Neta Mends. Julious Langlois M.D.

## 2023-03-03 ENCOUNTER — Encounter: Payer: Self-pay | Admitting: Internal Medicine

## 2023-03-03 ENCOUNTER — Ambulatory Visit: Payer: Medicare Other | Admitting: Internal Medicine

## 2023-03-03 VITALS — BP 157/64 | HR 62 | Temp 98.1°F | Ht 65.1 in | Wt 157.2 lb

## 2023-03-03 DIAGNOSIS — Z0000 Encounter for general adult medical examination without abnormal findings: Secondary | ICD-10-CM | POA: Diagnosis not present

## 2023-03-03 DIAGNOSIS — E785 Hyperlipidemia, unspecified: Secondary | ICD-10-CM

## 2023-03-03 DIAGNOSIS — Z8739 Personal history of other diseases of the musculoskeletal system and connective tissue: Secondary | ICD-10-CM | POA: Diagnosis not present

## 2023-03-03 DIAGNOSIS — K5000 Crohn's disease of small intestine without complications: Secondary | ICD-10-CM

## 2023-03-03 DIAGNOSIS — R030 Elevated blood-pressure reading, without diagnosis of hypertension: Secondary | ICD-10-CM | POA: Diagnosis not present

## 2023-03-03 DIAGNOSIS — Z974 Presence of external hearing-aid: Secondary | ICD-10-CM | POA: Diagnosis not present

## 2023-03-03 DIAGNOSIS — K5080 Crohn's disease of both small and large intestine without complications: Secondary | ICD-10-CM | POA: Diagnosis not present

## 2023-03-03 DIAGNOSIS — R6889 Other general symptoms and signs: Secondary | ICD-10-CM

## 2023-03-03 DIAGNOSIS — Z79899 Other long term (current) drug therapy: Secondary | ICD-10-CM | POA: Diagnosis not present

## 2023-03-03 DIAGNOSIS — G479 Sleep disorder, unspecified: Secondary | ICD-10-CM

## 2023-03-03 LAB — CBC WITH DIFFERENTIAL/PLATELET
Basophils Absolute: 0.1 10*3/uL (ref 0.0–0.1)
Basophils Relative: 1 % (ref 0.0–3.0)
Eosinophils Absolute: 0.4 10*3/uL (ref 0.0–0.7)
Eosinophils Relative: 6.8 % — ABNORMAL HIGH (ref 0.0–5.0)
HCT: 42.1 % (ref 36.0–46.0)
Hemoglobin: 14.2 g/dL (ref 12.0–15.0)
Lymphocytes Relative: 22.2 % (ref 12.0–46.0)
Lymphs Abs: 1.4 10*3/uL (ref 0.7–4.0)
MCHC: 33.8 g/dL (ref 30.0–36.0)
MCV: 95.1 fL (ref 78.0–100.0)
Monocytes Absolute: 0.6 10*3/uL (ref 0.1–1.0)
Monocytes Relative: 9.8 % (ref 3.0–12.0)
Neutro Abs: 3.9 10*3/uL (ref 1.4–7.7)
Neutrophils Relative %: 60.2 % (ref 43.0–77.0)
Platelets: 239 10*3/uL (ref 150.0–400.0)
RBC: 4.43 Mil/uL (ref 3.87–5.11)
RDW: 12.7 % (ref 11.5–15.5)
WBC: 6.5 10*3/uL (ref 4.0–10.5)

## 2023-03-03 LAB — COMPREHENSIVE METABOLIC PANEL
ALT: 19 U/L (ref 0–35)
AST: 22 U/L (ref 0–37)
Albumin: 4.7 g/dL (ref 3.5–5.2)
Alkaline Phosphatase: 76 U/L (ref 39–117)
BUN: 18 mg/dL (ref 6–23)
CO2: 32 meq/L (ref 19–32)
Calcium: 9.6 mg/dL (ref 8.4–10.5)
Chloride: 99 meq/L (ref 96–112)
Creatinine, Ser: 0.78 mg/dL (ref 0.40–1.20)
GFR: 73.01 mL/min (ref >60.00)
Glucose, Bld: 93 mg/dL (ref 70–99)
Potassium: 3.6 meq/L (ref 3.5–5.1)
Sodium: 136 meq/L (ref 135–145)
Total Bilirubin: 0.5 mg/dL (ref 0.2–1.2)
Total Protein: 7.7 g/dL (ref 6.0–8.3)

## 2023-03-03 LAB — HEMOGLOBIN A1C: Hgb A1c MFr Bld: 5.7 % (ref 4.6–6.5)

## 2023-03-03 LAB — LIPID PANEL
Cholesterol: 186 mg/dL (ref 0–200)
HDL: 45.6 mg/dL (ref >39.00)
LDL Cholesterol: 113 mg/dL — ABNORMAL HIGH (ref 0–99)
NonHDL: 140.32
Total CHOL/HDL Ratio: 4
Triglycerides: 139 mg/dL (ref 0.0–149.0)
VLDL: 27.8 mg/dL (ref 0.0–40.0)

## 2023-03-03 LAB — TSH: TSH: 4.36 u[IU]/mL (ref 0.35–5.50)

## 2023-03-03 LAB — VITAMIN B12: Vitamin B-12: 579 pg/mL (ref 211–911)

## 2023-03-03 LAB — URIC ACID: Uric Acid, Serum: 3.2 mg/dL (ref 2.4–7.0)

## 2023-03-03 MED ORDER — CITALOPRAM HYDROBROMIDE 40 MG PO TABS: 40.0000 mg | ORAL_TABLET | Freq: Every day | ORAL | 1 refills | Status: DC

## 2023-03-03 NOTE — Patient Instructions (Addendum)
Good to see you today  Try to get  some gentle walking  exercise  to help  regulation.  Have pharmacy contact us  when needed refill.   Try increase to 20 mg twice  a day . Citalopram   Will send in new rx for 40 mg per day . ( Can split 20 twice a day or take all at once) Plan virtual fu visit in about a month or as needed. Can see ent or even   allergy with hx of itching throat.  See ophthalmology if considering that you could have macular degeneration.

## 2023-03-07 NOTE — Progress Notes (Signed)
Results are in range except ldl is slightly up but no severe. Tsh and  uric acid  are in range  no diabetes

## 2023-04-06 NOTE — Progress Notes (Unsigned)
Virtual Visit via Video Note  I connected with Bridget Reyes on 04/07/23 at 10:00 AM EST by a video enabled telemedicine application and verified that I am speaking with the correct person using two identifiers. Location patient: home Location provider:work office Persons participating in the virtual visit: patient, provider  Patient aware  of the limitations of evaluation and management by telemedicine and  availability of in person appointments. and agreed to proceed.   HPI: Bridget Reyes presents for video visit fu after inc citalopram to 40 per day  Not much difference either way still sleep issues ( still in temp housing with move but no change)   Sleep has always been an issue even before on citalopram  had se of  remeron remote hx oif tryptophan m,ay be help when younger and had migraines  Hasn't used trazodone. Bp  up and down better this am  ROS: See pertinent positives and negatives per HPI.  Past Medical History:  Diagnosis Date   Allergy    Anxiety    not treated    Arthritis    knees, neck - no meds   Cataract    bilateral - MD just watchning   Colon polyps    Crohn's    Diverticulosis    Eczema    Esophageal reflux    Gastritis    Gout    Hiatal hernia    Hyperlipemia    Ileitis    Migraines    tx with naproxen - last one 4 yrs ago   Rosacea    Skin cancer of face    scca removed     Past Surgical History:  Procedure Laterality Date   BREAST BIOPSY Left 1980   CATARACT EXTRACTION, BILATERAL  01/2022   COLONOSCOPY  08/2015   Pyrtle -polyp   DILATION AND CURETTAGE OF UTERUS     POLYPECTOMY     TONSILLECTOMY     TOTAL VAGINAL HYSTERECTOMY  1994   UPPER GASTROINTESTINAL ENDOSCOPY     UPPER GI ENDOSCOPY     hiatal hernia   WISDOM TOOTH EXTRACTION      Family History  Problem Relation Age of Onset   Parkinsonism Mother    Lung cancer Mother    Heart disease Father        died chf in 42 s   Hypertension Father    Congestive Heart  Failure Father    Colon polyps Sister    Breast cancer Daughter    Breast cancer Paternal Aunt    Breast cancer Cousin    Colon cancer Neg Hx    Esophageal cancer Neg Hx    Rectal cancer Neg Hx    Stomach cancer Neg Hx     Social History   Tobacco Use   Smoking status: Never   Smokeless tobacco: Never  Vaping Use   Vaping status: Never Used  Substance Use Topics   Alcohol use: Yes    Alcohol/week: 1.0 - 2.0 standard drink of alcohol    Types: 1 - 2 Glasses of wine per week   Drug use: No      Current Outpatient Medications:    acetaminophen (TYLENOL) 500 MG tablet, Take 500 mg by mouth every 6 (six) hours as needed., Disp: , Rfl:    Azelaic Acid (FINACEA) 15 % cream, After skin is thoroughly washed and patted dry, gently but thoroughly massage a thin film of azelaic acid cream into the affected area twice daily, in the morning and  evening., Disp: 50 g, Rfl: 11   ipratropium (ATROVENT) 0.06 % nasal spray, USE 2 SPRAYS IN EACH NOSTRIL TWICE TO THREE TIMES DAILY., Disp: 45 mL, Rfl: prn   mesalamine (PENTASA) 500 MG CR capsule, TAKE 3 CAPSULES BY MOUTH TWICE DAILY, Disp: 180 capsule, Rfl: 11   MULTIPLE VITAMIN PO, Take 1 tablet by mouth daily., Disp: , Rfl:    NON FORMULARY, as needed. CBD Cream- applies prn to muscles, joints, Disp: , Rfl:    OVER THE COUNTER MEDICATION, in the morning and at bedtime. Focus Select for eye vitamins and supplements., Disp: , Rfl:    simvastatin (ZOCOR) 40 MG tablet, TAKE 1 TABLET(40 MG) BY MOUTH DAILY, Disp: 90 tablet, Rfl: 3   traZODone (DESYREL) 50 MG tablet, Take 0.5-1 tablets (25-50 mg total) by mouth at bedtime as needed for sleep., Disp: 30 tablet, Rfl: 1   valACYclovir (VALTREX) 1000 MG tablet, Take 2 tablets (2,000 mg total) by mouth 2 (two) times daily as needed., Disp: 90 tablet, Rfl: 3   citalopram (CELEXA) 40 MG tablet, Take 0.5 tablets (20 mg total) by mouth daily. Dosage change, Disp: 90 tablet, Rfl: 1  EXAM: BP Readings from Last 3  Encounters:  04/07/23 138/66  03/03/23 (!) 157/64  02/14/23 (!) 162/60    VITALS per patient if applicable:  GENERAL: alert, oriented, appears well and in no acute distress  HEENT: atraumatic, conjunttiva clear, no obvious abnormalities on inspection of external nose and ears  NECK: normal movements of the head and neck  LUNGS: on inspection no signs of respiratory distress, breathing rate appears normal, no obvious gross SOB, gasping or wheezing  CV: no obvious cyanosis  MS: moves all visible extremities without noticeable abnormality  PSYCH/NEURO: pleasant and cooperative, no obvious depression or anxiety, speech and thought processing grossly intact Lab Results  Component Value Date   WBC 6.5 03/03/2023   HGB 14.2 03/03/2023   HCT 42.1 03/03/2023   PLT 239.0 03/03/2023   GLUCOSE 93 03/03/2023   CHOL 186 03/03/2023   TRIG 139.0 03/03/2023   HDL 45.60 03/03/2023   LDLCALC 113 (H) 03/03/2023   ALT 19 03/03/2023   AST 22 03/03/2023   NA 136 03/03/2023   K 3.6 03/03/2023   CL 99 03/03/2023   CREATININE 0.78 03/03/2023   BUN 18 03/03/2023   CO2 32 03/03/2023   TSH 4.36 03/03/2023   HGBA1C 5.7 03/03/2023    ASSESSMENT AND PLAN:  Discussed the following assessment and plan:    ICD-10-CM   1. Disturbance in sleep behavior  G47.9     2. Medication management  Z79.899     3. Elevated blood pressure reading  R03.0     Wean citalopram back to 20 mg per day  Then trial adding trazodone 25- 50 hx for 1-2 weeks to see if helpful nd can use prn. Send in message in Jan about how doing and to make plan Since sleep has been an issue for years  may consider  sleep a ids  zolpidem  others dpending on risk benefit    Counseled. ? About what  to do if gets covid infection  never had but husband has had .  She has crohn's autoimmune condition  Advised make appt to discuss if gets covid concerning infection  Expectant management and discussion of plan and treatment with  opportunity to ask questions and all were answered. The patient agreed with the plan and demonstrated an understanding of the instructions.   Advised  to call back or seek an in-person evaluation if worsening  or having  further concerns  in interim. Return for message in Jan about how doing.    Berniece Andreas, MD

## 2023-04-07 ENCOUNTER — Encounter: Payer: Self-pay | Admitting: Internal Medicine

## 2023-04-07 ENCOUNTER — Telehealth: Payer: Medicare Other | Admitting: Internal Medicine

## 2023-04-07 VITALS — BP 138/66 | HR 61

## 2023-04-07 DIAGNOSIS — Z79899 Other long term (current) drug therapy: Secondary | ICD-10-CM | POA: Diagnosis not present

## 2023-04-07 DIAGNOSIS — R030 Elevated blood-pressure reading, without diagnosis of hypertension: Secondary | ICD-10-CM | POA: Diagnosis not present

## 2023-04-07 DIAGNOSIS — G479 Sleep disorder, unspecified: Secondary | ICD-10-CM

## 2023-04-07 MED ORDER — TRAZODONE HCL 50 MG PO TABS: 25.0000 mg | ORAL_TABLET | Freq: Every evening | ORAL | 1 refills | Status: DC | PRN

## 2023-04-07 MED ORDER — CITALOPRAM HYDROBROMIDE 40 MG PO TABS: 20.0000 mg | ORAL_TABLET | Freq: Every day | ORAL | 1 refills | Status: DC

## 2023-05-08 ENCOUNTER — Other Ambulatory Visit: Payer: Self-pay | Admitting: Internal Medicine

## 2023-05-09 ENCOUNTER — Telehealth: Payer: Self-pay | Admitting: Nurse Practitioner

## 2023-05-09 NOTE — Telephone Encounter (Signed)
Patient requesting to speak with a nurse in regards to yearly f/u apt. Offered to schedule p[patient with a PA since provider is booked, patient declined due to seeing a PA previously. Please advise.   Thank you

## 2023-05-09 NOTE — Telephone Encounter (Signed)
Pt was contacted in regard to message below. Pt was notified that Dr. Rhea Belton schedule was full.Pt was scheduled to see Alcide Evener NP on 06/21/2023 at 9:00 AM. Pt made aware.  Pt verbalized understanding with all questions answered.

## 2023-06-21 ENCOUNTER — Encounter: Payer: Self-pay | Admitting: Nurse Practitioner

## 2023-06-21 ENCOUNTER — Ambulatory Visit: Payer: Medicare Other | Admitting: Nurse Practitioner

## 2023-06-21 VITALS — BP 134/58 | HR 71 | Ht 65.1 in | Wt 159.8 lb

## 2023-06-21 DIAGNOSIS — Z8719 Personal history of other diseases of the digestive system: Secondary | ICD-10-CM

## 2023-06-21 DIAGNOSIS — K219 Gastro-esophageal reflux disease without esophagitis: Secondary | ICD-10-CM

## 2023-06-21 DIAGNOSIS — K5000 Crohn's disease of small intestine without complications: Secondary | ICD-10-CM | POA: Diagnosis not present

## 2023-06-21 DIAGNOSIS — K50919 Crohn's disease, unspecified, with unspecified complications: Secondary | ICD-10-CM

## 2023-06-21 DIAGNOSIS — Z860101 Personal history of adenomatous and serrated colon polyps: Secondary | ICD-10-CM | POA: Diagnosis not present

## 2023-06-21 DIAGNOSIS — Z860100 Personal history of colon polyps, unspecified: Secondary | ICD-10-CM

## 2023-06-21 MED ORDER — OMEPRAZOLE 40 MG PO CPDR: 40.0000 mg | DELAYED_RELEASE_CAPSULE | Freq: Every day | ORAL | 3 refills | Status: DC

## 2023-06-21 MED ORDER — MESALAMINE ER 500 MG PO CPCR: ORAL_CAPSULE | ORAL | 11 refills | Status: DC

## 2023-06-21 NOTE — Progress Notes (Signed)
 06/21/2023 Bridget Reyes [id number] [birth year]   Chief Complaint: Follow-up Crohn's disease, GERD  History of Present Illness: Bridget Reyes is a 78 year old female with a past medical history of migraine headaches, hyperlipidemia, GERD, hiatal hernia, diverticulosis, sessile serrated colon polyp and ileal Crohn's disease. She had uncomplicated Covid infection 12/20254. She is known by Dr. Rhea Belton. She presents today for her annual follow up regarding Crohn's disease and GERD. She denies having any N/V. No abdominal pain. She is passing a formed or loose mud like stool once or twice daily. No bloody or black stools. No mucous per the rectum. She remains on Mesalamine CR 500 mg 3 capsules twice daily. Her most recent colonoscopy was 10/10/2018 which showed a few erosions in the terminal ileum consistent with very mild ileal Crohn's disease, diverticulosis in the sigmoid colon and no polyps. Prior colonoscopy 08/2015 identified one 10 mm sessile serrated/adenoma polyp removed from the ascending colon. GERD symptoms are well-controlled on Omeprazole 40 mg daily. She stated her prior throat itchiness symptoms significantly improved after she stopped eating grapes. EGD 08/2015 was normal.  Appetite is good. She stated feeling hungry all of the time. She endorsed gaining about 10 pounds over the past year. Stress level is elevated as she and her husband sold her home and are temporarily living in an apartment as her new townhome is being renovated.  Patient questions if she is a candidate for Ozempic. No NSAIDs use.      Latest Ref Rng & Units 03/03/2023   11:28 AM 12/28/2021    9:52 AM 12/23/2020    9:11 AM  CBC  WBC 4.0 - 10.5 K/uL 6.5  5.1  5.6   Hemoglobin 12.0 - 15.0 g/dL 78.2  95.6  21.3   Hematocrit 36.0 - 46.0 % 42.1  40.5  40.0   Platelets 150.0 - 400.0 K/uL 239.0  235.0  238.0          Latest Ref Rng & Units 03/03/2023   11:28 AM 12/28/2021    9:52 AM 12/23/2020    9:11 AM  CMP  Glucose 70 -  99 mg/dL 93  82  98   BUN 6 - 23 mg/dL 18  13  19    Creatinine 0.40 - 1.20 mg/dL 0.86  5.78  4.69   Sodium 135 - 145 mEq/L 136  137  135   Potassium 3.5 - 5.1 mEq/L 3.6  4.2  4.2   Chloride 96 - 112 mEq/L 99  100  99   CO2 19 - 32 mEq/L 32  32  29   Calcium 8.4 - 10.5 mg/dL 9.6  9.7  9.7   Total Protein 6.0 - 8.3 g/dL 7.7  7.4  7.6   Total Bilirubin 0.2 - 1.2 mg/dL 0.5  0.4  0.5   Alkaline Phos 39 - 117 U/L 76  81  78   AST 0 - 37 U/L 22  22  21    ALT 0 - 35 U/L 19  19  21      MOST RECENT GI PROCEDURES:  Colonoscopy 10/10/2018: - A few erosions in the terminal ileum consistent with very mild ileal Crohn's disease.  - Diverticulosis in the sigmoid colon.  - The examination was otherwise normal on direct and retroflexion views.  - No specimens collected.   EGD 11/06/2015: Normal esophagus, stomach and duodenum   Colonoscopy 08/18/2015: - The examined portion of the ileum was normal.  - One 10 mm polyp in the  ascending colon, removed with a cold snare. Resected and retrieved.  - The examination was otherwise normal on direct and retroflexion views SESSILE SERRATED POLYP/ADENOMA. - NO DYSPLASIA OR MALIGNANCY.   Past Medical History:  Diagnosis Date   Allergy    Anxiety    not treated    Arthritis    knees, neck - no meds   Cataract    bilateral - MD just watchning   Colon polyps    Crohn's    Diverticulosis    Eczema    Esophageal reflux    Gastritis    Gout    Hiatal hernia    Hyperlipemia    Ileitis    Migraines    tx with naproxen - last one 4 yrs ago   Rosacea    Skin cancer of face    scca removed    Past Surgical History:  Procedure Laterality Date   BREAST BIOPSY Left 1980   CATARACT EXTRACTION, BILATERAL  01/2022   COLONOSCOPY  08/2015   Pyrtle -polyp   DILATION AND CURETTAGE OF UTERUS     POLYPECTOMY     TONSILLECTOMY     TOTAL VAGINAL HYSTERECTOMY  1994   UPPER GASTROINTESTINAL ENDOSCOPY     UPPER GI ENDOSCOPY     hiatal hernia   WISDOM TOOTH  EXTRACTION       Current Outpatient Medications on File Prior to Visit  Medication Sig Dispense Refill   acetaminophen (TYLENOL) 500 MG tablet Take 500 mg by mouth every 6 (six) hours as needed.     Cetirizine HCl (ZYRTEC PO) Take 1 tablet by mouth daily.     citalopram (CELEXA) 40 MG tablet Take 0.5 tablets (20 mg total) by mouth daily. Dosage change 90 tablet 1   fluticasone (FLONASE) 50 MCG/ACT nasal spray Place 2 sprays into both nostrils as needed.     ipratropium (ATROVENT) 0.06 % nasal spray USE 2 SPRAYS IN EACH NOSTRIL TWICE TO THREE TIMES DAILY. 45 mL prn   MULTIPLE VITAMIN PO Take 1 tablet by mouth daily.     Multiple Vitamins-Minerals (PRESERVISION AREDS 2 PO) Take 1 tablet by mouth in the morning and at bedtime.     NON FORMULARY as needed. CBD Cream- applies prn to muscles, joints     omeprazole (PRILOSEC) 40 MG capsule Take 40 mg by mouth daily.     OVER THE COUNTER MEDICATION in the morning and at bedtime. Focus Select for eye vitamins and supplements.     simvastatin (ZOCOR) 40 MG tablet TAKE 1 TABLET(40 MG) BY MOUTH DAILY 90 tablet 3   traZODone (DESYREL) 50 MG tablet TAKE 1/2 TO 1 TABLET(25 TO 50 MG) BY MOUTH AT BEDTIME AS NEEDED FOR SLEEP 30 tablet 1   valACYclovir (VALTREX) 1000 MG tablet Take 2 tablets (2,000 mg total) by mouth 2 (two) times daily as needed. 90 tablet 3   No current facility-administered medications on file prior to visit.   Allergies  Allergen Reactions   Sulfamethoxazole-Trimethoprim Hives    REACTION: rash   Amoxicillin     REACTION: unspecified   Bactrim [Sulfamethoxazole-Trimethoprim]    Penicillins Hives   Sertraline     Hair loss    Tetracaine Itching and Swelling   Topamax [Topiramate]     Hair loss   Remeron [Mirtazapine] Other (See Comments)    Increased ravenous appetite   Current Medications, Allergies, Past Medical History, Past Surgical History, Family History and Social History were reviewed in [employer]  medical record.  Review of Systems:   Constitutional: Negative for fever, sweats, chills or weight loss.  Respiratory: Negative for shortness of breath.   Cardiovascular: Negative for chest pain, palpitations and leg swelling.  Gastrointestinal: See HPI.  Musculoskeletal: Negative for back pain or muscle aches.  Neurological: Negative for dizziness, headaches or paresthesias.   Physical Exam: BP (!) 134/58 (BP Location: Left Arm, Patient Position: Sitting, Cuff Size: Normal)   Pulse 71   Ht 5' 5.1" (1.654 m)   Wt 159 lb 12.8 oz (72.5 kg)   LMP 04/19/1992   BMI 26.51 kg/m   Wt Readings from Last 3 Encounters:  06/21/23 159 lb 12.8 oz (72.5 kg)  03/03/23 157 lb 3.2 oz (71.3 kg)  02/14/23 156 lb 9.6 oz (71 kg)    General: 78 year old female in no acute distress. Head: Normocephalic and atraumatic. Eyes: No scleral icterus. Conjunctiva pink . Ears: Normal auditory acuity. Mouth: Dentition intact. No ulcers or lesions.  Lungs: Clear throughout to auscultation. Heart: Regular rate and rhythm, no murmur. Abdomen: Soft, nontender and nondistended. No masses or hepatomegaly. Normal bowel sounds x 4 quadrants.  Rectal: Deferred.  Musculoskeletal: Symmetrical with no gross deformities. Extremities: No edema. Neurological: Alert oriented x 4. No focal deficits.  Psychological: Alert and cooperative. Normal mood and affect  Assessment and Recommendations:  78 year old female with ileal Crohn's disease, stable.  Colonoscopy 09/2018 showed a few erosions in the terminal ileum consistent with very mild ileal Crohn's disease. -Continue Mesalamine CR1.5gm 3 capsules bid, patient request 30-day RX with refills which is preferred by her insurance carrier -Diet as tolerated   GERD with possible LPR.  Normal EGD in 2017.  GERD symptoms well-controlled on Omeprazole 40 mg daily. -Continue Omeprazole 40 mg daily -Maintain GERD diet -Recommend checking a vitamin D level with next routine lab  draw with PCP -Exercise as tolerated, continue Weight Watachers -I did not recommend Ozempic, due to potential GI side effects in setting of patient with Crohn's disease    History of colon polyps. A 10mm sessile serrated adenomatous polyp was removed from the ascending colon per colonoscopy 08/2015.  Colonoscopy 09/2018 showed a few erosions in the TI, diverticulosis without colon polyps. -Next colon polyp surveillance colonoscopy due June 2025, patient prefers to schedule a colonoscopy fall 2025 -Colonoscopy benefits and risks discussed including risk with sedation, risk of bleeding, perforation and infection  -Patient to contact our office Summer 2025 to schedule colonoscopy with Dr. Rhea Belton September 2025  Today's encounter was 25 minutes which included precharting, chart/result review, history/exam, face-to-face time used for counseling, formulating a treatment plan with follow-up and documentation.

## 2023-06-21 NOTE — Patient Instructions (Addendum)
 Contact our office summer 2025 to schedule colonoscopy for the fall.  We have sent the following medications to your pharmacy for you to pick up at your convenience: Mesalamine and Omeprazole  Due to recent changes in healthcare laws, you may see the results of your imaging and laboratory studies on MyChart before your provider has had a chance to review them.  We understand that in some cases there may be results that are confusing or concerning to you. Not all laboratory results come back in the same time frame and the provider may be waiting for multiple results in order to interpret others.  Please give [place] 48 hours in order for your provider to thoroughly review all the results before contacting the office for clarification of your results.   Thank you for trusting me with your gastrointestinal care!   Alcide Evener, CRNP

## 2023-06-23 NOTE — Progress Notes (Signed)
 Addendum: Reviewed and agree with assessment and management plan. I would not be opposed to GLP-1 therapy in a patient with history of Crohn's disease but if prescribed would need to be by PCP Aamir Mclinden, Carie Caddy, MD

## 2023-06-27 NOTE — Progress Notes (Signed)
 I called patient and discussed Dr. Lauro Franklin input as noted in his addendum. Patient appreciated the call. She will contact her PCP if she wishes to pursue Ozempic or other GLP 1 for weight loss.

## 2023-07-01 ENCOUNTER — Other Ambulatory Visit: Payer: Self-pay | Admitting: Nurse Practitioner

## 2023-08-02 DIAGNOSIS — D2261 Melanocytic nevi of right upper limb, including shoulder: Secondary | ICD-10-CM | POA: Diagnosis not present

## 2023-08-02 DIAGNOSIS — D225 Melanocytic nevi of trunk: Secondary | ICD-10-CM | POA: Diagnosis not present

## 2023-08-02 DIAGNOSIS — L304 Erythema intertrigo: Secondary | ICD-10-CM | POA: Diagnosis not present

## 2023-08-02 DIAGNOSIS — D2271 Melanocytic nevi of right lower limb, including hip: Secondary | ICD-10-CM | POA: Diagnosis not present

## 2023-08-02 DIAGNOSIS — L814 Other melanin hyperpigmentation: Secondary | ICD-10-CM | POA: Diagnosis not present

## 2023-08-02 DIAGNOSIS — L820 Inflamed seborrheic keratosis: Secondary | ICD-10-CM | POA: Diagnosis not present

## 2023-08-02 DIAGNOSIS — L821 Other seborrheic keratosis: Secondary | ICD-10-CM | POA: Diagnosis not present

## 2023-08-02 DIAGNOSIS — D235 Other benign neoplasm of skin of trunk: Secondary | ICD-10-CM | POA: Diagnosis not present

## 2023-09-01 ENCOUNTER — Other Ambulatory Visit: Payer: Self-pay | Admitting: Internal Medicine

## 2023-09-28 ENCOUNTER — Other Ambulatory Visit: Payer: Self-pay | Admitting: Internal Medicine

## 2023-12-20 ENCOUNTER — Encounter: Payer: Self-pay | Admitting: Family Medicine

## 2023-12-20 ENCOUNTER — Ambulatory Visit: Admitting: Family Medicine

## 2023-12-20 VITALS — BP 168/60 | HR 69 | Temp 98.0°F | Wt 161.9 lb

## 2023-12-20 DIAGNOSIS — I10 Essential (primary) hypertension: Secondary | ICD-10-CM | POA: Diagnosis not present

## 2023-12-20 MED ORDER — AMLODIPINE BESYLATE 5 MG PO TABS: 5.0000 mg | ORAL_TABLET | Freq: Every day | ORAL | 3 refills | Status: DC

## 2023-12-20 NOTE — Progress Notes (Signed)
 Established Patient Office Visit  Subjective   Patient ID: Bridget Reyes, female    DOB: 24-Sep-1945  Age: 78 y.o. MRN: [id number]  Chief Complaint  Patient presents with   Hypertension    HPI   Mrs Eppolito is seen today for elevated blood pressure.  She has apparently had some borderline elevated blood pressure for quite some time.  She recently joined [employer] to start an exercise program and had blood pressure reading of 180 systolic and was told she could not participate yet until this is addressed.  She brings in a log of home blood pressure readings and has had several readings over 140-150 systolic.  Her diastolics have been consistently in the 50s and 60s.  She denies any recent headaches.  No peripheral edema.  No nonsteroidal use.  Infrequent alcohol use.  Currently not exercising regularly.  Has never taken blood pressure medications previously.  Past Medical History:  Diagnosis Date   Allergy    Anxiety    not treated    Arthritis    knees, neck - no meds   Cataract    bilateral - MD just watchning   Colon polyps    Crohn's    Diverticulosis    Eczema    Esophageal reflux    Gastritis    Gout    Hiatal hernia    Hyperlipemia    Ileitis    Migraines    tx with naproxen - last one 4 yrs ago   Rosacea    Skin cancer of face    scca removed    Past Surgical History:  Procedure Laterality Date   BREAST BIOPSY Left 1980   CATARACT EXTRACTION, BILATERAL  01/2022   COLONOSCOPY  08/2015   Pyrtle -polyp   DILATION AND CURETTAGE OF UTERUS     POLYPECTOMY     TONSILLECTOMY     TOTAL VAGINAL HYSTERECTOMY  1994   UPPER GASTROINTESTINAL ENDOSCOPY     UPPER GI ENDOSCOPY     hiatal hernia   WISDOM TOOTH EXTRACTION      reports that she has never smoked. She has never used smokeless tobacco. She reports current alcohol use of about 1.0 - 2.0 standard drink of alcohol per week. She reports that she does not use drugs. family history includes Breast cancer in  her cousin, daughter, and paternal aunt; Colon polyps in her sister; Congestive Heart Failure in her father; Heart disease in her father; Hypertension in her father; Lung cancer in her mother; Parkinsonism in her mother. Allergies  Allergen Reactions   Sulfamethoxazole-Trimethoprim Hives    REACTION: rash   Amoxicillin     REACTION: unspecified   Bactrim [Sulfamethoxazole-Trimethoprim]    Penicillins Hives   Sertraline     Hair loss    Tetracaine Itching and Swelling   Topamax [Topiramate]     Hair loss   Remeron  [Mirtazapine ] Other (See Comments)    Increased ravenous appetite    Review of Systems  Constitutional:  Negative for malaise/fatigue.  Eyes:  Negative for blurred vision.  Respiratory:  Negative for shortness of breath.   Cardiovascular:  Negative for chest pain.  Neurological:  Negative for dizziness, weakness and headaches.      Objective:     BP (!) 168/60 (BP Location: Left Arm, Cuff Size: Normal)   Pulse 69   Temp 98 F (36.7 C) (Oral)   Wt 161 lb 14.4 oz (73.4 kg)   LMP 04/19/1992   SpO2 95%  BMI 26.86 kg/m  BP Readings from Last 3 Encounters:  12/20/23 (!) 168/60  06/21/23 (!) 134/58  04/07/23 138/66   Wt Readings from Last 3 Encounters:  12/20/23 161 lb 14.4 oz (73.4 kg)  06/21/23 159 lb 12.8 oz (72.5 kg)  03/03/23 157 lb 3.2 oz (71.3 kg)      Physical Exam Vitals reviewed.  Constitutional:      General: She is not in acute distress.    Appearance: She is well-developed. She is not ill-appearing.  Eyes:     Pupils: Pupils are equal, round, and reactive to light.  Neck:     Thyroid : No thyromegaly.     Vascular: No JVD.  Cardiovascular:     Rate and Rhythm: Normal rate and regular rhythm.     Heart sounds:     No gallop.  Pulmonary:     Effort: Pulmonary effort is normal. No respiratory distress.     Breath sounds: Normal breath sounds. No wheezing or rales.  Musculoskeletal:     Cervical back: Neck supple.     Right lower leg:  No edema.     Left lower leg: No edema.  Neurological:     Mental Status: She is alert.      No results found for any visits on 12/20/23.    The 10-year ASCVD risk score (Arnett DK, et al., 2019) is: 33.5%    Assessment & Plan:   Essential hypertension.  She has isolated systolic hypertension.  Currently untreated.  Multiple home readings recently over 140 systolic and recent reading 180 systolic when she went to initiate exercise program.  Repeat today left arm seated after rest 168/60  -Discussed nonpharmacologic management with weight control, low-sodium diet with goal of less than 2500 mg sodium per day, regular aerobic exercise such as walking, avoidance of regular nonsteroidals, alcohol in moderation  -Discussed initiation of dihydropyridine calcium channel blocker with amlodipine  5 mg daily.  Reviewed potential side effects.  Set up follow-up in about 4 weeks to reassess with primary   Wolm Scarlet, MD

## 2023-12-22 ENCOUNTER — Other Ambulatory Visit: Payer: Self-pay | Admitting: Obstetrics & Gynecology

## 2023-12-22 DIAGNOSIS — Z1231 Encounter for screening mammogram for malignant neoplasm of breast: Secondary | ICD-10-CM

## 2023-12-30 ENCOUNTER — Other Ambulatory Visit: Payer: Self-pay | Admitting: Nurse Practitioner

## 2024-01-20 ENCOUNTER — Ambulatory Visit: Admitting: Family Medicine

## 2024-01-24 ENCOUNTER — Ambulatory Visit: Admitting: Internal Medicine

## 2024-01-24 ENCOUNTER — Other Ambulatory Visit: Payer: Self-pay | Admitting: Internal Medicine

## 2024-01-24 VITALS — BP 146/56 | HR 59 | Temp 97.7°F | Ht 65.1 in | Wt 158.4 lb

## 2024-01-24 DIAGNOSIS — E785 Hyperlipidemia, unspecified: Secondary | ICD-10-CM | POA: Diagnosis not present

## 2024-01-24 DIAGNOSIS — Z79899 Other long term (current) drug therapy: Secondary | ICD-10-CM

## 2024-01-24 DIAGNOSIS — I10 Essential (primary) hypertension: Secondary | ICD-10-CM

## 2024-01-24 DIAGNOSIS — Z8739 Personal history of other diseases of the musculoskeletal system and connective tissue: Secondary | ICD-10-CM | POA: Diagnosis not present

## 2024-01-24 DIAGNOSIS — R208 Other disturbances of skin sensation: Secondary | ICD-10-CM

## 2024-01-24 DIAGNOSIS — K5080 Crohn's disease of both small and large intestine without complications: Secondary | ICD-10-CM

## 2024-01-24 NOTE — Progress Notes (Signed)
 Chief Complaint  Patient presents with   Medical Management of Chronic Issues    HPI: Bridget Reyes 78 y.o. come in for Chronic disease management  6 mos check  Needs  note for [facility]  Feels ok  Bp log  here today   Recent stress but doing ok  citalopram  taking 20( 1/2 of 40 mg per day) Recent  downsized.  To smaller home some stress an dispution  Went to [facility] and bp was very high 180 range and told needed medical permission to continue.   She take amlodipine   and has log of bp readings today .  Tends to be high in am  From the last months.   Problem with feet . Balls of feet burn.   Feet  goes to sleep when wlaking a bit .  Hit gout if  attack . Hard to get weight down  doing weight watchers  ROS: See pertinent positives and negatives per HPI.  Past Medical History:  Diagnosis Date   Allergy    Anxiety    not treated    Arthritis    knees, neck - no meds   Cataract    bilateral - MD just watchning   Colon polyps    Crohn's    Diverticulosis    Eczema    Esophageal reflux    Gastritis    Gout    Hiatal hernia    Hyperlipemia    Ileitis    Migraines    tx with naproxen - last one 4 yrs ago   Rosacea    Skin cancer of face    scca removed     Family History  Problem Relation Age of Onset   Parkinsonism Mother    Lung cancer Mother    Heart disease Father        died chf in 70 s   Hypertension Father    Congestive Heart Failure Father    Colon polyps Sister    Breast cancer Daughter    Breast cancer Paternal Aunt    Breast cancer Cousin    Colon cancer Neg Hx    Esophageal cancer Neg Hx    Rectal cancer Neg Hx    Stomach cancer Neg Hx     Social History   Socioeconomic History   Marital status: Married    Spouse name: Not on file   Number of children: 2   Years of education: Not on file   Highest education level: Bachelor's degree (e.g., BA, AB, BS)  Occupational History   Occupation: retired     Comment: Production assistant, radio: UNEMPLOYED  Tobacco Use   Smoking status: Never   Smokeless tobacco: Never  Vaping Use   Vaping status: Never Used  Substance and Sexual Activity   Alcohol use: Yes    Alcohol/week: 1.0 - 2.0 standard drink of alcohol    Types: 1 - 2 Glasses of wine per week   Drug use: No   Sexual activity: Yes    Partners: Male    Birth control/protection: Surgical    Comment: hysterectomy  Other Topics Concern   Not on file  Social History Narrative   hh  Of 2       Dog    Married    G2P2   Social Drivers of Health   Financial Resource Strain: Low Risk  (01/24/2024)   Overall Financial Resource Strain (CARDIA)    Difficulty of Paying Living  Expenses: Not hard at all  Food Insecurity: No Food Insecurity (01/24/2024)   Hunger Vital Sign    Worried About Running Out of Food in the Last Year: Never true    Ran Out of Food in the Last Year: Never true  Transportation Needs: No Transportation Needs (01/24/2024)   PRAPARE - Administrator, Civil Service (Medical): No    Lack of Transportation (Non-Medical): No  Physical Activity: Insufficiently Active (01/24/2024)   Exercise Vital Sign    Days of Exercise per Week: 3 days    Minutes of Exercise per Session: 30 min  Stress: Stress Concern Present (01/24/2024)   [employer] of Occupational Health - Occupational Stress Questionnaire    Feeling of Stress: Rather much  Social Connections: Moderately Integrated (01/24/2024)   Social Connection and Isolation Panel    Frequency of Communication with Friends and Family: Once a week    Frequency of Social Gatherings with Friends and Family: Once a week    Attends Religious Services: More than 4 times per year    Active Member of [employer] or Organizations: Yes    Attends Engineer, structural: More than 4 times per year    Marital Status: Married    Outpatient Medications Prior to Visit  Medication Sig Dispense Refill   acetaminophen (TYLENOL) 500 MG tablet Take  500 mg by mouth every 6 (six) hours as needed.     amLODipine  (NORVASC ) 5 MG tablet Take 1 tablet (5 mg total) by mouth daily. 30 tablet 3   Cetirizine HCl (ZYRTEC PO) Take 1 tablet by mouth daily.     citalopram  (CELEXA ) 40 MG tablet Take 0.5 tablets (20 mg total) by mouth daily. Dosage change 90 tablet 1   ipratropium (ATROVENT ) 0.06 % nasal spray USE 2 SPRAYS IN EACH NOSTRIL TWICE TO THREE TIMES DAILY. 45 mL prn   mesalamine  (PENTASA ) 500 MG CR capsule TAKE 3 CAPSULES BY MOUTH TWICE DAILY 540 capsule 0   MULTIPLE VITAMIN PO Take 1 tablet by mouth daily.     omeprazole  (PRILOSEC) 40 MG capsule Take 40 mg by mouth daily.     OVER THE COUNTER MEDICATION in the morning and at bedtime. Focus Select for eye vitamins and supplements.     simvastatin  (ZOCOR ) 40 MG tablet TAKE 1 TABLET(40 MG) BY MOUTH DAILY 90 tablet 3   traZODone  (DESYREL ) 50 MG tablet TAKE 1/2 TO 1 TABLET(25 TO 50 MG) BY MOUTH AT BEDTIME AS NEEDED FOR SLEEP 30 tablet 1   valACYclovir  (VALTREX ) 1000 MG tablet Take 2 tablets (2,000 mg total) by mouth 2 (two) times daily as needed. 90 tablet 3   Multiple Vitamins-Minerals (PRESERVISION AREDS 2 PO) Take 1 tablet by mouth in the morning and at bedtime. (Patient not taking: Reported on 01/24/2024)     NON FORMULARY as needed. CBD Cream- applies prn to muscles, joints (Patient not taking: Reported on 01/24/2024)     fluticasone  (FLONASE ) 50 MCG/ACT nasal spray Place 2 sprays into both nostrils as needed.     omeprazole  (PRILOSEC) 40 MG capsule Take 1 capsule (40 mg total) by mouth daily. Take 30 minutes before breakfast. 90 capsule 3   No facility-administered medications prior to visit.     EXAM:  BP (!) 146/56 (BP Location: Right Arm, Patient Position: Sitting, Cuff Size: Normal)   Pulse (!) 59   Temp 97.7 F (36.5 C) (Oral)   Ht 5' 5.1 (1.654 m)   Wt 158 lb 6.4 oz (71.8  kg)   LMP 04/19/1992   SpO2 98%   BMI 26.28 kg/m   Body mass index is 26.28 kg/m.  GENERAL: vitals  reviewed and listed above, alert, oriented, appears well hydrated and in no acute distress HEENT: atraumatic, conjunctiva  clear, no obvious abnormalities on inspection of external nose and ears  NECK: no obvious masses on inspection palpation  LUNGS: clear to auscultation bilaterally, no wheezes, rales or rhonchi, good air movement CV: HRRR, no clubbing cyanosis or  peripheral edema nl cap refill  MS: moves all extremities without noticeable focal  abnormality Feet  nl inspection  nl pulse and cap refill noulcers lesion. Redness  PSYCH: pleasant and cooperative, no obvious depression or anxiety Lab Results  Component Value Date   WBC 6.5 03/03/2023   HGB 14.2 03/03/2023   HCT 42.1 03/03/2023   PLT 239.0 03/03/2023   GLUCOSE 93 03/03/2023   CHOL 186 03/03/2023   TRIG 139.0 03/03/2023   HDL 45.60 03/03/2023   LDLCALC 113 (H) 03/03/2023   ALT 19 03/03/2023   AST 22 03/03/2023   NA 136 03/03/2023   K 3.6 03/03/2023   CL 99 03/03/2023   CREATININE 0.78 03/03/2023   BUN 18 03/03/2023   CO2 32 03/03/2023   TSH 4.36 03/03/2023   HGBA1C 5.7 03/03/2023   BP Readings from Last 3 Encounters:  01/24/24 (!) 146/56  12/20/23 (!) 168/60  06/21/23 (!) 134/58  Bp log review:   most are below 140  diastolic's in 50 s  pulses in 50 - 60 s Some in am are 140 150 range   118 120s  many times of day .  ASSESSMENT AND PLAN:  Discussed the following assessment and plan:  Essential hypertension - lab update consider  intenisfication but   caution with diastolic  may be add low dose valsartan if appropriate.  and avoid  diuretic  Medication management - uncertain risk benefit of the citalopram   but sould stay on lower dose 20 mg for now she says has hungry all the time feeling. fu at next visit  Hyperlipidemia, unspecified hyperlipidemia type - update lab on simvastatin   Burning sensation of feet - has progressed have checked b12 in past  has risk with  IBD. sx worse with exercise walking .  consider neuro or ms referral after metabolic check  History of gout - r foot  is sensitive Note for  gym program  avoid prolong breath holding otherwise ok  May plan a neuro or other referral for  sx .  Record review and plan fu   ( no neuro eva for feet sx  present in past but now progressing assoc with walking?) Future lab orders have been placed  -Patient advised to return or notify health care team  if  new concerns arise.  Patient Instructions  Due for yearly lab and add  b12 level and make sure no diabetes.  Get fasting lab appt  soon.   Bp  goal is below 140/90   best is 130/80 but not compelling.  Ok to stay on the lower dose of citalopram   20 mg  And will revisit at fu.  Keep the nov appt .   Ok to  go to [facility]. Based on today s visit .  Avoid breath holding .      Felix Pratt K. Robena Ewy M.D.

## 2024-01-24 NOTE — Patient Instructions (Addendum)
 Due for yearly lab and add  b12 level and make sure no diabetes.  Get fasting lab appt  soon.   Bp  goal is below 140/90   best is 130/80 but not compelling.  Ok to stay on the lower dose of citalopram   20 mg  And will revisit at fu.  Keep the nov appt .   Ok to  go to [employer] [facility]. Based on today s visit .  Avoid breath holding .

## 2024-01-24 NOTE — Progress Notes (Signed)
 Future labs for med monitoring and sx  concerns

## 2024-01-30 ENCOUNTER — Ambulatory Visit
Admission: RE | Admit: 2024-01-30 | Discharge: 2024-01-30 | Disposition: A | Source: Ambulatory Visit | Attending: Obstetrics & Gynecology | Admitting: Obstetrics & Gynecology

## 2024-01-30 DIAGNOSIS — Z1231 Encounter for screening mammogram for malignant neoplasm of breast: Secondary | ICD-10-CM

## 2024-02-11 ENCOUNTER — Other Ambulatory Visit: Payer: Self-pay | Admitting: Internal Medicine

## 2024-02-15 ENCOUNTER — Other Ambulatory Visit (INDEPENDENT_AMBULATORY_CARE_PROVIDER_SITE_OTHER)

## 2024-02-15 DIAGNOSIS — I10 Essential (primary) hypertension: Secondary | ICD-10-CM

## 2024-02-15 DIAGNOSIS — K5080 Crohn's disease of both small and large intestine without complications: Secondary | ICD-10-CM | POA: Diagnosis not present

## 2024-02-15 DIAGNOSIS — Z8739 Personal history of other diseases of the musculoskeletal system and connective tissue: Secondary | ICD-10-CM | POA: Diagnosis not present

## 2024-02-15 DIAGNOSIS — E785 Hyperlipidemia, unspecified: Secondary | ICD-10-CM

## 2024-02-15 DIAGNOSIS — R208 Other disturbances of skin sensation: Secondary | ICD-10-CM | POA: Diagnosis not present

## 2024-02-15 DIAGNOSIS — Z79899 Other long term (current) drug therapy: Secondary | ICD-10-CM

## 2024-02-15 LAB — HEPATIC FUNCTION PANEL
ALT: 18 U/L (ref 0–35)
AST: 17 U/L (ref 0–37)
Albumin: 4.5 g/dL (ref 3.5–5.2)
Alkaline Phosphatase: 77 U/L (ref 39–117)
Bilirubin, Direct: 0.1 mg/dL (ref 0.0–0.3)
Total Bilirubin: 0.4 mg/dL (ref 0.2–1.2)
Total Protein: 7 g/dL (ref 6.0–8.3)

## 2024-02-15 LAB — BASIC METABOLIC PANEL WITH GFR
BUN: 20 mg/dL (ref 6–23)
CO2: 30 meq/L (ref 19–32)
Calcium: 9.2 mg/dL (ref 8.4–10.5)
Chloride: 101 meq/L (ref 96–112)
Creatinine, Ser: 0.8 mg/dL (ref 0.40–1.20)
GFR: 70.35 mL/min (ref >60.00)
Glucose, Bld: 87 mg/dL (ref 70–99)
Potassium: 4.1 meq/L (ref 3.5–5.1)
Sodium: 137 meq/L (ref 135–145)

## 2024-02-15 LAB — LIPID PANEL
Cholesterol: 157 mg/dL (ref 0–200)
HDL: 39.9 mg/dL (ref >39.00)
LDL Cholesterol: 93 mg/dL (ref 0–99)
NonHDL: 117.59
Total CHOL/HDL Ratio: 4
Triglycerides: 125 mg/dL (ref 0.0–149.0)
VLDL: 25 mg/dL (ref 0.0–40.0)

## 2024-02-15 LAB — HEMOGLOBIN A1C: Hgb A1c MFr Bld: 5.9 % (ref 4.6–6.5)

## 2024-02-15 LAB — CBC WITH DIFFERENTIAL/PLATELET
Basophils Absolute: 0.1 K/uL (ref 0.0–0.1)
Basophils Relative: 1.1 % (ref 0.0–3.0)
Eosinophils Absolute: 0.2 K/uL (ref 0.0–0.7)
Eosinophils Relative: 2.9 % (ref 0.0–5.0)
HCT: 39.9 % (ref 36.0–46.0)
Hemoglobin: 13.5 g/dL (ref 12.0–15.0)
Lymphocytes Relative: 21.7 % (ref 12.0–46.0)
Lymphs Abs: 1.1 K/uL (ref 0.7–4.0)
MCHC: 34 g/dL (ref 30.0–36.0)
MCV: 92.6 fl (ref 78.0–100.0)
Monocytes Absolute: 0.5 K/uL (ref 0.1–1.0)
Monocytes Relative: 9.8 % (ref 3.0–12.0)
Neutro Abs: 3.4 K/uL (ref 1.4–7.7)
Neutrophils Relative %: 64.5 % (ref 43.0–77.0)
Platelets: 234 K/uL (ref 150.0–400.0)
RBC: 4.3 Mil/uL (ref 3.87–5.11)
RDW: 12.9 % (ref 11.5–15.5)
WBC: 5.3 K/uL (ref 4.0–10.5)

## 2024-02-15 LAB — URIC ACID: Uric Acid, Serum: 4 mg/dL (ref 2.4–7.0)

## 2024-02-15 LAB — MICROALBUMIN / CREATININE URINE RATIO
Creatinine,U: 37 mg/dL
Microalb Creat Ratio: UNDETERMINED mg/g (ref 0.0–30.0)
Microalb, Ur: <0.7 mg/dL

## 2024-02-15 LAB — C-REACTIVE PROTEIN: CRP: <0.5 mg/dL (ref 0.5–20.0)

## 2024-02-15 LAB — TSH: TSH: 5.13 u[IU]/mL (ref 0.35–5.50)

## 2024-02-15 LAB — T4, FREE: Free T4: 0.57 ng/dL — ABNORMAL LOW (ref 0.60–1.60)

## 2024-02-15 LAB — VITAMIN B12: Vitamin B-12: 313 pg/mL (ref 211–911)

## 2024-02-21 ENCOUNTER — Encounter: Payer: Self-pay | Admitting: Family Medicine

## 2024-02-21 ENCOUNTER — Ambulatory Visit: Admitting: Family Medicine

## 2024-02-21 DIAGNOSIS — Z0000 Encounter for general adult medical examination without abnormal findings: Secondary | ICD-10-CM | POA: Diagnosis not present

## 2024-02-21 NOTE — Progress Notes (Signed)
 Because this visit was a virtual/telehealth visit, some criteria may be missing or patient reported. Any vitals not documented were not able to be obtained and vitals that have been documented are patient reported.    MEDICARE ANNUAL PREVENTIVE VISIT WITH PROVIDER: (Welcome to Medicare, initial annual wellness or annual wellness exam)  Virtual Visit via Video Note  I connected with Bridget Reyes on 02/21/24 by phone and verified that I am speaking with the correct person using two identifiers.  Location patient: home Location provider:work or home office Persons participating in the virtual visit: patient, provider  Concerns and/or follow up today: see flowsheets for detailed intake and health assessment.   How often do you have a drink containing alcohol?1-2 days per week How many drinks containing alcohol do you have on a typical day when you are drinking?1 How often do you have six or more drinks on one occasion?never Have you ever smoked?n Quit date if applicable? na  How many packs a day do/did you smoke? na Do you use smokeless tobacco?n Do you use an illicit drugs?n Do you feel safe at home?y Last dentist visit?just went recently   Last eye Exam and location? Once a year, Dr. Cleotilde   See HM section in Epic for other details of completed HM.    ROS: negative for report of fevers, unintentional weight loss, vision changes, vision loss, hearing loss or change, chest pain, sob, hemoptysis, melena, hematochezia, hematuria, falls, bleeding or bruising, thoughts of suicide or self harm, memory loss  Patient-completed extensive health risk assessment - reviewed and discussed with the patient: See Health Risk Assessment completed with patient prior to the visit either above or in recent phone note. This was reviewed in detailed with the patient today and appropriate recommendations, orders and referrals were placed as needed per Summary below and patient  instructions.   Review of Medical History: -PMH, PSH, Family History and current specialty and care providers reviewed and updated and listed below   Patient Care Team: Panosh, Apolinar POUR, MD as PCP - General Arnaldo Juliene RAMAN, MD (Family Medicine) Jordan, Amy, MD (Dermatology) Cleotilde Ronal RAMAN, MD (Gynecology) Oneita Na, MD as Referring Physician (Specialist) Patrcia Sharper, MD (Ophthalmology) Mable Lenis, MD (Inactive) as Consulting Physician (Otolaryngology) Cleotilde Sewer, OD (Optometry) Pyrtle, Gordy HERO, MD as Consulting Physician (Gastroenterology)   Past Medical History:  Diagnosis Date   Allergy    Anxiety    not treated    Arthritis    knees, neck - no meds   Blood transfusion without reported diagnosis    Cataract    bilateral - MD just watchning   Colon polyps    Crohn's    Diverticulosis    Eczema    Esophageal reflux    Gastritis    Gout    Hiatal hernia    Hyperlipemia    Hypertension    Ileitis    Migraines    tx with naproxen - last one 4 yrs ago   Rosacea    Skin cancer of face    scca removed     Past Surgical History:  Procedure Laterality Date   ABDOMINAL HYSTERECTOMY     BREAST BIOPSY Left 1980   CATARACT EXTRACTION, BILATERAL  01/2022   COLONOSCOPY  08/2015   Pyrtle -polyp   COSMETIC SURGERY     DILATION AND CURETTAGE OF UTERUS     POLYPECTOMY     TONSILLECTOMY     TOTAL VAGINAL HYSTERECTOMY  1994  UPPER GASTROINTESTINAL ENDOSCOPY     UPPER GI ENDOSCOPY     hiatal hernia   WISDOM TOOTH EXTRACTION      Social History   Socioeconomic History   Marital status: Married    Spouse name: Not on file   Number of children: 2   Years of education: Not on file   Highest education level: Bachelor's degree (e.g., BA, AB, BS)  Occupational History   Occupation: retired     Comment: Advertising Account Executive: UNEMPLOYED  Tobacco Use   Smoking status: Never   Smokeless tobacco: Never  Vaping Use   Vaping status: Never Used   Substance and Sexual Activity   Alcohol use: Yes    Alcohol/week: 1.0 - 2.0 standard drink of alcohol    Types: 1 - 2 Glasses of wine per week   Drug use: No   Sexual activity: Yes    Partners: Male    Birth control/protection: Surgical    Comment: hysterectomy  Other Topics Concern   Not on file  Social History Narrative   hh  Of 2       Dog    Married    G2P2   Social Drivers of Health   Financial Resource Strain: Low Risk  (01/24/2024)   Overall Financial Resource Strain (CARDIA)    Difficulty of Paying Living Expenses: Not hard at all  Food Insecurity: No Food Insecurity (01/24/2024)   Hunger Vital Sign    Worried About Running Out of Food in the Last Year: Never true    Ran Out of Food in the Last Year: Never true  Transportation Needs: No Transportation Needs (01/24/2024)   PRAPARE - Administrator, Civil Service (Medical): No    Lack of Transportation (Non-Medical): No  Physical Activity: Insufficiently Active (02/21/2024)   Exercise Vital Sign    Days of Exercise per Week: 3 days    Minutes of Exercise per Session: 30 min  Stress: Stress Concern Present (02/21/2024)   [employer] of Occupational Health - Occupational Stress Questionnaire    Feeling of Stress: To some extent  Social Connections: Socially Integrated (02/21/2024)   Social Connection and Isolation Panel    Frequency of Communication with Friends and Family: More than three times a week    Frequency of Social Gatherings with Friends and Family: Three times a week    Attends Religious Services: More than 4 times per year    Active Member of Clubs or Organizations: Yes    Attends Banker Meetings: More than 4 times per year    Marital Status: Married  Catering Manager Violence: Not At Risk (12/14/2021)   Humiliation, Afraid, Rape, and Kick questionnaire    Fear of Current or Ex-Partner: No    Emotionally Abused: No    Physically Abused: No    Sexually Abused: No    Family  History  Problem Relation Age of Onset   Parkinsonism Mother    Lung cancer Mother    Heart disease Father        died chf in 68 s   Hypertension Father    Congestive Heart Failure Father    Colon polyps Sister    Breast cancer Daughter    Breast cancer Paternal Aunt    Breast cancer Cousin    Colon cancer Neg Hx    Esophageal cancer Neg Hx    Rectal cancer Neg Hx    Stomach cancer Neg Hx  Current Outpatient Medications on File Prior to Visit  Medication Sig Dispense Refill   acetaminophen (TYLENOL) 500 MG tablet Take 500 mg by mouth every 6 (six) hours as needed.     amLODipine  (NORVASC ) 5 MG tablet Take 1 tablet (5 mg total) by mouth daily. 30 tablet 3   citalopram  (CELEXA ) 40 MG tablet Take 0.5 tablets (20 mg total) by mouth daily. Dosage change 90 tablet 1   ipratropium (ATROVENT ) 0.06 % nasal spray USE 2 SPRAYS IN EACH NOSTRIL TWICE TO THREE TIMES DAILY. 45 mL prn   mesalamine  (PENTASA ) 500 MG CR capsule TAKE 3 CAPSULES BY MOUTH TWICE DAILY 540 capsule 0   MULTIPLE VITAMIN PO Take 1 tablet by mouth daily.     Multiple Vitamins-Minerals (PRESERVISION AREDS 2 PO) Take 1 tablet by mouth in the morning and at bedtime.     NON FORMULARY as needed. CBD Cream- applies prn to muscles, joints     omeprazole  (PRILOSEC) 40 MG capsule Take 40 mg by mouth daily.     OVER THE COUNTER MEDICATION in the morning and at bedtime. Focus Select for eye vitamins and supplements.     simvastatin  (ZOCOR ) 40 MG tablet TAKE 1 TABLET(40 MG) BY MOUTH DAILY 90 tablet 3   traZODone  (DESYREL ) 50 MG tablet TAKE 1/2 TO 1 TABLET(25 TO 50 MG) BY MOUTH AT BEDTIME AS NEEDED FOR SLEEP 30 tablet 1   valACYclovir  (VALTREX ) 1000 MG tablet Take 2 tablets (2,000 mg total) by mouth 2 (two) times daily as needed. 90 tablet 3   No current facility-administered medications on file prior to visit.    Allergies  Allergen Reactions   Sulfamethoxazole-Trimethoprim Hives    REACTION: rash   Amoxicillin     REACTION:  unspecified   Bactrim [Sulfamethoxazole-Trimethoprim]    Penicillins Hives   Sertraline     Hair loss    Tetracaine Itching and Swelling   Topamax [Topiramate]     Hair loss   Remeron  [Mirtazapine ] Other (See Comments)    Increased ravenous appetite       Physical Exam Vitals requested from patient and listed below if patient had equipment and was able to obtain at home for this virtual visit: There were no vitals filed for this visit. Estimated body mass index is 26.28 kg/m as calculated from the following:   Height as of 01/24/24: 5' 5.1 (1.654 m).   Weight as of 01/24/24: 158 lb 6.4 oz (71.8 kg).  EKG (optional): deferred due to virtual visit  GENERAL: alert, oriented, no acute distress detected, full vision exam deferred due to pandemic and/or virtual encounter   HEENT: atraumatic, conjunttiva clear, no obvious abnormalities on inspection of external nose and ears  NECK: normal movements of the head and neck  LUNGS: on inspection no signs of respiratory distress, breathing rate appears normal, no obvious gross SOB, gasping or wheezing  CV: no obvious cyanosis  MS: moves all visible extremities without noticeable abnormality  PSYCH/NEURO: pleasant and cooperative, no obvious depression or anxiety, speech and thought processing grossly intact, Cognitive function grossly intact  Flowsheet Row Clinical Support from 02/21/2024 in [facility]  PHQ-9 Total Score 10        02/21/2024    3:58 PM 04/07/2023    9:52 AM 03/03/2023   10:38 AM 02/14/2023    2:19 PM 01/20/2023   10:39 AM  Depression screen PHQ 2/9  Decreased Interest 0 0 0 0 0  Down, Depressed, Hopeless 0 0  0 0 0  PHQ - 2 Score 0 0 0 0 0  Altered sleeping 3 3 2     Tired, decreased energy 3 1 2     Change in appetite 3 3 1     Feeling bad or failure about yourself  0 0 0    Trouble concentrating 1 1 0    Moving slowly or fidgety/restless 0 0 0    Suicidal thoughts 0 0 0     PHQ-9 Score 10 8 5     Difficult doing work/chores  Not difficult at all Not difficult at all         01/20/2023   10:39 AM 02/14/2023    2:19 PM 03/03/2023   10:38 AM 02/21/2024    3:56 PM 02/21/2024    4:32 PM  Fall Risk  Falls in the past year? 0 0 0 0   Was there an injury with Fall? 0 0 0 0   Fall Risk Category Calculator 0 0 0 0   Patient at Risk for Falls Due to No Fall Risks  No Fall Risks No Fall Risks No Fall Risks  Fall risk Follow up Falls evaluation completed  Falls evaluation completed Falls evaluation completed Falls evaluation completed     SUMMARY AND PLAN:  Encounter for Medicare annual wellness exam   Discussed applicable health maintenance/preventive health measures and advised and referred or ordered per patient preferences: -she plans to schedule appt with GI for colonoscopy -reports did updated flu and covid shots, agrees to bring record -she plans to get Tdap at the pharmacy -discussed bone density test and she plans to hold off on this Health Maintenance  Topic Date Due   Colonoscopy  10/10/2023   DTaP/Tdap/Td (3 - Tdap) 11/14/2023   Influenza Vaccine  11/18/2023   COVID-19 Vaccine (6 - 2025-26 season) 12/19/2023   Medicare Annual Wellness (AWV)  02/20/2025   Pneumococcal Vaccine: 50+ Years  Completed   DEXA SCAN  Completed   Hepatitis C Screening  Completed   Zoster Vaccines- Shingrix  Completed   Meningococcal B Vaccine  Aged Out   Mammogram  Discontinued      Education and counseling on the following was provided based on the above review of health and a plan/checklist for the patient, along with additional information discussed, was provided for the patient in the patient instructions :   -Provided counseling and plan for increased risk of falling if applicable per above screening. Reviewed and demonstrated safe balance exercises that can be done at home to improve balance and discussed exercise guidelines for adults with include balance  exercises at least 3 days per week.  -Advised and counseled on a healthy lifestyle - including the importance of a healthy diet, regular physical activity, social connections and stress management. -Reviewed patient's current diet. Advised and counseled on a whole foods based healthy diet. Discussed replacing ultraprocessed grains with whole grains, increasing veggie serving, eliminating ultra-processed beveraged, etc.  A summary of a healthy diet was provided in the Patient Instructions.  -reviewed patient's current physical activity level and discussed exercise guidelines for adults. Discussed community resources and ideas for safe exercise to assist in meeting exercise guideline recommendations in a safe and healthy way. She plans to get back into [facility]. -reports has visit with Dr. Charlett to go over labs soon.  -Advise yearly dental visits at minimum and regular eye exams -Advised and counseled on alcohol safe limits, risks  Follow up: see patient instructions     Patient  Instructions  I really enjoyed getting to talk with you today! I am available on Tuesdays and Thursdays for virtual visits if you have any questions or concerns, or if I can be of any further assistance.   CHECKLIST FROM ANNUAL WELLNESS VISIT:  -Follow up (please call to schedule if not scheduled after visit):   -yearly for annual wellness visit with primary care office  Here is a list of your preventive care/health maintenance measures and the plan for each if any are due:  PLAN For any measures below that may be due:    1. Please schedule colonoscopy - call [phone]   2. Can get vaccines at the pharmacy  Health Maintenance  Topic Date Due   Colonoscopy  10/10/2023   DTaP/Tdap/Td (3 - Tdap) 11/14/2023   Influenza Vaccine  11/18/2023   COVID-19 Vaccine (6 - 2025-26 season) 12/19/2023   Medicare Annual Wellness (AWV)  02/20/2025   Pneumococcal Vaccine: 50+ Years  Completed   DEXA SCAN  Completed    Hepatitis C Screening  Completed   Zoster Vaccines- Shingrix  Completed   Meningococcal B Vaccine  Aged Out   Mammogram  Discontinued    -See a dentist at least yearly  -Get your eyes checked and then per your eye specialist's recommendations  -Other issues addressed today:   -I have included below further information regarding a healthy whole foods based diet, physical activity guidelines for adults, stress management and opportunities for social connections. I hope you find this information useful.   -----------------------------------------------------------------------------------------------------------------------------------------------------------------------------------------------------------------------------------------------------------    NUTRITION: -eat real food: lots of colorful vegetables (half the plate) and fruits -5-7 servings of vegetables and fruits per day (fresh or steamed is best), exp. 2 servings of vegetables with lunch and dinner and 2 servings of fruit per day. Berries and greens such as kale and collards are great choices.  -consume on a regular basis:  fresh fruits, fresh veggies, fish, nuts, seeds, healthy oils (such as olive oil, avocado oil), whole grains (make sure for bread/pasta/crackers/etc., that the first ingredient on label contains the word whole), legumes. -can eat small amounts of dairy and lean meat (no larger than the palm of your hand), but avoid processed meats such as ham, bacon, lunch meat, etc. -drink water -try to avoid fast food and pre-packaged foods, processed meat, ultra processed foods/beverages (donuts, candy, etc.) -most experts advise limiting sodium to < 2300mg  per day, should limit further is any chronic conditions such as high blood pressure, heart disease, diabetes, etc. The American Heart Association advised that < 1500mg  is is ideal -try to avoid foods/beverages that contain any ingredients with names you do not recognize   -try to avoid foods/beverages  with added sugar or sweeteners/sweets  -try to avoid sweet drinks (including diet drinks): soda, juice, Gatorade, sweet tea, power drinks, diet drinks -try to avoid white rice, white bread, pasta (unless whole grain)  EXERCISE GUIDELINES FOR ADULTS: -if you wish to increase your physical activity, do so gradually and with the approval of your doctor -STOP and seek medical care immediately if you have any chest pain, chest discomfort or trouble breathing when starting or increasing exercise  -move and stretch your body, legs, feet and arms when sitting for long periods -Physical activity guidelines for optimal health in adults: -get at least 150 minutes per week of moderate exercise (can talk, but not sing); this is about 20-30 minutes of sustained activity 5-7 days per week or two 10-15 minute episodes of sustained activity 5-7  days per week -do some muscle building/resistance training/strength training at least 2 days per week  -balance exercises 3+ days per week:   Stand somewhere where you have something sturdy to hold onto if you lose balance    1) lift up on toes, then back down, start with 5x per day and work up to 20x   2) stand and lift one leg straight out to the side so that foot is a few inches of the floor, start with 5x each side and work up to 20x each side   3) stand on one foot, start with 5 seconds each side and work up to 20 seconds on each side  If you need ideas or help with getting more active:  -Silver sneakers https://tools.silversneakers.com  -Walk with a Doc: Http://www.duncan-williams.com/  -try to include resistance (weight lifting/strength building) and balance exercises twice per week: or the following link for ideas: http://castillo-powell.com/  buyducts.dk  STRESS MANAGEMENT: -can try meditating, or just sitting quietly with deep breathing  while intentionally relaxing all parts of your body for 5 minutes daily -if you need further help with stress, anxiety or depression please follow up with your primary doctor or contact the wonderful folks at [facility]: [phone]  SOCIAL CONNECTIONS: -options in [place] if you wish to engage in more social and exercise related activities:  -Silver sneakers https://tools.silversneakers.com  -Walk with a Doc: Http://www.duncan-williams.com/  -Check out the [facility] Active Adults 50+ section on the [place] of [employer] (hiking clubs, book clubs, cards and games, chess, exercise classes, aquatic classes and much more) - see the website for details: https://www.[place].gov/departments/parks-recreation/active-adults50  -YouTube has lots of exercise videos for different ages and abilities as well  -[facility] (a variety of indoor and outdoor inperson activities for adults). [phone]. [address]  -Virtual Online Classes (a variety of topics): see seniorplanet.org or call [phone]  -consider volunteering at a school, hospice center, church, senior center or elsewhere            Chiquita JONELLE Cramp, DO

## 2024-02-21 NOTE — Patient Instructions (Addendum)
 I really enjoyed getting to talk with you today! I am available on Tuesdays and Thursdays for virtual visits if you have any questions or concerns, or if I can be of any further assistance.   CHECKLIST FROM ANNUAL WELLNESS VISIT:  -Follow up (please call to schedule if not scheduled after visit):   -yearly for annual wellness visit with primary care office  Here is a list of your preventive care/health maintenance measures and the plan for each if any are due:  PLAN For any measures below that may be due:    1. Please schedule colonoscopy - call [phone]   2. Can get vaccines at the pharmacy  Health Maintenance  Topic Date Due   Colonoscopy  10/10/2023   DTaP/Tdap/Td (3 - Tdap) 11/14/2023   Influenza Vaccine  11/18/2023   COVID-19 Vaccine (6 - 2025-26 season) 12/19/2023   Medicare Annual Wellness (AWV)  02/20/2025   Pneumococcal Vaccine: 50+ Years  Completed   DEXA SCAN  Completed   Hepatitis C Screening  Completed   Zoster Vaccines- Shingrix  Completed   Meningococcal B Vaccine  Aged Out   Mammogram  Discontinued    -See a dentist at least yearly  -Get your eyes checked and then per your eye specialist's recommendations  -Other issues addressed today:   -I have included below further information regarding a healthy whole foods based diet, physical activity guidelines for adults, stress management and opportunities for social connections. I hope you find this information useful.   -----------------------------------------------------------------------------------------------------------------------------------------------------------------------------------------------------------------------------------------------------------    NUTRITION: -eat real food: lots of colorful vegetables (half the plate) and fruits -5-7 servings of vegetables and fruits per day (fresh or steamed is best), exp. 2 servings of vegetables with lunch and dinner and 2 servings of fruit per  day. Berries and greens such as kale and collards are great choices.  -consume on a regular basis:  fresh fruits, fresh veggies, fish, nuts, seeds, healthy oils (such as olive oil, avocado oil), whole grains (make sure for bread/pasta/crackers/etc., that the first ingredient on label contains the word whole), legumes. -can eat small amounts of dairy and lean meat (no larger than the palm of your hand), but avoid processed meats such as ham, bacon, lunch meat, etc. -drink water -try to avoid fast food and pre-packaged foods, processed meat, ultra processed foods/beverages (donuts, candy, etc.) -most experts advise limiting sodium to < 2300mg  per day, should limit further is any chronic conditions such as high blood pressure, heart disease, diabetes, etc. The American Heart Association advised that < 1500mg  is is ideal -try to avoid foods/beverages that contain any ingredients with names you do not recognize  -try to avoid foods/beverages  with added sugar or sweeteners/sweets  -try to avoid sweet drinks (including diet drinks): soda, juice, Gatorade, sweet tea, power drinks, diet drinks -try to avoid white rice, white bread, pasta (unless whole grain)  EXERCISE GUIDELINES FOR ADULTS: -if you wish to increase your physical activity, do so gradually and with the approval of your doctor -STOP and seek medical care immediately if you have any chest pain, chest discomfort or trouble breathing when starting or increasing exercise  -move and stretch your body, legs, feet and arms when sitting for long periods -Physical activity guidelines for optimal health in adults: -get at least 150 minutes per week of moderate exercise (can talk, but not sing); this is about 20-30 minutes of sustained activity 5-7 days per week or two 10-15 minute episodes of sustained activity 5-7 days per week -  do some muscle building/resistance training/strength training at least 2 days per week  -balance exercises 3+ days per  week:   Stand somewhere where you have something sturdy to hold onto if you lose balance    1) lift up on toes, then back down, start with 5x per day and work up to 20x   2) stand and lift one leg straight out to the side so that foot is a few inches of the floor, start with 5x each side and work up to 20x each side   3) stand on one foot, start with 5 seconds each side and work up to 20 seconds on each side  If you need ideas or help with getting more active:  -Silver sneakers https://tools.silversneakers.com  -Walk with a Doc: Http://www.duncan-williams.com/  -try to include resistance (weight lifting/strength building) and balance exercises twice per week: or the following link for ideas: http://castillo-powell.com/  buyducts.dk  STRESS MANAGEMENT: -can try meditating, or just sitting quietly with deep breathing while intentionally relaxing all parts of your body for 5 minutes daily -if you need further help with stress, anxiety or depression please follow up with your primary doctor or contact the wonderful folks at [facility]: [phone]  SOCIAL CONNECTIONS: -options in [place] if you wish to engage in more social and exercise related activities:  -Silver sneakers https://tools.silversneakers.com  -Walk with a Doc: Http://www.duncan-williams.com/  -Check out the [facility] Active Adults 50+ section on the [place] of [employer] (hiking clubs, book clubs, cards and games, chess, exercise classes, aquatic classes and much more) - see the website for details: https://www.[place].gov/departments/parks-recreation/active-adults50  -YouTube has lots of exercise videos for different ages and abilities as well  -[facility] (a variety of indoor and outdoor inperson activities for adults). [phone]. [address]  -Virtual Online Classes (a variety of  topics): see seniorplanet.org or call [phone]  -consider volunteering at a school, hospice center, church, senior center or elsewhere

## 2024-03-13 ENCOUNTER — Encounter: Payer: Self-pay | Admitting: Internal Medicine

## 2024-03-13 ENCOUNTER — Ambulatory Visit (INDEPENDENT_AMBULATORY_CARE_PROVIDER_SITE_OTHER): Admitting: Internal Medicine

## 2024-03-13 VITALS — BP 130/78 | HR 64 | Temp 97.8°F | Ht 65.16 in | Wt 160.6 lb

## 2024-03-13 DIAGNOSIS — Z0000 Encounter for general adult medical examination without abnormal findings: Secondary | ICD-10-CM | POA: Diagnosis not present

## 2024-03-13 DIAGNOSIS — G479 Sleep disorder, unspecified: Secondary | ICD-10-CM

## 2024-03-13 DIAGNOSIS — I10 Essential (primary) hypertension: Secondary | ICD-10-CM

## 2024-03-13 DIAGNOSIS — Z79899 Other long term (current) drug therapy: Secondary | ICD-10-CM | POA: Diagnosis not present

## 2024-03-13 DIAGNOSIS — R208 Other disturbances of skin sensation: Secondary | ICD-10-CM | POA: Diagnosis not present

## 2024-03-13 DIAGNOSIS — G629 Polyneuropathy, unspecified: Secondary | ICD-10-CM

## 2024-03-13 DIAGNOSIS — R7989 Other specified abnormal findings of blood chemistry: Secondary | ICD-10-CM

## 2024-03-13 DIAGNOSIS — E785 Hyperlipidemia, unspecified: Secondary | ICD-10-CM

## 2024-03-13 DIAGNOSIS — K5080 Crohn's disease of both small and large intestine without complications: Secondary | ICD-10-CM

## 2024-03-13 LAB — TSH: TSH: 6.01 u[IU]/mL — ABNORMAL HIGH (ref 0.35–5.50)

## 2024-03-13 LAB — T4, FREE: Free T4: 0.51 ng/dL — ABNORMAL LOW (ref 0.60–1.60)

## 2024-03-13 MED ORDER — CITALOPRAM HYDROBROMIDE 40 MG PO TABS: 20.0000 mg | ORAL_TABLET | Freq: Every day | ORAL | 1 refills | Status: DC

## 2024-03-13 MED ORDER — TRAZODONE HCL 50 MG PO TABS: ORAL_TABLET | ORAL | 1 refills | Status: AC

## 2024-03-13 MED ORDER — VALACYCLOVIR HCL 1 G PO TABS: 2000.0000 mg | ORAL_TABLET | Freq: Two times a day (BID) | ORAL | 3 refills | Status: AC | PRN

## 2024-03-13 MED ORDER — IPRATROPIUM BROMIDE 0.06 % NA SOLN: NASAL | 99 refills | Status: AC

## 2024-03-13 NOTE — Progress Notes (Signed)
 Chief Complaint  Patient presents with   Annual Exam    HPI: Patient  Bridget Reyes  78 y.o. comes in today for Preventive Health Care visit  And med fu   See note  October  BP readings   log today diastolic's in 50s  systolic 114-150  most in low 869d   Citalopram  20  decreasing   not  controlling mood   weight still a problem can she take weight loss med? Had lab fu  Stressful adding  Preservision   and random Multi vits. Feet sx like neuropathy for years and  getting worse      Health Maintenance  Topic Date Due   Colonoscopy  05/13/2024 (Originally 10/10/2023)   DTaP/Tdap/Td (3 - Tdap) 12/11/2024 (Originally 11/14/2023)   COVID-19 Vaccine (7 - Pfizer risk 2025-26 season) 07/15/2024   Medicare Annual Wellness (AWV)  02/20/2025   Pneumococcal Vaccine: 50+ Years  Completed   Influenza Vaccine  Completed   Bone Density Scan  Completed   Hepatitis C Screening  Completed   Zoster Vaccines- Shingrix  Completed   Meningococcal B Vaccine  Aged Out   Mammogram  Discontinued   Health Maintenance Review LIFESTYLE:  Exercise:  as possible   Tobacco/ETS:n Alcohol:  Sugar beverages: Sleep: not considtent good   recluctant to take trazodone  every night to avoid  dependence for  sleep  Drug use: no HH of 2    ROS:  GEN/ HEENT: No fever, significant weight changes sweats headaches vision problems hearing changes, CV/ PULM; No chest pain shortness of breath cough, syncope,edema  change in exercise tolerance. GI /GU: No adominal pain, vomiting, change in bowel habits. No blood in the stool. No significant GU symptoms. SKIN/HEME: ,no acute skin rashes suspicious lesions or bleeding. No lymphadenopathy, nodules, masses.  NEURO/ PSYCH:  No neurologic signs such as weakness numbness. No depression anxiety. IMM/ Allergy: No unusual infections.  Allergy .   REST of 12 system review negative except as per HPI   Past Medical History:  Diagnosis Date   Allergy    Anxiety    not  treated    Arthritis    knees, neck - no meds   Blood transfusion without reported diagnosis    Cataract    bilateral - MD just watchning   Colon polyps    Crohn's    Diverticulosis    Eczema    Esophageal reflux    Gastritis    Gout    Hiatal hernia    Hyperlipemia    Hypertension    Ileitis    Migraines    tx with naproxen - last one 4 yrs ago   Rosacea    Skin cancer of face    scca removed     Past Surgical History:  Procedure Laterality Date   ABDOMINAL HYSTERECTOMY     BREAST BIOPSY Left 1980   CATARACT EXTRACTION, BILATERAL  01/2022   COLONOSCOPY  08/2015   Pyrtle -polyp   COSMETIC SURGERY     DILATION AND CURETTAGE OF UTERUS     POLYPECTOMY     TONSILLECTOMY     TOTAL VAGINAL HYSTERECTOMY  1994   UPPER GASTROINTESTINAL ENDOSCOPY     UPPER GI ENDOSCOPY     hiatal hernia   WISDOM TOOTH EXTRACTION      Family History  Problem Relation Age of Onset   Parkinsonism Mother    Lung cancer Mother    Heart disease Father  died chf in 25 s   Hypertension Father    Congestive Heart Failure Father    Colon polyps Sister    Breast cancer Daughter    Breast cancer Paternal Aunt    Breast cancer Cousin    Colon cancer Neg Hx    Esophageal cancer Neg Hx    Rectal cancer Neg Hx    Stomach cancer Neg Hx     Social History   Socioeconomic History   Marital status: Married    Spouse name: Not on file   Number of children: 2   Years of education: Not on file   Highest education level: Bachelor's degree (e.g., BA, AB, BS)  Occupational History   Occupation: retired     Comment: Advertising Account Executive: UNEMPLOYED  Tobacco Use   Smoking status: Never   Smokeless tobacco: Never  Vaping Use   Vaping status: Never Used  Substance and Sexual Activity   Alcohol use: Yes    Alcohol/week: 1.0 - 2.0 standard drink of alcohol    Types: 1 - 2 Glasses of wine per week   Drug use: No   Sexual activity: Yes    Partners: Male    Birth control/protection:  Surgical    Comment: hysterectomy  Other Topics Concern   Not on file  Social History Narrative   hh  Of 2       Dog    Married    G2P2   Social Drivers of Health   Financial Resource Strain: Low Risk  (01/24/2024)   Overall Financial Resource Strain (CARDIA)    Difficulty of Paying Living Expenses: Not hard at all  Food Insecurity: No Food Insecurity (01/24/2024)   Hunger Vital Sign    Worried About Running Out of Food in the Last Year: Never true    Ran Out of Food in the Last Year: Never true  Transportation Needs: No Transportation Needs (01/24/2024)   PRAPARE - Administrator, Civil Service (Medical): No    Lack of Transportation (Non-Medical): No  Physical Activity: Insufficiently Active (02/21/2024)   Exercise Vital Sign    Days of Exercise per Week: 3 days    Minutes of Exercise per Session: 30 min  Stress: Stress Concern Present (02/21/2024)   [employer] of Occupational Health - Occupational Stress Questionnaire    Feeling of Stress: To some extent  Social Connections: Socially Integrated (02/21/2024)   Social Connection and Isolation Panel    Frequency of Communication with Friends and Family: More than three times a week    Frequency of Social Gatherings with Friends and Family: Three times a week    Attends Religious Services: More than 4 times per year    Active Member of Clubs or Organizations: Yes    Attends Banker Meetings: More than 4 times per year    Marital Status: Married    Outpatient Medications Prior to Visit  Medication Sig Dispense Refill   acetaminophen (TYLENOL) 500 MG tablet Take 500 mg by mouth every 6 (six) hours as needed.     amLODipine  (NORVASC ) 5 MG tablet Take 1 tablet (5 mg total) by mouth daily. 30 tablet 3   mesalamine  (PENTASA ) 500 MG CR capsule TAKE 3 CAPSULES BY MOUTH TWICE DAILY 540 capsule 0   MULTIPLE VITAMIN PO Take 1 tablet by mouth daily.     Multiple Vitamins-Minerals (PRESERVISION AREDS 2 PO)  Take 1 tablet by mouth in the morning and at bedtime.  omeprazole  (PRILOSEC) 40 MG capsule Take 40 mg by mouth daily.     OVER THE COUNTER MEDICATION in the morning and at bedtime. Focus Select for eye vitamins and supplements.     simvastatin  (ZOCOR ) 40 MG tablet TAKE 1 TABLET(40 MG) BY MOUTH DAILY 90 tablet 3   citalopram  (CELEXA ) 40 MG tablet Take 0.5 tablets (20 mg total) by mouth daily. Dosage change 90 tablet 1   ipratropium (ATROVENT ) 0.06 % nasal spray USE 2 SPRAYS IN EACH NOSTRIL TWICE TO THREE TIMES DAILY. 45 mL prn   traZODone  (DESYREL ) 50 MG tablet TAKE 1/2 TO 1 TABLET(25 TO 50 MG) BY MOUTH AT BEDTIME AS NEEDED FOR SLEEP 30 tablet 1   valACYclovir  (VALTREX ) 1000 MG tablet Take 2 tablets (2,000 mg total) by mouth 2 (two) times daily as needed. 90 tablet 3   NON FORMULARY as needed. CBD Cream- applies prn to muscles, joints (Patient not taking: Reported on 03/13/2024)     No facility-administered medications prior to visit.     EXAM:  BP 130/78 (BP Location: Left Arm, Patient Position: Sitting, Cuff Size: Normal)   Pulse 64   Temp 97.8 F (36.6 C) (Oral)   Ht 5' 5.16 (1.655 m)   Wt 160 lb 9.6 oz (72.8 kg)   LMP 04/19/1992   SpO2 100%   BMI 26.60 kg/m   Body mass index is 26.6 kg/m. Wt Readings from Last 3 Encounters:  03/13/24 160 lb 9.6 oz (72.8 kg)  01/24/24 158 lb 6.4 oz (71.8 kg)  12/20/23 161 lb 14.4 oz (73.4 kg)    Physical Exam: Vital signs reviewed HZW:Uypd is a well-developed well-nourished alert cooperative    who appearsr stated age in no acute distress.  HEENT: normocephalic atraumatic , Eyes: PERRL EOM's full, conjunctiva clear, Nares: paten,t no deformity discharge or tenderness., Ears: no deformity EAC's clear TMs with normal landmarks. Mouth: clear OP, no lesions, edema.  Moist mucous membranes. Dentition in adequate repair. NECK: supple without masses, thyromegaly or bruits. CHEST/PULM:  Clear to auscultation and percussion breath sounds equal no  wheeze , rales or rhonchi. No chest wall deformities or tenderness. Breast: normal by inspection . No dimpling, discharge, masses, tenderness or discharge . CV: PMI is nondisplaced, S1 S2 no gallops, murmurs, rubs. Peripheral pulses hard to feel right foot  nl cap refill  ABDOMEN: Bowel sounds normal nontender  No guard or rebound, no hepato splenomegal no CVA tenderness.   Extremtities:  No clubbing cyanosis or edema, no acute joint swelling or redness no focal atrophy NEURO:  Oriented x3, cranial nerves 3-12 appear to be intact, no obvious focal weakness,gait within normal limits no abnormal reflexes or asymmetrical SKIN: No acute rashes normal turgor, color, no bruising or petechiae. PSYCH: Oriented, good eye contact, no obvious depression anxiety, cognition and judgment appear normal. LN: no cervical axillary  adenopathy  Lab Results  Component Value Date   WBC 5.3 02/15/2024   HGB 13.5 02/15/2024   HCT 39.9 02/15/2024   PLT 234.0 02/15/2024   GLUCOSE 87 02/15/2024   CHOL 157 02/15/2024   TRIG 125.0 02/15/2024   HDL 39.90 02/15/2024   LDLCALC 93 02/15/2024   ALT 18 02/15/2024   AST 17 02/15/2024   NA 137 02/15/2024   K 4.1 02/15/2024   CL 101 02/15/2024   CREATININE 0.80 02/15/2024   BUN 20 02/15/2024   CO2 30 02/15/2024   TSH 6.01 (H) 03/13/2024   HGBA1C 5.9 02/15/2024   MICROALBUR <0.7 02/15/2024  BP Readings from Last 3 Encounters:  03/13/24 130/78  01/24/24 (!) 146/56  12/20/23 (!) 168/60   Lab Results  Component Value Date   VITAMINB12 313 02/15/2024     Lab results  reviewed with patient   ASSESSMENT AND PLAN:  Discussed the following assessment and plan:    ICD-10-CM   1. Visit for preventive health examination  Z00.00 citalopram  (CELEXA ) 40 MG tablet    ipratropium (ATROVENT ) 0.06 % nasal spray    traZODone  (DESYREL ) 50 MG tablet    valACYclovir  (VALTREX ) 1000 MG tablet    2. Essential hypertension  I10 citalopram  (CELEXA ) 40 MG tablet     ipratropium (ATROVENT ) 0.06 % nasal spray    traZODone  (DESYREL ) 50 MG tablet    valACYclovir  (VALTREX ) 1000 MG tablet    TSH    T4, Free    Thyroid  peroxidase antibody    3. Burning sensation of feet  R20.8 Ambulatory referral to Neurology    VAS US  ABI WITH/WO TBI    4. Medication management  Z79.899 citalopram  (CELEXA ) 40 MG tablet    ipratropium (ATROVENT ) 0.06 % nasal spray    traZODone  (DESYREL ) 50 MG tablet    valACYclovir  (VALTREX ) 1000 MG tablet    TSH    T4, Free    Thyroid  peroxidase antibody    5. Disturbance in sleep behavior  G47.9     6. Abnormal thyroid  blood test  R79.89 TSH    T4, Free    Thyroid  peroxidase antibody    7. Hyperlipidemia, unspecified hyperlipidemia type  E78.5     8. Crohn's disease of both small and large intestine without complication (HCC)  K50.80    under gi care  at risk for poor absorption    9. Neuropathy  G62.9 Ambulatory referral to Neurology    VAS US  ABI WITH/WO TBI      B12 333 low normal  increase and take supplement 1000 at least  Nl tsh but free t4 slightly low    needs fu  could have mild subclinical hypothyroid adding to situation.  Plan neuro consult Plan abi or arterial  eval circulation LE  Sleep : need to optimize trazodone  before moving on  to other sleep options  consider genesight testing   Return 2  mos depending.  Patient Care Team: Jeryl Wilbourn, Apolinar POUR, MD as PCP - General Arnaldo Juliene RAMAN, MD (Family Medicine) Jordan, Amy, MD (Dermatology) Cleotilde Ronal RAMAN, MD (Gynecology) Oneita Na, MD as Referring Physician (Specialist) Patrcia Sharper, MD (Ophthalmology) Mable Lenis, MD (Inactive) as Consulting Physician (Otolaryngology) Cleotilde Sewer, OD (Optometry) Pyrtle, Gordy HERO, MD as Consulting Physician (Gastroenterology) Patient Instructions  Try taking trazodone   for 5 days in a row and then as needed to see if helpful . Consider   different med in future  consider gene testing for best meds for mood   Repeat thyroid  tests today . Neuro consult about the feet pain  Also circulation artery tests of legs . Plan fu   and  Many causes of fatigue   if bp below 140/90 ok at this time .   We can add low dose valsartan .  Take vitamin with b12 1000 mcg in it  or similar   1-2 mos fu . About all of above  Karisma Meiser K. Sherilee Smotherman M.D.

## 2024-03-13 NOTE — Patient Instructions (Addendum)
 Try taking trazodone   for 5 days in a row and then as needed to see if helpful . Consider   different med in future  consider gene testing for best meds for mood  Repeat thyroid  tests today . Neuro consult about the feet pain  Also circulation artery tests of legs . Plan fu   and  Many causes of fatigue   if bp below 140/90 ok at this time .   We can add low dose valsartan .  Take vitamin with b12 1000 mcg in it  or similar   1-2 mos fu . About all of above

## 2024-03-14 ENCOUNTER — Other Ambulatory Visit: Payer: Self-pay | Admitting: Internal Medicine

## 2024-03-14 ENCOUNTER — Ambulatory Visit: Payer: Self-pay | Admitting: Internal Medicine

## 2024-03-14 DIAGNOSIS — I739 Peripheral vascular disease, unspecified: Secondary | ICD-10-CM

## 2024-03-14 DIAGNOSIS — R7989 Other specified abnormal findings of blood chemistry: Secondary | ICD-10-CM

## 2024-03-14 MED ORDER — LEVOTHYROXINE SODIUM 50 MCG PO TABS: 50.0000 ug | ORAL_TABLET | Freq: Every day | ORAL | 1 refills | Status: DC

## 2024-03-14 NOTE — Progress Notes (Signed)
 Thyroid  test so far are still off  ; you may be helped by adding a thyroid  medication   Awaiting  the thyroid  antibody studies . Please send in levothyroxine   50 mcg to take daily disp 30 refill x 1    Refer to endocrinology  for abnormal thyroid  tests.

## 2024-03-15 ENCOUNTER — Other Ambulatory Visit: Payer: Self-pay | Admitting: Family Medicine

## 2024-03-19 ENCOUNTER — Ambulatory Visit (HOSPITAL_COMMUNITY)
Admission: RE | Admit: 2024-03-19 | Discharge: 2024-03-19 | Disposition: A | Discharge status: Home / Self Care | Source: Ambulatory Visit | Attending: Internal Medicine | Admitting: Internal Medicine

## 2024-03-19 DIAGNOSIS — G629 Polyneuropathy, unspecified: Secondary | ICD-10-CM | POA: Diagnosis not present

## 2024-03-19 DIAGNOSIS — R208 Other disturbances of skin sensation: Secondary | ICD-10-CM | POA: Diagnosis not present

## 2024-03-19 LAB — VAS US ABI WITH/WO TBI
Left ABI: 0.95
Right ABI: 0.53

## 2024-03-19 LAB — THYROID PEROXIDASE ANTIBODY: Thyroperoxidase Ab SerPl-aCnc: 1 [IU]/mL (ref <9)

## 2024-03-20 NOTE — Progress Notes (Signed)
 So the artery circulation test shows  decrease blood flow  to the right  leg . I advise a referral to the vascular specialist . Please do the referral for   PVD to vascular surgery

## 2024-03-27 ENCOUNTER — Other Ambulatory Visit: Payer: Self-pay | Admitting: Nurse Practitioner

## 2024-04-04 ENCOUNTER — Other Ambulatory Visit: Payer: Self-pay | Admitting: Nurse Practitioner

## 2024-04-23 ENCOUNTER — Encounter: Payer: Self-pay | Admitting: Surgery

## 2024-04-23 ENCOUNTER — Ambulatory Visit: Attending: Surgery | Admitting: Surgery

## 2024-04-23 VITALS — BP 148/73 | HR 68 | Temp 98.0°F | Ht 65.0 in | Wt 162.0 lb

## 2024-04-23 DIAGNOSIS — I70211 Atherosclerosis of native arteries of extremities with intermittent claudication, right leg: Secondary | ICD-10-CM | POA: Diagnosis not present

## 2024-04-23 NOTE — Progress Notes (Signed)
 "                                   [facility] [place]  Patient name: Bridget Reyes MRN: [id number] DOB: [birth year] Sex: female   REQUESTING PROVIDER:    Dr. Charlett   REASON FOR CONSULT:    PAD  HISTORY OF PRESENT ILLNESS:   Bridget Reyes is a 79 y.o. female, who is referred for evaluation of peripheral vascular disease.  She states that for approximately 10 years she has been having burning and tingling in both of her feet.  She also will have her toes cramp at night.  She will occasionally get leg swelling.  When she wears compression stockings, she will get pain behind her knee.  She does not endorse any significant cramping or weakness with walking.  She just cannot walk much because of the burning in her feet.  She does not have any open wounds.  She does suffer from Crohn's disease.  She is on a statin for hypercholesterolemia.  She is medically managed for hypertension.  She is a non-smoker  PAST MEDICAL HISTORY    Past Medical History:  Diagnosis Date   Allergy    Anxiety    not treated    Arthritis    knees, neck - no meds   Blood transfusion without reported diagnosis    Cataract    bilateral - MD just watchning   Colon polyps    Crohn's    Diverticulosis    Eczema    Esophageal reflux    Gastritis    Gout    Hiatal hernia    Hyperlipemia    Hypertension    Ileitis    Migraines    tx with naproxen - last one 4 yrs ago   Rosacea    Skin cancer of face    scca removed      FAMILY HISTORY   Family History  Problem Relation Age of Onset   Parkinsonism Mother    Lung cancer Mother    Heart disease Father        died chf in 61 s   Hypertension Father    Congestive Heart Failure Father    Colon polyps Sister    Breast cancer Daughter    Breast cancer Paternal Aunt    Breast cancer Cousin    Colon cancer Neg Hx    Esophageal cancer Neg Hx    Rectal cancer Neg Hx    Stomach cancer Neg Hx     SOCIAL HISTORY:    Social History   Socioeconomic History   Marital status: Married    Spouse name: Not on file   Number of children: 2   Years of education: Not on file   Highest education level: Bachelor's degree (e.g., BA, AB, BS)  Occupational History   Occupation: retired     Comment: Advertising Account Executive: UNEMPLOYED  Tobacco Use   Smoking status: Never   Smokeless tobacco: Never  Vaping Use   Vaping status: Never Used  Substance and Sexual Activity   Alcohol use: Yes    Alcohol/week: 1.0 - 2.0 standard drink of alcohol    Types: 1 - 2 Glasses of wine per week   Drug use: No   Sexual activity: Yes    Partners: Male    Birth control/protection: Surgical    Comment: hysterectomy  Other Topics Concern   Not on file  Social History Narrative   hh  Of 2       Dog    Married    G2P2   Social Drivers of Health   Tobacco Use: Low Risk (04/23/2024)   Patient History    Smoking Tobacco Use: Never    Smokeless Tobacco Use: Never    Passive Exposure: Not on file  Financial Resource Strain: Low Risk (01/24/2024)   Overall Financial Resource Strain (CARDIA)    Difficulty of Paying Living Expenses: Not hard at all  Food Insecurity: No Food Insecurity (01/24/2024)   Epic    Worried About Programme Researcher, Broadcasting/film/video in the Last Year: Never true    Ran Out of Food in the Last Year: Never true  Transportation Needs: No Transportation Needs (01/24/2024)   Epic    Lack of Transportation (Medical): No    Lack of Transportation (Non-Medical): No  Physical Activity: Insufficiently Active (02/21/2024)   Exercise Vital Sign    Days of Exercise per Week: 3 days    Minutes of Exercise per Session: 30 min  Stress: Stress Concern Present (02/21/2024)   [employer] of Occupational Health - Occupational Stress Questionnaire    Feeling of Stress: To some extent  Social Connections: Socially Integrated (02/21/2024)   Social Connection and Isolation Panel    Frequency of Communication with Friends and  Family: More than three times a week    Frequency of Social Gatherings with Friends and Family: Three times a week    Attends Religious Services: More than 4 times per year    Active Member of Clubs or Organizations: Yes    Attends Banker Meetings: More than 4 times per year    Marital Status: Married  Catering Manager Violence: Not At Risk (12/14/2021)   Humiliation, Afraid, Rape, and Kick questionnaire    Fear of Current or Ex-Partner: No    Emotionally Abused: No    Physically Abused: No    Sexually Abused: No  Depression (PHQ2-9): Medium Risk (02/21/2024)   Depression (PHQ2-9)    PHQ-2 Score: 10  Alcohol Screen: Low Risk (01/24/2024)   Alcohol Screen    Last Alcohol Screening Score (AUDIT): 2  Housing: Low Risk (01/24/2024)   Epic    Unable to Pay for Housing in the Last Year: No    Number of Times Moved in the Last Year: 1    Homeless in the Last Year: No  Utilities: Not At Risk (03/03/2023)   AHC Utilities    Threatened with loss of utilities: No  Health Literacy: Adequate Health Literacy (02/21/2024)   B1300 Health Literacy    Frequency of need for help with medical instructions: Never    ALLERGIES:    Allergies[1]  CURRENT MEDICATIONS:    Current Outpatient Medications  Medication Sig Dispense Refill   acetaminophen (TYLENOL) 500 MG tablet Take 500 mg by mouth every 6 (six) hours as needed.     amLODipine  (NORVASC ) 5 MG tablet TAKE 1 TABLET(5 MG) BY MOUTH DAILY 30 tablet 3   citalopram  (CELEXA ) 40 MG tablet Take 0.5 tablets (20 mg total) by mouth daily. Dosage change 90 tablet 1   ipratropium (ATROVENT ) 0.06 % nasal spray USE 2 SPRAYS IN EACH NOSTRIL TWICE TO THREE TIMES DAILY. 45 mL prn   levothyroxine  (SYNTHROID ) 50 MCG tablet Take 1 tablet (50 mcg total) by mouth daily. 30 tablet 1   mesalamine  (PENTASA ) 500 MG CR capsule TAKE 3  CAPSULES BY MOUTH TWICE DAILY 540 capsule 2   MULTIPLE VITAMIN PO Take 1 tablet by mouth daily.     Multiple  Vitamins-Minerals (PRESERVISION AREDS 2 PO) Take 1 tablet by mouth in the morning and at bedtime.     NON FORMULARY as needed. CBD Cream- applies prn to muscles, joints     omeprazole  (PRILOSEC) 40 MG capsule TAKE 1 CAPSULE(40 MG) BY MOUTH DAILY 30 MINUTES BEFORE BREAKFAST 90 capsule 1   OVER THE COUNTER MEDICATION in the morning and at bedtime. Focus Select for eye vitamins and supplements.     simvastatin  (ZOCOR ) 40 MG tablet TAKE 1 TABLET(40 MG) BY MOUTH DAILY 90 tablet 3   traZODone  (DESYREL ) 50 MG tablet TAKE 1/2 TO 1 TABLET(25 TO 50 MG) BY MOUTH AT BEDTIME AS NEEDED FOR SLEEP 30 tablet 1   valACYclovir  (VALTREX ) 1000 MG tablet Take 2 tablets (2,000 mg total) by mouth 2 (two) times daily as needed. 90 tablet 3   No current facility-administered medications for this visit.    REVIEW OF SYSTEMS:   [X]  denotes positive finding, [ ]  denotes negative finding Cardiac  Comments:  Chest pain or chest pressure: Palpable left dorsalis pedis   Shortness of breath upon exertion:    Short of breath when lying flat:    Irregular heart rhythm:        Vascular    Pain in calf, thigh, or hip brought on by ambulation:    Pain in feet at night that wakes you up from your sleep:     Blood clot in your veins:    Leg swelling:         Pulmonary    Oxygen at home:    Productive cough:     Wheezing:         Neurologic    Sudden weakness in arms or legs:     Sudden numbness in arms or legs:     Sudden onset of difficulty speaking or slurred speech:    Temporary loss of vision in one eye:     Problems with dizziness:         Gastrointestinal    Blood in stool:      Vomited blood:         Genitourinary    Burning when urinating:     Blood in urine:        Psychiatric    Major depression:         Hematologic    Bleeding problems:    Problems with blood clotting too easily:        Skin    Rashes or ulcers:        Constitutional    Fever or chills:     PHYSICAL EXAM:   Vitals:    04/23/24 1049  BP: (!) 148/73  Pulse: 68  Temp: 98 F (36.7 C)  SpO2: 98%  Weight: 162 lb (73.5 kg)  Height: 5' 5 (1.651 m)    GENERAL: The patient is a well-nourished female, in no acute distress. The vital signs are documented above. CARDIAC: There is a regular rate and rhythm.  VASCULAR: Palpable left dorsalis pedis pulse, nonpalpable right.  Palpable femoral pulses PULMONARY: Nonlabored respirations ABDOMEN: Soft and non-tender   MUSCULOSKELETAL: There are no major deformities or cyanosis. NEUROLOGIC: No focal weakness or paresthesias are detected. SKIN: There are no ulcers or rashes noted. PSYCHIATRIC: The patient has a normal affect.  STUDIES:   I have reviewed the following: +-------+-----------+-----------+------------+------------+  ABI/TBIToday's ABIToday's TBIPrevious ABIPrevious TBI  +-------+-----------+-----------+------------+------------+  Right 0.53       0.17                                 +-------+-----------+-----------+------------+------------+  Left  0.95       0.63                                 +-------+-----------+-----------+------------+------------+  Right toe pressure: 26, monophasic Left toe pressure: 94, multiphasic  ASSESSMENT and PLAN   PAD: The patient has signs of arterial insufficiency on the right however these do not appear to be symptomatic.  I think mostly what she is having is neuropathy.  She is scheduled to see neurology in the near future.  At this point, I have recommended medical management.  Because of her Crohn's disease she does not want to go on antiplatelet therapy.  She should have her cholesterol Debby with a target LDL around 55.  She would also benefit from an exercise program.  I am making a referral to our Pharm.D. clinic to make sure that she has been medically optimized.  I have her scheduled for follow-up with me in 1 year.  She knows to call sooner if she has a change in symptoms or if she has a  nonhealing wound.   Malvina New, IV, MD, FACS [facility] [phone] Pager [phone]     [1]  Allergies Allergen Reactions   Sulfamethoxazole-Trimethoprim Hives    REACTION: rash   Amoxicillin     REACTION: unspecified   Bactrim [Sulfamethoxazole-Trimethoprim]    Penicillins Hives   Sertraline     Hair loss    Tetracaine Itching and Swelling   Topamax [Topiramate]     Hair loss   Remeron  [Mirtazapine ] Other (See Comments)    Increased ravenous appetite   "

## 2024-05-03 ENCOUNTER — Telehealth: Payer: Self-pay | Admitting: Nurse Practitioner

## 2024-05-03 NOTE — Telephone Encounter (Signed)
 Spoke with patient. Patient scheduled to see Dr Albertus Tue, 06/05/24 @ 8:50 AM.

## 2024-05-03 NOTE — Telephone Encounter (Signed)
 Inbound call from patient stating she would like to schedule an office visit with Dr.Pyrtle and has been unable to be seen by him for 3 yrs due to his schedule would like to know if nurse something on their end since she would like to be seen by doctor not a PA. Patient states she way overdue for her colonoscopy but wants a consultation with Dr.Pyrtle first  Requesting a call back  Please advise  Thank you

## 2024-05-04 ENCOUNTER — Telehealth: Payer: Self-pay

## 2024-05-04 DIAGNOSIS — I10 Essential (primary) hypertension: Secondary | ICD-10-CM

## 2024-05-04 DIAGNOSIS — Z79899 Other long term (current) drug therapy: Secondary | ICD-10-CM

## 2024-05-04 DIAGNOSIS — Z0000 Encounter for general adult medical examination without abnormal findings: Secondary | ICD-10-CM

## 2024-05-04 NOTE — Telephone Encounter (Signed)
 Copied from CRM [id number]. Topic: Clinical - Prescription Issue >> May 04, 2024  2:33 PM Chasity T wrote: Reason for CRM: patient is calling because she states her and dr charlett had an conversation about the medication citalopram  (CELEXA ) 40 MG tablet. She states they she advised that she needs to be on the full tablet of 40 mg and not half. Please contact her to discuss.   Phone number: [phone]

## 2024-05-07 NOTE — Progress Notes (Unsigned)
 " [facility] Pharmacist Note  Name: Bridget Reyes  MRN: [id number]  DOB: 1945/10/15  Sex: female PCP: Charlett Apolinar POUR, MD CPP Referral Provider: Dr. Serene  HISTORY OF PRESENT ILLNESS: Bridget Reyes is a 79 y.o. female with PMH atherosclerosis of native artery of right lower extremity with intermittent claudication who presents for medication management for cardiovascular risk reduction.   Dyslipidemia/ASCVD  Current lipid-lowering medications: simvastatin  40 mg daily  Previously tried medications/intolerances: none  Rx affordability and access: [employer] Medicare   Current antiplatelets/antithrombotics: none -not on aspirin due to Chron's disease  Hypertension Current medications: amlodipine  5 mg daily Previously tried medications/intolerances: none  Rx affordability and access: [facility] Medicare  Patient has a validated, automated, upper arm home BP cuff  Recent BP readings: 120s-130s/50s-60s   Patient denies hypotensive s/sx including dizziness, lightheadedness. Patient denies hypertensive symptoms including headache, chest pain, shortness of breath.   Current dietary habits:   Breakfast: homemade oatmeal, banana, blueberries  Lunch: rotisserie chicken on high fiber tortilla   Supper: grilled chicken, brown rice, veggies Snacks: crackers, pretzels Drinks: diet coke, diet tea, water  Has had increased hunger lately she attributes to changes in thyroid  Tries to follow weight watchers  Current physical activity: none currently due to neuropathy which makes walking painful, she wants to try the stationary bike  Patient is up to date on annual influenza vaccine.  Patient is up to date on COVID vaccines.   Past Medical History:  Diagnosis Date   Allergy    Anxiety    not treated    Arthritis    knees, neck - no meds   Blood transfusion without reported diagnosis    Cataract    bilateral - MD just watchning   Colon polyps    Crohn's    Diverticulosis    Eczema    Esophageal  reflux    Gastritis    Gout    Hiatal hernia    Hyperlipemia    Hypertension    Ileitis    Migraines    tx with naproxen - last one 4 yrs ago   Rosacea    Skin cancer of face    scca removed    Past Surgical History:  Procedure Laterality Date   ABDOMINAL HYSTERECTOMY     BREAST BIOPSY Left 1980   CATARACT EXTRACTION, BILATERAL  01/2022   COLONOSCOPY  08/2015   Pyrtle -polyp   COSMETIC SURGERY     DILATION AND CURETTAGE OF UTERUS     POLYPECTOMY     TONSILLECTOMY     TOTAL VAGINAL HYSTERECTOMY  1994   UPPER GASTROINTESTINAL ENDOSCOPY     UPPER GI ENDOSCOPY     hiatal hernia   WISDOM TOOTH EXTRACTION     Family History  Problem Relation Age of Onset   Parkinsonism Mother    Lung cancer Mother    Heart disease Father        died chf in 58 s   Hypertension Father    Congestive Heart Failure Father    Colon polyps Sister    Breast cancer Daughter    Breast cancer Paternal Aunt    Breast cancer Cousin    Colon cancer Neg Hx    Esophageal cancer Neg Hx    Rectal cancer Neg Hx    Stomach cancer Neg Hx    LABS: Lab Results  Component Value Date   CHOL 157 02/15/2024   HDL 39.90 02/15/2024   LDLCALC 93 02/15/2024   TRIG  125.0 02/15/2024   CHOLHDL 4 02/15/2024    Lab Results  Component Value Date   CREATININE 0.80 02/15/2024   BUN 20 02/15/2024   NA 137 02/15/2024   K 4.1 02/15/2024   CL 101 02/15/2024   CO2 30 02/15/2024   CrCl cannot be calculated (Patient's most recent lab result is older than the maximum 21 days allowed.).      Component Value Date/Time   PROT 7.0 02/15/2024 0818   ALBUMIN 4.5 02/15/2024 0818   AST 17 02/15/2024 0818   ALT 18 02/15/2024 0818   ALKPHOS 77 02/15/2024 0818   BILITOT 0.4 02/15/2024 0818   BILIDIR 0.1 02/15/2024 0818   IBILI 0.3 03/10/2020 0829    Lab Results  Component Value Date   HGBA1C 5.9 02/15/2024    ASSESSMENT & PLAN:  Dyslipidemia LDL above goal <55 mg/dL given PAD with intermittent claudication. Last  LDL was 93 mg/dL on 89/70/74. Will switch to high intensity statin. Stop simvastatin  and start rosuvastatin  40 mg daily Counseled patient on treatment, including efficacy, dosing, administration, possible adverse effects, and anticipated cost.  Reviewed long-term complications of uncontrolled cholesterol.  Reviewed goals for cholesterol readings with patient.  Reviewed dietary and lifestyle modifications to improve cholesterol.  Repeat lipid panel in 4-12 weeks.   Antiplatelets/Antithrombotics Patient with no recent revascularization.  Currently not on aspirin due to Chron's disease. She has an upcoming appointment with GI and plans to discuss starting aspirin 81 mg daily with them. Counseled patient on treatment, including efficacy, dosing, administration, possible adverse effects, and anticipated cost.   Hypertension BP close to goal <130/80 mmHg. Some home BP readings are at goal and some are slightly elevated.  Continue amlodipine  5 mg daily.   Counseled patient on treatment, including efficacy, dosing, administration, possible adverse effects, and anticipated cost.  Reviewed long-term outcomes of uncontrolled blood pressure.  Counseled on proper blood pressure monitoring technique and reviewed blood pressure goal. Instructed patient to check BP at home and bring log of readings to next visit.  Reviewed dietary and lifestyle modifications to improve blood pressure.    Follow up: She will return for repeat lipid panel and PharmD Clinic in 1-2 months  Izetta Henry, PharmD, CPP [facility] [phone] "

## 2024-05-08 ENCOUNTER — Ambulatory Visit: Attending: Surgery | Admitting: Pharmacist

## 2024-05-08 VITALS — BP 139/65 | HR 65 | Wt 164.0 lb

## 2024-05-08 DIAGNOSIS — I70211 Atherosclerosis of native arteries of extremities with intermittent claudication, right leg: Secondary | ICD-10-CM | POA: Diagnosis not present

## 2024-05-08 MED ORDER — ROSUVASTATIN CALCIUM 40 MG PO TABS: 40.0000 mg | ORAL_TABLET | Freq: Every day | ORAL | 3 refills | Status: AC

## 2024-05-08 NOTE — Patient Instructions (Signed)
-   Stop taking simvastatin  and start taking rosuvastatin  40 mg (1 tablet) daily. Your goal LDL (bad cholesterol) is less than 55 - Monitor your blood pressure at home. Your goal blood pressure is less than 130/80. Make sure you are seated and resting prior to checking blood pressure. - Please return anytime the week of March of 9th to have your cholesterol checked. The lab is on the first floor of this building and you do not need an appointment. We will discuss your results at your appointment the following week.

## 2024-05-09 MED ORDER — CITALOPRAM HYDROBROMIDE 40 MG PO TABS: 40.0000 mg | ORAL_TABLET | Freq: Every day | ORAL | 1 refills | Status: AC

## 2024-05-09 NOTE — Telephone Encounter (Signed)
 OK to stay on the citalopram  40 mg  Pleasea make sure  sig has corrected and refill if needed up to 6 mos

## 2024-05-09 NOTE — Telephone Encounter (Signed)
 Contacted pt and inform her of provider's message. Pt reports she ran out of Rx and can't get it fill cuz it is early as direction states 0.5 tablet and she is taking 1 tablet. New Rx is sent. Pt is aware.

## 2024-05-09 NOTE — Addendum Note (Signed)
 Addended by: Cyndi Montejano on: 05/09/2024 01:28 PM   Modules accepted: Orders

## 2024-05-11 ENCOUNTER — Other Ambulatory Visit: Payer: Self-pay | Admitting: Internal Medicine

## 2024-05-18 ENCOUNTER — Other Ambulatory Visit: Payer: Self-pay | Admitting: Internal Medicine

## 2024-05-24 ENCOUNTER — Ambulatory Visit: Admitting: Internal Medicine

## 2024-05-24 VITALS — BP 140/60 | HR 62 | Temp 97.9°F | Ht 65.0 in | Wt 160.8 lb

## 2024-05-24 DIAGNOSIS — K5080 Crohn's disease of both small and large intestine without complications: Secondary | ICD-10-CM

## 2024-05-24 DIAGNOSIS — Z79899 Other long term (current) drug therapy: Secondary | ICD-10-CM

## 2024-05-24 DIAGNOSIS — I739 Peripheral vascular disease, unspecified: Secondary | ICD-10-CM

## 2024-05-24 DIAGNOSIS — E039 Hypothyroidism, unspecified: Secondary | ICD-10-CM

## 2024-05-24 DIAGNOSIS — I10 Essential (primary) hypertension: Secondary | ICD-10-CM

## 2024-05-24 DIAGNOSIS — R208 Other disturbances of skin sensation: Secondary | ICD-10-CM

## 2024-05-24 LAB — TSH: TSH: 1.74 u[IU]/mL (ref 0.35–5.50)

## 2024-05-24 LAB — T4, FREE: Free T4: 0.73 ng/dL (ref 0.60–1.60)

## 2024-05-24 NOTE — Progress Notes (Unsigned)
 "  Chief Complaint  Patient presents with   Medical Management of Chronic Issues    Pt is here for her follow up.     HPI: Bridget Reyes 79 y.o. come in for fu multiple issues from last visit  Update  thyroid  taking low dose  has endo appt in may  daughter has thyroid  dx taking  low dose replacement   Pvd  evaluation   told by vasc specialist   to walk more  otpimize cv factors   but feet hurt from ? Neuropathy? Has appt with neuro about neuropathy sx   HT  gets 140 range at home  concern about diastolic  Hld  changesd from simva to crestor      is on amlo and intensification advised  ROS: See pertinent positives and negatives per HPI.  Past Medical History:  Diagnosis Date   Allergy    Anxiety    not treated    Arthritis    knees, neck - no meds   Blood transfusion without reported diagnosis    Cataract    bilateral - MD just watchning   Colon polyps    Crohn's    Diverticulosis    Eczema    Esophageal reflux    Gastritis    Gout    Hiatal hernia    Hyperlipemia    Hypertension    Ileitis    Migraines    tx with naproxen - last one 4 yrs ago   Rosacea    Skin cancer of face    scca removed     Family History  Problem Relation Age of Onset   Parkinsonism Mother    Lung cancer Mother    Heart disease Father        died chf in 26 s   Hypertension Father    Congestive Heart Failure Father    Colon polyps Sister    Breast cancer Daughter    Breast cancer Paternal Aunt    Breast cancer Cousin    Colon cancer Neg Hx    Esophageal cancer Neg Hx    Rectal cancer Neg Hx    Stomach cancer Neg Hx     Social History   Socioeconomic History   Marital status: Married    Spouse name: Not on file   Number of children: 2   Years of education: Not on file   Highest education level: Bachelor's degree (e.g., BA, AB, BS)  Occupational History   Occupation: retired     Comment: Advertising Account Executive: UNEMPLOYED  Tobacco Use   Smoking status: Never    Smokeless tobacco: Never  Vaping Use   Vaping status: Never Used  Substance and Sexual Activity   Alcohol use: Yes    Alcohol/week: 1.0 - 2.0 standard drink of alcohol    Types: 1 - 2 Glasses of wine per week   Drug use: No   Sexual activity: Yes    Partners: Male    Birth control/protection: Surgical    Comment: hysterectomy  Other Topics Concern   Not on file  Social History Narrative   hh  Of 2       Dog    Married    G2P2   Social Drivers of Health   Tobacco Use: Low Risk (05/24/2024)   Patient History    Smoking Tobacco Use: Never    Smokeless Tobacco Use: Never    Passive Exposure: Not on file  Financial Resource Strain: Low  Risk (05/24/2024)   Overall Financial Resource Strain (CARDIA)    Difficulty of Paying Living Expenses: Not hard at all  Food Insecurity: No Food Insecurity (05/24/2024)   Epic    Worried About Programme Researcher, Broadcasting/film/video in the Last Year: Never true    Ran Out of Food in the Last Year: Never true  Transportation Needs: No Transportation Needs (05/24/2024)   Epic    Lack of Transportation (Medical): No    Lack of Transportation (Non-Medical): No  Physical Activity: Sufficiently Active (05/24/2024)   Exercise Vital Sign    Days of Exercise per Week: 4 days    Minutes of Exercise per Session: 50 min  Stress: Stress Concern Present (05/24/2024)   [employer] of Occupational Health - Occupational Stress Questionnaire    Feeling of Stress: To some extent  Social Connections: Unknown (05/24/2024)   Social Connection and Isolation Panel    Frequency of Communication with Friends and Family: Not on file    Frequency of Social Gatherings with Friends and Family: Once a week    Attends Religious Services: More than 4 times per year    Active Member of Clubs or Organizations: Yes    Attends Banker Meetings: More than 4 times per year    Marital Status: Married  Depression (PHQ2-9): Low Risk (05/24/2024)   Depression (PHQ2-9)    PHQ-2 Score: 1   Alcohol Screen: Low Risk (05/24/2024)   Alcohol Screen    Last Alcohol Screening Score (AUDIT): 3  Housing: High Risk (05/24/2024)   Epic    Unable to Pay for Housing in the Last Year: No    Number of Times Moved in the Last Year: 2    Homeless in the Last Year: No  Utilities: Not At Risk (03/03/2023)   AHC Utilities    Threatened with loss of utilities: No  Health Literacy: Adequate Health Literacy (02/21/2024)   B1300 Health Literacy    Frequency of need for help with medical instructions: Never    Outpatient Medications Prior to Visit  Medication Sig Dispense Refill   acetaminophen (TYLENOL) 500 MG tablet Take 500 mg by mouth every 6 (six) hours as needed.     amLODipine  (NORVASC ) 5 MG tablet TAKE 1 TABLET(5 MG) BY MOUTH DAILY 90 tablet 1   citalopram  (CELEXA ) 40 MG tablet Take 1 tablet (40 mg total) by mouth daily. Dosage change 90 tablet 1   ipratropium (ATROVENT ) 0.06 % nasal spray USE 2 SPRAYS IN EACH NOSTRIL TWICE TO THREE TIMES DAILY. 45 mL prn   mesalamine  (PENTASA ) 500 MG CR capsule TAKE 3 CAPSULES BY MOUTH TWICE DAILY 540 capsule 2   Multiple Vitamins-Minerals (PRESERVISION AREDS 2 PO) Take 1 tablet by mouth in the morning and at bedtime.     omeprazole  (PRILOSEC) 40 MG capsule TAKE 1 CAPSULE(40 MG) BY MOUTH DAILY 30 MINUTES BEFORE BREAKFAST 90 capsule 1   OVER THE COUNTER MEDICATION in the morning and at bedtime. Focus Select for eye vitamins and supplements.     rosuvastatin  (CRESTOR ) 40 MG tablet Take 1 tablet (40 mg total) by mouth daily. 90 tablet 3   SYNTHROID  50 MCG tablet TAKE 1 TABLET BY MOUTH EVERY DAY 30 tablet 1   traZODone  (DESYREL ) 50 MG tablet TAKE 1/2 TO 1 TABLET(25 TO 50 MG) BY MOUTH AT BEDTIME AS NEEDED FOR SLEEP 30 tablet 1   valACYclovir  (VALTREX ) 1000 MG tablet Take 2 tablets (2,000 mg total) by mouth 2 (two) times daily as needed.  90 tablet 3   MULTIPLE VITAMIN PO Take 1 tablet by mouth daily. (Patient not taking: Reported on 05/08/2024)     NON FORMULARY  as needed. CBD Cream- applies prn to muscles, joints     No facility-administered medications prior to visit.     EXAM:  BP (!) 140/60 (BP Location: Left Arm, Patient Position: Sitting, Cuff Size: Normal)   Pulse 62   Temp 97.9 F (36.6 C) (Oral)   Ht 5' 5 (1.651 m)   Wt 160 lb 12.8 oz (72.9 kg)   LMP 04/19/1992   SpO2 98%   BMI 26.76 kg/m   Body mass index is 26.76 kg/m.  GENERAL: vitals reviewed and listed above, alert, oriented, appears well hydrated and in no acute distress HEENT: atraumatic, conjunctiva  clear, no obvious abnormalities on inspection of external nose and ears  LUNGS: clear to auscultation bilaterally, no wheezes, rales or rhonchi, good air movement CV: HRRR, no clubbing cyanosis onl cap refill  MS: moves all extremities without noticeable focal  abnormality PSYCH: pleasant and cooperative, no obvious depression or anxiety Lab Results  Component Value Date   WBC 5.3 02/15/2024   HGB 13.5 02/15/2024   HCT 39.9 02/15/2024   PLT 234.0 02/15/2024   GLUCOSE 87 02/15/2024   CHOL 157 02/15/2024   TRIG 125.0 02/15/2024   HDL 39.90 02/15/2024   LDLCALC 93 02/15/2024   ALT 18 02/15/2024   AST 17 02/15/2024   NA 137 02/15/2024   K 4.1 02/15/2024   CL 101 02/15/2024   CREATININE 0.80 02/15/2024   BUN 20 02/15/2024   CO2 30 02/15/2024   TSH 1.74 05/24/2024   HGBA1C 5.9 02/15/2024   MICROALBUR <0.7 02/15/2024   BP Readings from Last 3 Encounters:  05/24/24 (!) 140/60  05/08/24 139/65  04/23/24 (!) 148/73   Record review   ASSESSMENT AND PLAN:  Discussed the following assessment and plan:  Essential hypertension  Medication management - Plan: TSH, T4, free  PVD (peripheral vascular disease)  Hypothyroidism, unspecified type - Plan: TSH, T4, free  Burning sensation of feet  Crohn's disease of both small and large intestine without complication (HCC) - stable  Disc meds encourged to use helpful pharmacy teams about  meds and coordination IA  s  Disc  goals    bp  and if still not at goal at home  options discussed inc amlodipine  and or  add low dose valsartan  etc   Reviewed rational of  changing simva to crestor  ( med IA and intensity  of effect)  Has future appt with neuro and endo  Contac with clinical pharamcy if needed  -Patient advised to return or notify health care team  if  new concerns arise. 34  mint review visit counsel plan  Patient Instructions  Try  7.5 of amlodipine  for now  and see  what bp  is after  2-4 weeks  ( can send in readings ) if we stay on this dose I can send in 2.5 to add to the 5 mg amlodipine  .  Goal below average 130/80  Check tsh today .  And go from there .   Then  plan fu in 3 mos or so  virtual or other .     Casimir Barcellos K. Britlee Skolnik M.D. "

## 2024-05-24 NOTE — Patient Instructions (Signed)
 Try  7.5 of amlodipine  for now  and see  what bp  is after  2-4 weeks  ( can send in readings ) if we stay on this dose I can send in 2.5 to add to the 5 mg amlodipine  .  Goal below average 130/80  Check tsh today .  And go from there .   Then  plan fu in 3 mos or so  virtual or other .

## 2024-05-25 ENCOUNTER — Ambulatory Visit: Payer: Self-pay | Admitting: Internal Medicine

## 2024-05-25 MED ORDER — AMLODIPINE BESYLATE 5 MG PO TABS: 7.5000 mg | ORAL_TABLET | Freq: Every day | ORAL | 0 refills | Status: AC

## 2024-05-25 NOTE — Progress Notes (Signed)
 Thyroid  now in normal range . Continue same dose of medication

## 2024-06-05 ENCOUNTER — Ambulatory Visit: Admitting: Internal Medicine

## 2024-06-29 ENCOUNTER — Ambulatory Visit: Admitting: Diagnostic Neuroimaging

## 2024-07-03 ENCOUNTER — Ambulatory Visit: Admitting: Pharmacist

## 2024-09-03 ENCOUNTER — Ambulatory Visit: Admitting: "Endocrinology
